# Patient Record
Sex: Female | Born: 1995 | Hispanic: No | Marital: Married | Smoking: Never smoker
Health system: Southern US, Community
[De-identification: ages and names within clinical notes are randomized; demographics above are authoritative.]

## PROBLEM LIST (undated history)

## (undated) ENCOUNTER — Emergency Department (HOSPITAL_COMMUNITY): Admission: EM | Payer: Medicaid Other | Source: Home / Self Care

## (undated) DIAGNOSIS — Z789 Other specified health status: Secondary | ICD-10-CM

## (undated) DIAGNOSIS — K831 Obstruction of bile duct: Secondary | ICD-10-CM

## (undated) DIAGNOSIS — O26643 Intrahepatic cholestasis of pregnancy, third trimester: Secondary | ICD-10-CM

## (undated) DIAGNOSIS — O26613 Liver and biliary tract disorders in pregnancy, third trimester: Secondary | ICD-10-CM

## (undated) HISTORY — PX: FRACTURE SURGERY: SHX138

## (undated) HISTORY — DX: Liver and biliary tract disorders in pregnancy, third trimester: K83.1

## (undated) HISTORY — DX: Liver and biliary tract disorders in pregnancy, third trimester: O26.613

## (undated) HISTORY — PX: HAND SURGERY: SHX662

## (undated) HISTORY — DX: Intrahepatic cholestasis of pregnancy, third trimester: O26.643

## (undated) HISTORY — PX: OTHER SURGICAL HISTORY: SHX169

---

## 2020-05-11 DIAGNOSIS — Z419 Encounter for procedure for purposes other than remedying health state, unspecified: Secondary | ICD-10-CM | POA: Diagnosis not present

## 2020-05-20 ENCOUNTER — Other Ambulatory Visit: Payer: Self-pay

## 2020-05-20 ENCOUNTER — Inpatient Hospital Stay (HOSPITAL_COMMUNITY)
Admission: AD | Admit: 2020-05-20 | Discharge: 2020-05-20 | Disposition: A | Discharge status: Home / Self Care | Payer: Medicaid Other | Attending: Obstetrics and Gynecology | Admitting: Obstetrics and Gynecology

## 2020-05-20 ENCOUNTER — Encounter (HOSPITAL_COMMUNITY): Payer: Self-pay | Admitting: Obstetrics and Gynecology

## 2020-05-20 ENCOUNTER — Encounter: Payer: Self-pay | Admitting: Lactation Services

## 2020-05-20 DIAGNOSIS — R109 Unspecified abdominal pain: Secondary | ICD-10-CM | POA: Diagnosis not present

## 2020-05-20 DIAGNOSIS — R1030 Lower abdominal pain, unspecified: Secondary | ICD-10-CM

## 2020-05-20 DIAGNOSIS — Z3A00 Weeks of gestation of pregnancy not specified: Secondary | ICD-10-CM | POA: Diagnosis not present

## 2020-05-20 DIAGNOSIS — O0930 Supervision of pregnancy with insufficient antenatal care, unspecified trimester: Secondary | ICD-10-CM | POA: Insufficient documentation

## 2020-05-20 DIAGNOSIS — O09299 Supervision of pregnancy with other poor reproductive or obstetric history, unspecified trimester: Secondary | ICD-10-CM | POA: Insufficient documentation

## 2020-05-20 DIAGNOSIS — O26899 Other specified pregnancy related conditions, unspecified trimester: Secondary | ICD-10-CM | POA: Insufficient documentation

## 2020-05-20 HISTORY — DX: Other specified health status: Z78.9

## 2020-05-20 LAB — URINALYSIS, ROUTINE W REFLEX MICROSCOPIC
Bilirubin Urine: NEGATIVE
Glucose, UA: NEGATIVE mg/dL
Hgb urine dipstick: NEGATIVE
Ketones, ur: NEGATIVE mg/dL
Leukocytes,Ua: NEGATIVE
Nitrite: NEGATIVE
Protein, ur: NEGATIVE mg/dL
Specific Gravity, Urine: 1.02 (ref 1.005–1.030)
pH: 7 (ref 5.0–8.0)

## 2020-05-20 LAB — POCT PREGNANCY, URINE: Preg Test, Ur: POSITIVE — AB

## 2020-05-20 NOTE — MAU Provider Note (Signed)
Patient Diana Hopkins is a 24 y.o. [phone]  at unsure LMP for abdominal pain in the suprapubic area and lower back pain.  Patient is a recent immigrant to the [place] from [place]; Urdu interpreter and husband were used as interpreters. She denies vaginal bleeding, decreased fetal movements, LOF, vaginal discharge, contractions.    She reports that she is not sure how far along she is.  Patient and her husband would like a detailed anatomy scan today.  History     CSN: [id number]  Arrival date and time: 05/20/20 1010   Event Date/Time   First Provider Initiated Contact with Patient 05/20/20 1248      Chief Complaint  Patient presents with  . Abdominal Pain    Lower abdomen    Abdominal Pain This is a new problem. The current episode started yesterday. The problem occurs intermittently. The pain is located in the LLQ and RLQ. The pain is at a severity of 4/10. The quality of the pain is cramping. Pertinent negatives include no dysuria.  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 Her miscarriages were at 4 months and 5 months.  The pain lasted last night from 6 to 11 pm; it is much improved today.  OB History    Gravida  6   Para  2   Term  2   Preterm      AB  3   Living  2     SAB  3   IAB      Ectopic      Multiple      Live Births  2           Past Medical History:  Diagnosis Date  . Medical history non-contributory     Past Surgical History:  Procedure Laterality Date  . arm surgery      History reviewed. No pertinent family history.     Allergies: No Known Allergies  No medications prior to admission.    Review of Systems  Constitutional: Negative.   HENT: Negative.   Gastrointestinal: Positive for abdominal pain.  Genitourinary: Negative for dyspareunia, dysuria, vaginal bleeding, vaginal discharge and vaginal pain.  Musculoskeletal: Negative.  Negative for back pain.  Neurological: Negative.    Psychiatric/Behavioral: Negative.    Physical Exam   Blood pressure (!) 94/49, pulse 64, temperature (!) 97.5 F (36.4 C), temperature source Oral, resp. rate 16, height 5\' 2"  (1.575 m), weight 70.9 kg, SpO2 98 %.  Physical Exam Constitutional:      Appearance: She is well-developed.  Cardiovascular:     Rate and Rhythm: Normal rate.  Abdominal:     General: Abdomen is flat.     Palpations: Abdomen is soft.  Genitourinary:    Vagina: Normal.     Cervix: No cervical motion tenderness.  Neurological:     Mental Status: She is alert.   Cervix is long, closed, posterior.   MAU Course  Procedures  MDM -Long conversation with husband and patient with interpreter about normal pain in pregnancy. Explained warning signs to patient and what to expect in pregnancy.  -UA is normal -Patient declined other testing while in MAU  -FHR is 134 by Doppler and cardiac activity was visible on .  Pt informed that the ultrasound is considered a limited OB ultrasound and is not intended to be a complete ultrasound exam.  Patient also informed that the ultrasound is not being completed with the intent of assessing  for fetal or placental anomalies or any pelvic abnormalities.  Explained that the purpose of today's ultrasound is to assess for  viability.  Patient acknowledges the purpose of the exam and the limitations of the study.     Assessment and Plan   1. Lower abdominal pain    2. Patient stable for discharge with plan to keep NOB appointment on Monday. Reviewed that patient will have a female provider for NOB; patient is ok with that as long as no physical contact between MD and patient.   3. Recommended that patient bring all of her documents on Monday to appointment; explained that patient will have her anatomy scan scheduled at that visit.   Diana Hopkins 05/22/2020, 1:12 PM

## 2020-05-20 NOTE — MAU Note (Signed)
PT stated 20 weeks. Found prenatal records which show [facility] 08/23/2020. Did not do NST, but did perform bedside U/S.

## 2020-05-20 NOTE — MAU Note (Signed)
Provider performed bedside U/S.

## 2020-05-20 NOTE — MAU Note (Addendum)
Pt c/o lower abdominal and back pain last night. Pt states she is [redacted] wks pregnant. FHT via doppler - 134. [place] arrived [place] country. Has not established prenatal care. Hx of  3 SAB. States pain is the same as with the miscarriages and around the same time.  Denies bleeding or LOF.  Language barrier d/t lack of interpreter for desired language. Using husband to interpret.

## 2020-05-23 ENCOUNTER — Encounter: Payer: Self-pay | Admitting: Obstetrics and Gynecology

## 2020-05-23 ENCOUNTER — Ambulatory Visit (INDEPENDENT_AMBULATORY_CARE_PROVIDER_SITE_OTHER): Payer: Medicaid Other | Admitting: Obstetrics and Gynecology

## 2020-05-23 ENCOUNTER — Other Ambulatory Visit: Payer: Self-pay

## 2020-05-23 DIAGNOSIS — Z9889 Other specified postprocedural states: Secondary | ICD-10-CM

## 2020-05-23 DIAGNOSIS — O0990 Supervision of high risk pregnancy, unspecified, unspecified trimester: Secondary | ICD-10-CM

## 2020-05-23 DIAGNOSIS — R7612 Nonspecific reaction to cell mediated immunity measurement of gamma interferon antigen response without active tuberculosis: Secondary | ICD-10-CM | POA: Insufficient documentation

## 2020-05-23 NOTE — Patient Instructions (Signed)

## 2020-05-23 NOTE — Progress Notes (Signed)
Subjective:  Diana Hopkins is a 24 y.o. [id number] at [redacted]w[redacted]d being seen today for her first OB appt. EDD by first trimester U/S. Limited prenatal care, immigrant from [place] TSVD x 2 without problems. 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  Patient reports no complaints.  Contractions: Not present. Vag. Bleeding: None.  Movement: (!) Decreased. Denies leaking of fluid.   The following portions of the patient's history were reviewed and updated as appropriate: allergies, current medications, past family history, past medical history, past social history, past surgical history and problem list. Problem list updated.  Objective:   Vitals:   05/23/20 1005  BP: 110/62  Pulse: 85  Weight: 156 lb 6.4 oz (70.9 kg)    Fetal Status: Fetal Heart Rate (bpm): 147   Movement: (!) Decreased     General:  Alert, oriented and cooperative. Patient is in no acute distress.  Skin: Skin is warm and dry. No rash noted.   Cardiovascular: Normal heart rate noted  Respiratory: Normal respiratory effort, no problems with respiration noted  Abdomen: Soft, gravid, appropriate for gestational age. Pain/Pressure: Present     Pelvic:  Cervical exam deferred        Extremities: Normal range of motion.  Edema: Trace  Mental Status: Normal mood and affect. Normal behavior. Normal judgment and thought content.   Urinalysis:      Assessment and Plan:  Pregnancy: [id number] at [redacted]w[redacted]d  1. Supervision of high risk pregnancy, antepartum Prenatal care and labs reviewed with pt. - [place] MFM OB DETAIL +14 WK; Future - CBC/D/Plt+RPR+Rh+ABO+Rub Ab... - Culture, OB Urine - Hemoglobin A1c - CBC - HIV Antibody (routine testing w rflx) - RPR  2. 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 H/O hand surgery Stable  Video interrupter used during today's visit.  Preterm labor symptoms and general obstetric precautions including but not limited to vaginal bleeding, contractions, leaking of fluid and fetal movement were reviewed in detail with the patient. Please refer to After Visit Summary for other counseling recommendations.  Return in about 2 weeks (around 06/06/2020) for OB visit, face to face, any provider, gluocla also.   Hermina Staggers, MD

## 2020-05-24 ENCOUNTER — Other Ambulatory Visit: Payer: Self-pay | Admitting: *Deleted

## 2020-05-24 DIAGNOSIS — O0990 Supervision of high risk pregnancy, unspecified, unspecified trimester: Secondary | ICD-10-CM

## 2020-05-24 LAB — CBC/D/PLT+RPR+RH+ABO+RUB AB...
Antibody Screen: NEGATIVE
Basophils Absolute: 0 10*3/uL (ref 0.0–0.2)
Basos: 1 %
EOS (ABSOLUTE): 0.2 10*3/uL (ref 0.0–0.4)
Eos: 2 %
HCV Ab: <0.1 s/co ratio (ref 0.0–0.9)
HIV Screen 4th Generation wRfx: NONREACTIVE
Hematocrit: 33.3 % — ABNORMAL LOW (ref 34.0–46.6)
Hemoglobin: 11.6 g/dL (ref 11.1–15.9)
Hepatitis B Surface Ag: NEGATIVE
Immature Grans (Abs): 0 10*3/uL (ref 0.0–0.1)
Immature Granulocytes: 1 %
Lymphocytes Absolute: 1.3 10*3/uL (ref 0.7–3.1)
Lymphs: 20 %
MCH: 30.8 pg (ref 26.6–33.0)
MCHC: 34.8 g/dL (ref 31.5–35.7)
MCV: 88 fL (ref 79–97)
Monocytes Absolute: 0.3 10*3/uL (ref 0.1–0.9)
Monocytes: 5 %
Neutrophils Absolute: 4.7 10*3/uL (ref 1.4–7.0)
Neutrophils: 71 %
Platelets: 187 10*3/uL (ref 150–450)
RBC: 3.77 x10E6/uL (ref 3.77–5.28)
RDW: 14.3 % (ref 11.7–15.4)
RPR Ser Ql: NONREACTIVE
Rh Factor: POSITIVE
Rubella Antibodies, IGG: 2.99 index (ref >0.99)
WBC: 6.5 10*3/uL (ref 3.4–10.8)

## 2020-05-24 LAB — HEMOGLOBIN A1C
Est. average glucose Bld gHb Est-mCnc: 91 mg/dL
Hgb A1c MFr Bld: 4.8 % (ref 4.8–5.6)

## 2020-05-24 LAB — HCV INTERPRETATION

## 2020-05-26 LAB — URINE CULTURE, OB REFLEX

## 2020-05-26 LAB — CULTURE, OB URINE

## 2020-05-30 ENCOUNTER — Other Ambulatory Visit: Payer: Self-pay

## 2020-05-30 ENCOUNTER — Encounter: Payer: Self-pay | Admitting: Obstetrics and Gynecology

## 2020-05-30 ENCOUNTER — Other Ambulatory Visit: Payer: Medicaid Other

## 2020-05-30 ENCOUNTER — Telehealth: Payer: Self-pay

## 2020-05-30 DIAGNOSIS — O2343 Unspecified infection of urinary tract in pregnancy, third trimester: Secondary | ICD-10-CM

## 2020-05-30 DIAGNOSIS — O0990 Supervision of high risk pregnancy, unspecified, unspecified trimester: Secondary | ICD-10-CM | POA: Diagnosis not present

## 2020-05-30 DIAGNOSIS — O2340 Unspecified infection of urinary tract in pregnancy, unspecified trimester: Secondary | ICD-10-CM | POA: Insufficient documentation

## 2020-05-30 NOTE — Telephone Encounter (Signed)
Called Pt using [facility] id # [phone], to give pt test results & get Pharmacy information, husband answered, stating will not be back home until another hour. Asked if wife could call us back in the am after 8am. Husband verbalized understanding.

## 2020-05-31 ENCOUNTER — Telehealth: Payer: Self-pay | Admitting: *Deleted

## 2020-05-31 LAB — GLUCOSE TOLERANCE, 2 HOURS W/ 1HR
Glucose, 1 hour: 100 mg/dL (ref 65–179)
Glucose, 2 hour: 85 mg/dL (ref 65–152)
Glucose, Fasting: 70 mg/dL (ref 65–91)

## 2020-05-31 NOTE — Telephone Encounter (Addendum)
Voicemail message left by friend of pt [phone]). She stated that someone from the office had talked with pt's husband yesterday and was supposed to call him back today @ 0800. He has not received the call. She stated we can call him or the pt. I attempted call back to pt with requested service from [facility] interpreter however a [name] was available at the time.   12/22  1610 Called pt w/[employer] interpreter # [id number] and informed her of normal 2hr GTT results. Pt stated that she has increased lower abdominal and back pain. I advised taking Tylenol or using a heating pad to her back. She may discuss further during her next office visit on 12/28. I also advised pt that she may sign a release at her next visit to give [place] permission to speak to her husband about medical information if desired. Pt voiced understanding of all information and instructions given.

## 2020-06-07 ENCOUNTER — Encounter: Payer: Self-pay | Admitting: Obstetrics and Gynecology

## 2020-06-07 ENCOUNTER — Ambulatory Visit (INDEPENDENT_AMBULATORY_CARE_PROVIDER_SITE_OTHER): Payer: Medicaid Other | Admitting: Obstetrics and Gynecology

## 2020-06-07 ENCOUNTER — Other Ambulatory Visit: Payer: Self-pay

## 2020-06-07 VITALS — BP 96/71 | HR 87 | Wt 158.6 lb

## 2020-06-07 DIAGNOSIS — O0933 Supervision of pregnancy with insufficient antenatal care, third trimester: Secondary | ICD-10-CM

## 2020-06-07 DIAGNOSIS — R7612 Nonspecific reaction to cell mediated immunity measurement of gamma interferon antigen response without active tuberculosis: Secondary | ICD-10-CM

## 2020-06-07 DIAGNOSIS — Z3A29 29 weeks gestation of pregnancy: Secondary | ICD-10-CM

## 2020-06-07 DIAGNOSIS — O0990 Supervision of high risk pregnancy, unspecified, unspecified trimester: Secondary | ICD-10-CM

## 2020-06-07 DIAGNOSIS — O2343 Unspecified infection of urinary tract in pregnancy, third trimester: Secondary | ICD-10-CM

## 2020-06-07 DIAGNOSIS — B3731 Acute candidiasis of vulva and vagina: Secondary | ICD-10-CM

## 2020-06-07 DIAGNOSIS — B373 Candidiasis of vulva and vagina: Secondary | ICD-10-CM

## 2020-06-07 MED ORDER — PRENATAL VITAMINS 28-0.8 MG PO TABS: 1.0000 | ORAL_TABLET | Freq: Every day | ORAL | 2 refills | Status: DC

## 2020-06-07 MED ORDER — MICONAZOLE NITRATE 2 % VA CREA: 1.0000 | TOPICAL_CREAM | Freq: Every day | VAGINAL | 0 refills | Status: DC

## 2020-06-07 MED ORDER — CEPHALEXIN 500 MG PO CAPS: 500.0000 mg | ORAL_CAPSULE | Freq: Four times a day (QID) | ORAL | 0 refills | Status: AC

## 2020-06-07 MED ORDER — ACETAMINOPHEN ER 650 MG PO TBCR: 650.0000 mg | EXTENDED_RELEASE_TABLET | Freq: Four times a day (QID) | ORAL | 0 refills | Status: DC | PRN

## 2020-06-07 NOTE — Progress Notes (Signed)
PRENATAL VISIT NOTE  Subjective:  Diana Hopkins is a 24 y.o. [phone] at [redacted]w[redacted]d being seen today for ongoing prenatal care.  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  Patient reports dysuria. Positive urine culture on 12/13 but pt never received treatment. Contractions: Not present. Vag. Bleeding: None.  Movement: Present. Denies leaking of fluid. H/o 2 vaginal deliveries in the past. Prefers female providers. Pt also endorses significant anxiety given h/o 2 prior miscarriages.  The following portions of the patient's history were reviewed and updated as appropriate: allergies, current medications, past family history, past medical history, past social history, past surgical history and problem list.  In person [name] for entirety of clinic visit.  Objective:   Vitals:   06/07/20 1613  BP: 96/71  Pulse: 87  Weight: 158 lb 9.6 oz (71.9 kg)    Fetal Status: Fetal Heart Rate (bpm): 154 Fundal Height: 27 cm Movement: Present     General:  Alert, oriented and cooperative. Patient is in no acute distress.  Skin: Skin is warm and dry. No rash noted.   Cardiovascular: Normal heart rate noted  Respiratory: Normal respiratory effort, no problems with respiration noted  Abdomen: Soft, gravid, appropriate for gestational age.  Pain/Pressure: Present     Pelvic: Cervical exam deferred        Extremities: Normal range of motion.  Edema: Trace  Mental Status: Normal mood and affect. Normal behavior. Normal judgment and thought content.   Assessment and Plan:  Pregnancy: [id number] at [redacted]w[redacted]d 1. [redacted] weeks gestation of pregnancy, Supervision of High-Risk Pregnancy: No red flag signs/symptoms. +FHT and +FM today. S/p  Tdap and flu vaccines. Normal 2hr gtt and third trimester labs.  - plan for ultrasound on 06/21/20 (pt aware and discussed with interpreter today) - provided reassurance given h/o 2 miscarriages and reported anxiety regarding baby's wellbeing - provided information re: kick counts given prior MAU visit for decreased fetal movement - rtc in 2 week for f/u prenatal appt or sooner as needed for return precautions as noted below  2. Urinary tract infection in mother during third trimester of pregnancy: Pt with positive urine culture (pan-sensitive staph epi) on 05/23/20. Report of worsening dysuria today. No fever, back pain or other systemic symptoms. Message sent to nursing, but pt never treated. - script for keflex 500mg  QID x5d - plan for TOC 1 week s/p completion of abx course  3. 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 No concerning signs/symptoms today.  4. Late prenatal care  Refugee status: Presented in third trimester given recent immigration to the from [place]. Immigrated with her husband. [name] present for clinic visit today.  Preterm labor symptoms and general obstetric precautions including but not limited to vaginal bleeding, contractions, leaking of fluid and fetal movement were reviewed in detail with the patient. Please refer to After Visit Summary for other counseling recommendations.   Return in about 2 weeks (around 06/21/2020) for f/u prenatal appt in person (prefers female provider).  Future Appointments  Date Time Provider Department Center  06/21/2020  8:45 AM [facility] NURSE [facility]  06/21/2020  9:00 AM [facility] US1 [facility]  06/21/2020 10:35 AM 08/19/2020, Magnus Sinning, PA-C [facility]   Diana Hopkins, [facility] MD OB Fellow, Faculty Practice  06/08/2020 3:14 PM

## 2020-06-07 NOTE — Patient Instructions (Addendum)
Take keflex (cephalaxin 1 tablet every 6 hours for 5 days).  Third Trimester of Pregnancy The third trimester is from week 28 through week 40 (months 7 through 9). The third trimester is a time when the unborn baby (fetus) is growing rapidly. At the end of the ninth month, the fetus is about 20 inches in length and weighs 6-10 pounds. Body changes during your third trimester Your body will continue to go through many changes during pregnancy. The changes vary from woman to woman. During the third trimester:  Your weight will continue to increase. You can expect to gain 25-35 pounds (11-16 kg) by the end of the pregnancy.  You may begin to get stretch marks on your hips, abdomen, and breasts.  You may urinate more often because the fetus is moving lower into your pelvis and pressing on your bladder.  You may develop or continue to have heartburn. This is caused by increased hormones that slow down muscles in the digestive tract.  You may develop or continue to have constipation because increased hormones slow digestion and cause the muscles that push waste through your intestines to relax.  You may develop hemorrhoids. These are swollen veins (varicose veins) in the rectum that can itch or be painful.  You may develop swollen, bulging veins (varicose veins) in your legs.  You may have increased body aches in the pelvis, back, or thighs. This is due to weight gain and increased hormones that are relaxing your joints.  You may have changes in your hair. These can include thickening of your hair, rapid growth, and changes in texture. Some women also have hair loss during or after pregnancy, or hair that feels dry or thin. Your hair will most likely return to normal after your baby is born.  Your breasts will continue to grow and they will continue to become tender. A yellow fluid (colostrum) may leak from your breasts. This is the first milk you are producing for your baby.  Your belly button  may stick out.  You may notice more swelling in your hands, face, or ankles.  You may have increased tingling or numbness in your hands, arms, and legs. The skin on your belly may also feel numb.  You may feel short of breath because of your expanding uterus.  You may have more problems sleeping. This can be caused by the size of your belly, increased need to urinate, and an increase in your body's metabolism.  You may notice the fetus "dropping," or moving lower in your abdomen (lightening).  You may have increased vaginal discharge.  You may notice your joints feel loose and you may have pain around your pelvic bone. What to expect at prenatal visits You will have prenatal exams every 2 weeks until week 36. Then you will have weekly prenatal exams. During a routine prenatal visit:  You will be weighed to make sure you and the baby are growing normally.  Your blood pressure will be taken.  Your abdomen will be measured to track your baby's growth.  The fetal heartbeat will be listened to.  Any test results from the previous visit will be discussed.  You may have a cervical check near your due date to see if your cervix has softened or thinned (effaced).  You will be tested for Group B streptococcus. This happens between 35 and 37 weeks. Your health care provider may ask you:  What your birth plan is.  How you are feeling.  If you  are feeling the baby move.  If you have had any abnormal symptoms, such as leaking fluid, bleeding, severe headaches, or abdominal cramping.  If you are using any tobacco products, including cigarettes, chewing tobacco, and electronic cigarettes.  If you have any questions. Other tests or screenings that may be performed during your third trimester include:  Blood tests that check for low iron levels (anemia).  Fetal testing to check the health, activity level, and growth of the fetus. Testing is done if you have certain medical conditions or  if there are problems during the pregnancy.  Nonstress test (NST). This test checks the health of your baby to make sure there are no signs of problems, such as the baby not getting enough oxygen. During this test, a belt is placed around your belly. The baby is made to move, and its heart rate is monitored during movement. What is false labor? False labor is a condition in which you feel small, irregular tightenings of the muscles in the womb (contractions) that usually go away with rest, changing position, or drinking water. These are called Braxton Hicks contractions. Contractions may last for hours, days, or even weeks before true labor sets in. If contractions come at regular intervals, become more frequent, increase in intensity, or become painful, you should see your health care provider. What are the signs of labor?  Abdominal cramps.  Regular contractions that start at 10 minutes apart and become stronger and more frequent with time.  Contractions that start on the top of the uterus and spread down to the lower abdomen and back.  Increased pelvic pressure and dull back pain.  A watery or bloody mucus discharge that comes from the vagina.  Leaking of amniotic fluid. This is also known as your "water breaking." It could be a slow trickle or a gush. Let your health care provider know if it has a color or strange odor. If you have any of these signs, call your health care provider right away, even if it is before your due date. Follow these instructions at home: Medicines  Follow your health care provider's instructions regarding medicine use. Specific medicines may be either safe or unsafe to take during pregnancy.  Take a prenatal vitamin that contains at least 600 micrograms (mcg) of folic acid.  If you develop constipation, try taking a stool softener if your health care provider approves. Eating and drinking   Eat a balanced diet that includes fresh fruits and vegetables,  whole grains, good sources of protein such as meat, eggs, or tofu, and low-fat dairy. Your health care provider will help you determine the amount of weight gain that is right for you.  Avoid raw meat and uncooked cheese. These carry germs that can cause birth defects in the baby.  If you have low calcium intake from food, talk to your health care provider about whether you should take a daily calcium supplement.  Eat four or five small meals rather than three large meals a day.  Limit foods that are high in fat and processed sugars, such as fried and sweet foods.  To prevent constipation: ? Drink enough fluid to keep your urine clear or pale yellow. ? Eat foods that are high in fiber, such as fresh fruits and vegetables, whole grains, and beans. Activity  Exercise only as directed by your health care provider. Most women can continue their usual exercise routine during pregnancy. Try to exercise for 30 minutes at least 5 days a week. Stop  exercising if you experience uterine contractions.  Avoid heavy lifting.  Do not exercise in extreme heat or humidity, or at high altitudes.  Wear low-heel, comfortable shoes.  Practice good posture.  You may continue to have sex unless your health care provider tells you otherwise. Relieving pain and discomfort  Take frequent breaks and rest with your legs elevated if you have leg cramps or low back pain.  Take warm sitz baths to soothe any pain or discomfort caused by hemorrhoids. Use hemorrhoid cream if your health care provider approves.  Wear a good support bra to prevent discomfort from breast tenderness.  If you develop varicose veins: ? Wear support pantyhose or compression stockings as told by your healthcare provider. ? Elevate your feet for 15 minutes, 3-4 times a day. Prenatal care  Write down your questions. Take them to your prenatal visits.  Keep all your prenatal visits as told by your health care provider. This is  important. Safety  Wear your seat belt at all times when driving.  Make a list of emergency phone numbers, including numbers for family, friends, the hospital, and police and fire departments. General instructions  Avoid cat litter boxes and soil used by cats. These carry germs that can cause birth defects in the baby. If you have a cat, ask someone to clean the litter box for you.  Do not travel far distances unless it is absolutely necessary and only with the approval of your health care provider.  Do not use hot tubs, steam rooms, or saunas.  Do not drink alcohol.  Do not use any products that contain nicotine or tobacco, such as cigarettes and e-cigarettes. If you need help quitting, ask your health care provider.  Do not use any medicinal herbs or unprescribed drugs. These chemicals affect the formation and growth of the baby.  Do not douche or use tampons or scented sanitary pads.  Do not cross your legs for long periods of time.  To prepare for the arrival of your baby: ? Take prenatal classes to understand, practice, and ask questions about labor and delivery. ? Make a trial run to the hospital. ? Visit the hospital and tour the maternity area. ? Arrange for maternity or paternity leave through employers. ? Arrange for family and friends to take care of pets while you are in the hospital. ? Purchase a rear-facing car seat and make sure you know how to install it in your car. ? Pack your hospital bag. ? Prepare the baby's nursery. Make sure to remove all pillows and stuffed animals from the baby's crib to prevent suffocation.  Visit your dentist if you have not gone during your pregnancy. Use a soft toothbrush to brush your teeth and be gentle when you floss. Contact a health care provider if:  You are unsure if you are in labor or if your water has broken.  You become dizzy.  You have mild pelvic cramps, pelvic pressure, or nagging pain in your abdominal area.  You  have lower back pain.  You have persistent nausea, vomiting, or diarrhea.  You have an unusual or bad smelling vaginal discharge.  You have pain when you urinate. Get help right away if:  Your water breaks before 37 weeks.  You have regular contractions less than 5 minutes apart before 37 weeks.  You have a fever.  You are leaking fluid from your vagina.  You have spotting or bleeding from your vagina.  You have severe abdominal pain or cramping.  You have rapid weight loss or weight gain.  You have shortness of breath with chest pain.  You notice sudden or extreme swelling of your face, hands, ankles, feet, or legs.  Your baby makes fewer than 10 movements in 2 hours.  You have severe headaches that do not go away when you take medicine.  You have vision changes. Summary  The third trimester is from week 28 through week 40, months 7 through 9. The third trimester is a time when the unborn baby (fetus) is growing rapidly.  During the third trimester, your discomfort may increase as you and your baby continue to gain weight. You may have abdominal, leg, and back pain, sleeping problems, and an increased need to urinate.  During the third trimester your breasts will keep growing and they will continue to become tender. A yellow fluid (colostrum) may leak from your breasts. This is the first milk you are producing for your baby.  False labor is a condition in which you feel small, irregular tightenings of the muscles in the womb (contractions) that eventually go away. These are called Braxton Hicks contractions. Contractions may last for hours, days, or even weeks before true labor sets in.  Signs of labor can include: abdominal cramps; regular contractions that start at 10 minutes apart and become stronger and more frequent with time; watery or bloody mucus discharge that comes from the vagina; increased pelvic pressure and dull back pain; and leaking of amniotic fluid. This  information is not intended to replace advice given to you by your health care provider. Make sure you discuss any questions you have with your health care provider. Document Revised: 09/18/2018 Document Reviewed: 07/03/2016 Elsevier Patient Education  2020 [employer].

## 2020-06-08 DIAGNOSIS — O0932 Supervision of pregnancy with insufficient antenatal care, second trimester: Secondary | ICD-10-CM | POA: Insufficient documentation

## 2020-06-11 DIAGNOSIS — Z419 Encounter for procedure for purposes other than remedying health state, unspecified: Secondary | ICD-10-CM | POA: Diagnosis not present

## 2020-06-11 NOTE — L&D Delivery Note (Signed)
Delivery Note Called to bedside and patient complete and pushing. At 7:04 AM a viable female was delivered via Vaginal, Spontaneous (Presentation: Left Occiput Anterior).  APGAR: 8, 9; weight pending. No nuchal cord noted Placenta status: Spontaneous, Intact.  Cord: 3 vessels with the following complications: None.   Anesthesia: Epidural Episiotomy: None Lacerations: 2nd degree Suture Repair: 3.0 vicryl Est. Blood Loss (mL): 142  Mom to postpartum.  Baby to Couplet care / Skin to Skin.  Diana Hopkins 09/30/2020, 8:09 AM

## 2020-06-21 ENCOUNTER — Encounter: Payer: Self-pay | Admitting: *Deleted

## 2020-06-21 ENCOUNTER — Other Ambulatory Visit: Payer: Self-pay

## 2020-06-21 ENCOUNTER — Other Ambulatory Visit: Payer: Self-pay | Admitting: *Deleted

## 2020-06-21 ENCOUNTER — Encounter: Payer: Self-pay | Admitting: Medical

## 2020-06-21 ENCOUNTER — Ambulatory Visit (INDEPENDENT_AMBULATORY_CARE_PROVIDER_SITE_OTHER): Payer: Medicaid Other | Admitting: Medical

## 2020-06-21 ENCOUNTER — Ambulatory Visit: Payer: Medicaid Other | Admitting: *Deleted

## 2020-06-21 ENCOUNTER — Ambulatory Visit: Payer: Medicaid Other | Attending: Obstetrics and Gynecology

## 2020-06-21 VITALS — BP 93/48 | HR 72 | Wt 160.7 lb

## 2020-06-21 DIAGNOSIS — M549 Dorsalgia, unspecified: Secondary | ICD-10-CM

## 2020-06-21 DIAGNOSIS — O0933 Supervision of pregnancy with insufficient antenatal care, third trimester: Secondary | ICD-10-CM

## 2020-06-21 DIAGNOSIS — O99891 Other specified diseases and conditions complicating pregnancy: Secondary | ICD-10-CM

## 2020-06-21 DIAGNOSIS — O0990 Supervision of high risk pregnancy, unspecified, unspecified trimester: Secondary | ICD-10-CM | POA: Diagnosis not present

## 2020-06-21 DIAGNOSIS — L299 Pruritus, unspecified: Secondary | ICD-10-CM

## 2020-06-21 DIAGNOSIS — Z3A22 22 weeks gestation of pregnancy: Secondary | ICD-10-CM

## 2020-06-21 DIAGNOSIS — O2343 Unspecified infection of urinary tract in pregnancy, third trimester: Secondary | ICD-10-CM

## 2020-06-21 DIAGNOSIS — Z3492 Encounter for supervision of normal pregnancy, unspecified, second trimester: Secondary | ICD-10-CM

## 2020-06-21 IMAGING — US US MFM OB DETAIL+14 WK
1 series · 13 of 28 positions shown · non-contrast
Comparison: none

[Series 1: us mfm ob detail+14 wk · 59 acquisitions, 13 frames shown]
[im 3/59]
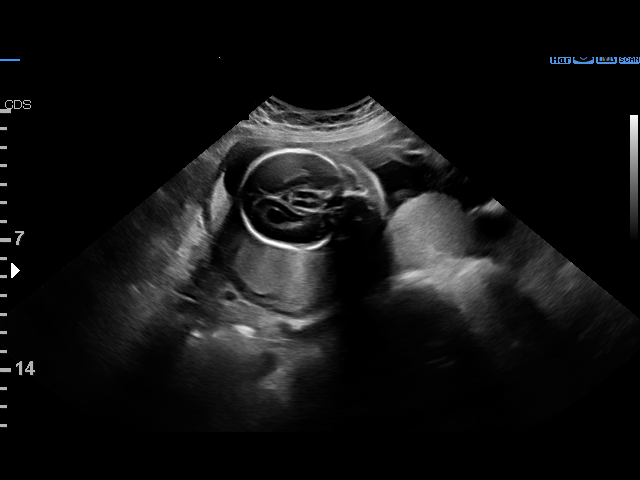
[im 7/59]
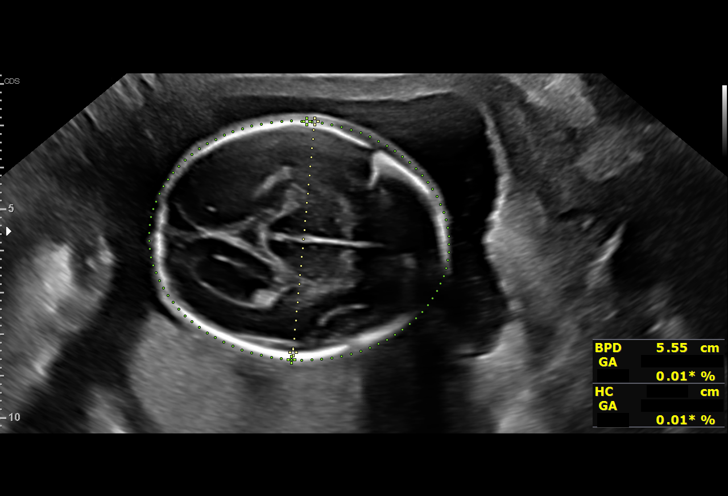
[im 11/59]
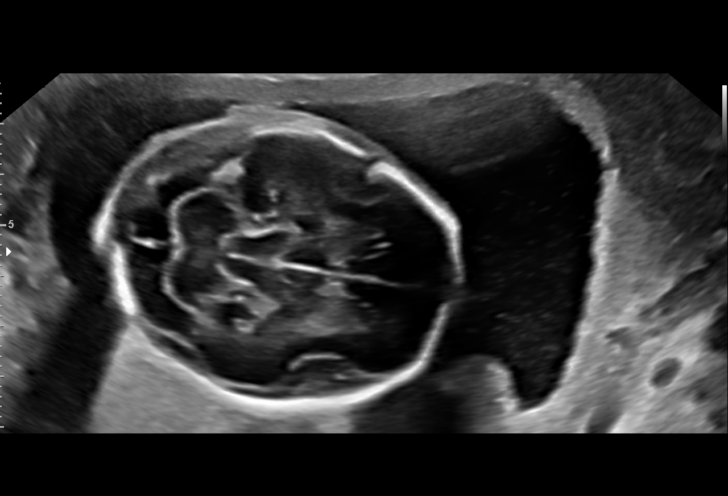
[im 16/59]
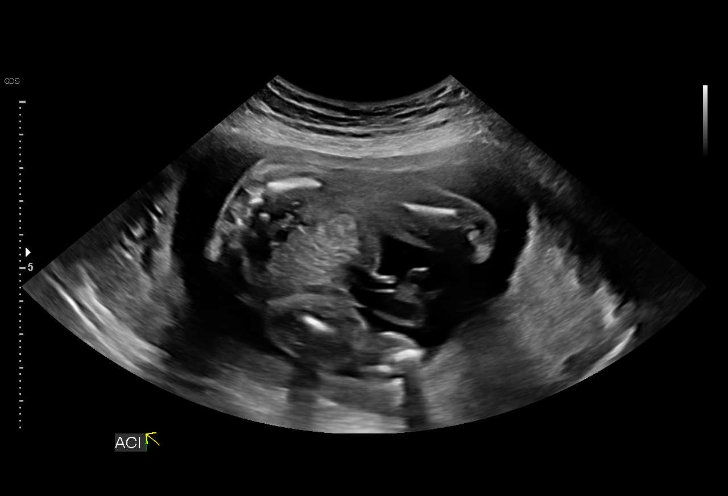
[im 20/59]
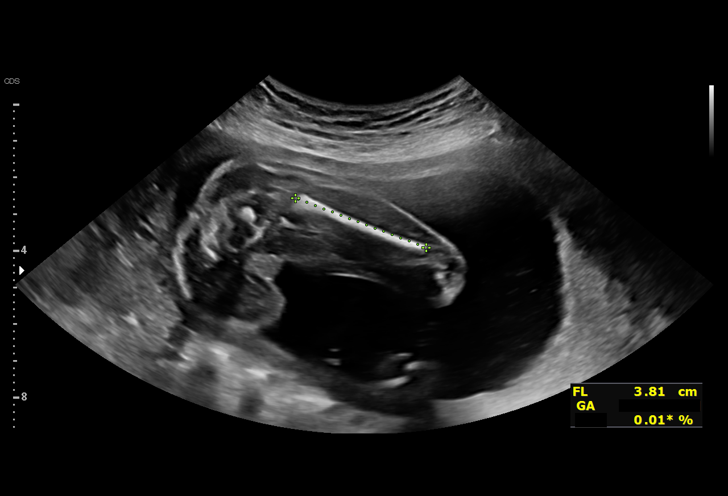
[im 24/59]
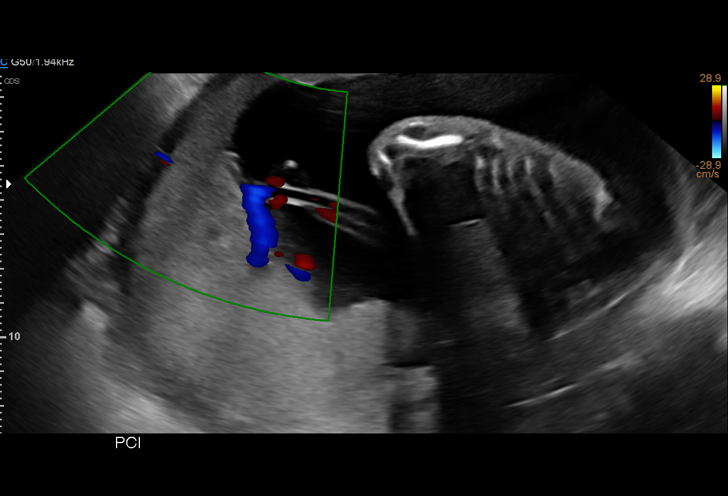
[im 31/59]
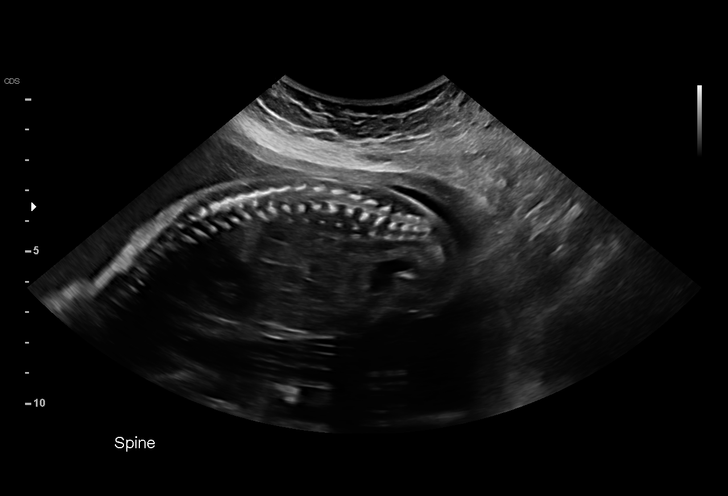
[im 35/59]
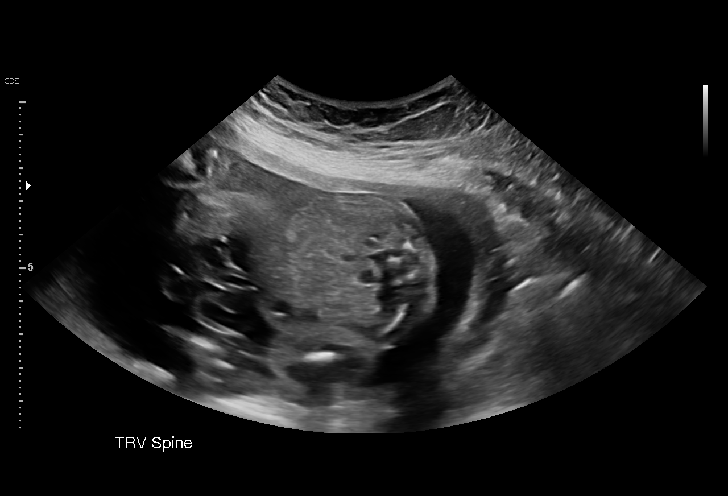
[im 39/59]
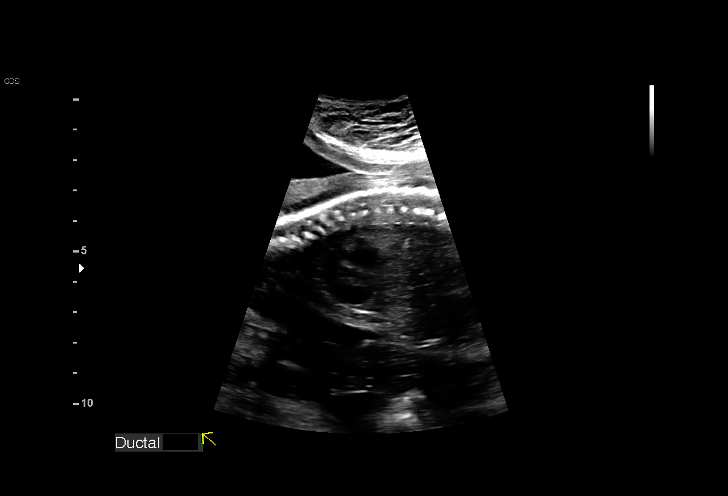
[im 43/59]
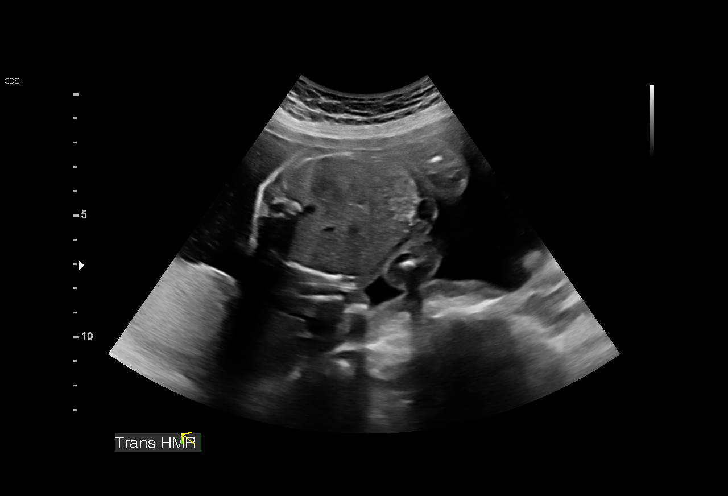
[im 48/59]
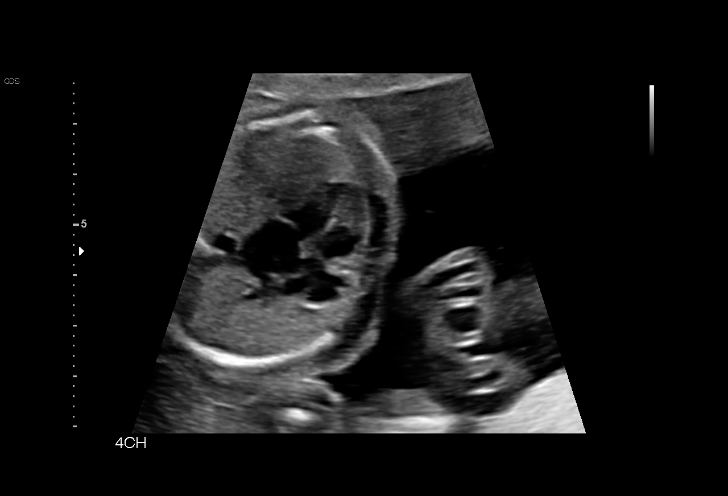
[im 52/59]
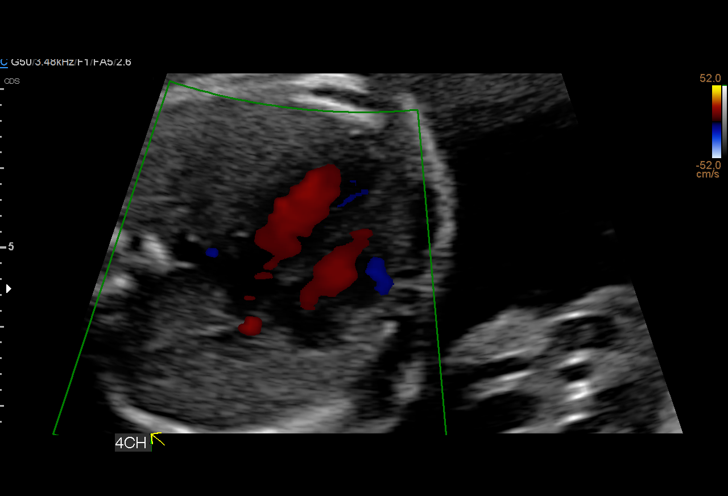
[im 56/59]
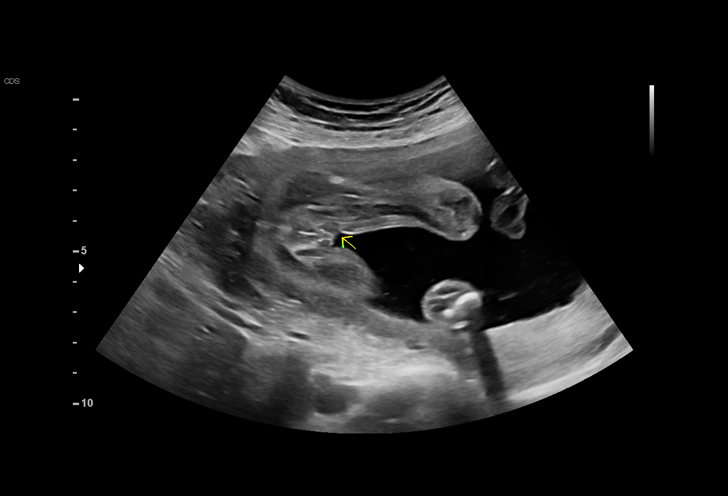

[13 of 28 positions shown; findings below may reference images not displayed]

Indications

 Fetal abnormality - other known or suspected   [GU]
 Pregnancy with inconclusive fetal viability    [GU]
 22 weeks gestation of pregnancy
Fetal Evaluation

 Num Of Fetuses:         1
 Fetal Heart Rate(bpm):  136
 Cardiac Activity:       Observed
 Presentation:           Transverse, head to maternal right
 Placenta:               Posterior
 P. Cord Insertion:      Visualized, central

 Amniotic Fluid
 AFI FV:      Within normal limits

 AFI Sum(cm)     %Tile       Largest Pocket(cm)
 16.2            60

 RUQ(cm)       RLQ(cm)       LUQ(cm)        LLQ(cm)

Biometry

 BPD:      55.5  mm     G. Age:  22w 6d         54  %    CI:        72.94   %    70 - 86
                                                         FL/HC:      18.2   %    19.2 -
 HC:      206.6  mm     G. Age:  22w 5d         38  %    HC/AC:      1.09        1.05 -
 AC:      190.1  mm     G. Age:  23w 5d         75  %    FL/BPD:     67.9   %    71 - 87
 FL:       37.7  mm     G. Age:  22w 0d         19  %    FL/AC:      19.8   %    20 - 24
 CER:      23.6  mm     G. Age:  21w 6d         47  %

 LV:        7.6  mm
 CM:        7.3  mm

 Est. FW:     552  gm      1 lb 3 oz     57  %
OB History

 Gravidity:    6         Term:   2         SAB:   3
 Living:       2
Gestational Age

 LMP:           22w 5d        Date:  [DATE]                 EDD:   [DATE]
 U/S Today:     22w 6d                                        EDD:   [DATE]
 Best:          22w 5d     Det. By:  LMP  ([DATE])          EDD:   [DATE]
Anatomy

 Cranium:               Appears normal         Aortic Arch:            Appears normal
 Cavum:                 Appears normal         Ductal Arch:            Not well visualized
 Ventricles:            Appears normal         Diaphragm:              Appears normal
 Choroid Plexus:        Appears normal         Stomach:                Appears normal, left
                                                                       sided
 Cerebellum:            Appears normal         Abdomen:                Appears normal
 Posterior Fossa:       Not well visualized    Abdominal Wall:         Appears nml (cord
                                                                       insert, abd wall)
 Nuchal Fold:           Not well visualized    Cord Vessels:           Appears normal (3
                                                                       vessel cord)
 Face:                  Orbits appear          Kidneys:                Appear normal
                        normal; need profile
 Lips:                  Not well visualized    Bladder:                Appears normal
 Thoracic:              Appears normal         Spine:                  Appears normal
 Heart:                 Appears normal; EIF    Upper Extremities:      Not well visualized
 RVOT:                  Previously seen        Lower Extremities:      Not well visualized
 LVOT:                  Appears normal
Cervix Uterus Adnexa

 Cervix
 Length:           3.92  cm.
 Normal appearance by transabdominal scan.

 Uterus
 No abnormality visualized.
Comments

 This patient was seen for a detailed fetal anatomy scan.  The
 patient is a refugee from [place].  Based on the notes
 from her prenatal visits in [REDACTED], she is supposed to be 31
 weeks pregnant today.  The patient's husband showed me a
 handwritten ultrasound report indicating an EDC [DATE] that was performed in the first trimester when she first
 entered the [place].  He believes that the ultrasound
 was performed somewhere in [place].  The EDC obtained
 from that ultrasound would also be consistent with the EDC
 based on her last menstrual period [DATE].
 The patient reports that she has been experiencing burning
 and itching on the soles of her feet.
 She denies any significant past medical history and denies
 any problems in her current pregnancy.  She reports two full-
 term vaginal deliveries.
 She has not had any screening tests for fetal aneuploidy
 drawn in her current pregnancy.
 Based on the fetal biometry measurements obtained today,
 her EDC was changed to [DATE], making her 22
 weeks and 5 days pregnant today.  This EDC would be
 consistent with the first trimester ultrasound that she had
 earlier in her pregnancy.
 The views of the fetal anatomy were limited today due to the
 fetal position.  Due to her religious beliefs, I was also not
 allowed to scan her myself.
 The patient was informed that anomalies may be missed due
 to technical limitations. If the fetus is in a suboptimal position
 or maternal habitus is increased, visualization of the fetus in
 the maternal uterus may be impaired.
 She should be worked up for cholestasis of pregnancy at her
 next prenatal visit due to her complaints of itching on the
 soles of her feet.  Her feet should also be examined to rule
 out athlete's foot or scabies.
 A follow-up exam was scheduled in 4 weeks to confirm her
 dates and to complete the views of the fetal anatomy.
 All conversations were held with the patient today with the
 help of a Afghani interpreter.

## 2020-06-21 NOTE — Progress Notes (Signed)
   PRENATAL VISIT NOTE  Subjective:  Diana Hopkins is a 25 y.o. [phone] at 70w0dbeing seen today for ongoing prenatal care.  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  Patient reports itching of head and feet, back pain with activity.  Contractions: Irritability. Vag. Bleeding: None.  Movement: Present. Denies leaking of fluid.   The following portions of the patient's history were reviewed and updated as appropriate: allergies, current medications, past family history, past medical history, past social history, past surgical history and problem list.   Objective:   Vitals:   06/21/20 1132  BP: (!) 93/48  Pulse: 72  Weight: 160 lb 11.2 oz (72.9 kg)    Fetal Status: Fetal Heart Rate (bpm): 150   Movement: Present     General:  Alert, oriented and cooperative. Patient is in no acute distress.  Skin: Skin is warm and dry. No rash noted.   Cardiovascular: Normal heart rate noted  Respiratory: Normal respiratory effort, no problems with respiration noted  Abdomen: Soft, gravid, appropriate for gestational age.  Pain/Pressure: Absent     Pelvic: Cervical exam deferred        Extremities: Normal range of motion.  Edema: Trace  Mental Status: Normal mood and affect. Normal behavior. Normal judgment and thought content.   Assessment and Plan:  Pregnancy: [id number] 243w0d. Supervision of high risk pregnancy, antepartum - Planing to breastfeed - MOC, plans for NFP - Due date changed to 10/20/20 based on today's [place]. Late prenatal care affecting pregnancy in third trimester  3. Urinary tract infection in mother during third trimester of pregnancy  4. Back pain affecting pregnancy  in third trimester - Recommended abdominal binder, limiting heavy lifting and Tylenol   5. Itching - Comp Met (CMET) - Bile acids, total  6. [redacted] weeks gestation of pregnancy  Preterm labor symptoms and general obstetric precautions including but not limited to vaginal bleeding, contractions, leaking of fluid and fetal movement were reviewed in detail with the patient. Please refer to After Visit Summary for other counseling recommendations.   Return in about 4 weeks (around 07/19/2020) for LOB, In-Person, any provider.  Future Appointments  Date Time Provider [place]2/01/2021  7:15 AM [facility]  7:30 AM [facility]  JuKerry HoughPA-C

## 2020-06-21 NOTE — Patient Instructions (Addendum)

## 2020-06-21 NOTE — Progress Notes (Signed)
MFM Note  This patient was seen for a detailed fetal anatomy scan.  The patient is a refugee from [place].  Based on the notes from her prenatal visits in Epic, she is supposed to be [redacted] weeks pregnant today.  The patient's husband showed me a handwritten ultrasound report indicating an [facility] of Oct 23, 2020 that was performed in the first trimester when she first entered the [place].  He believes that the ultrasound was performed somewhere in [place].  The [facility] obtained from that ultrasound would also be consistent with the [facility] based on her last menstrual period of January 14, 2020.   The patient reports that she has been experiencing burning and itching on the soles of her feet.  She denies any significant past medical history and denies any problems in her current pregnancy.  She reports two full-term vaginal deliveries.  She has not had any screening tests for fetal aneuploidy drawn in her current pregnancy.  Based on the fetal biometry measurements obtained today, her EDC was changed to Oct 20, 2020, making her 22 weeks and 5 days pregnant today.  This EDC would be consistent with the first trimester ultrasound that she had earlier in her pregnancy.  The views of the fetal anatomy were limited today due to the fetal position.  Due to her religious beliefs, I was also not allowed to scan her myself.  The patient was informed that anomalies may be missed due to technical limitations. If the fetus is in a suboptimal position or maternal habitus is increased, visualization of the fetus in the maternal uterus may be impaired.  She should be worked up for cholestasis of pregnancy at her next prenatal visit due to her complaints of itching on the soles of her feet.  Her feet should also be examined to rule out athlete's foot or scabies.  A follow-up exam was scheduled in 4 weeks to confirm her dates and to complete the views of the fetal anatomy.    All conversations were held with the patient  today with the help of a Afghani interpreter.

## 2020-06-23 LAB — COMPREHENSIVE METABOLIC PANEL
ALT: 16 IU/L (ref 0–32)
AST: 15 IU/L (ref 0–40)
Albumin/Globulin Ratio: 1.6 (ref 1.2–2.2)
Albumin: 3.9 g/dL (ref 3.9–5.0)
Alkaline Phosphatase: 91 IU/L (ref 44–121)
BUN/Creatinine Ratio: 16 (ref 9–23)
BUN: 7 mg/dL (ref 6–20)
Bilirubin Total: <0.2 mg/dL (ref 0.0–1.2)
CO2: 19 mmol/L — ABNORMAL LOW (ref 20–29)
Calcium: 9.1 mg/dL (ref 8.7–10.2)
Chloride: 103 mmol/L (ref 96–106)
Creatinine, Ser: 0.44 mg/dL — ABNORMAL LOW (ref 0.57–1.00)
GFR calc Af Amer: 162 mL/min/{1.73_m2} (ref >59)
GFR calc non Af Amer: 141 mL/min/{1.73_m2} (ref >59)
Globulin, Total: 2.5 g/dL (ref 1.5–4.5)
Glucose: 76 mg/dL (ref 65–99)
Potassium: 4.3 mmol/L (ref 3.5–5.2)
Sodium: 136 mmol/L (ref 134–144)
Total Protein: 6.4 g/dL (ref 6.0–8.5)

## 2020-06-23 LAB — BILE ACIDS, TOTAL: Bile Acids Total: 4.6 umol/L (ref 0.0–10.0)

## 2020-07-12 DIAGNOSIS — Z419 Encounter for procedure for purposes other than remedying health state, unspecified: Secondary | ICD-10-CM | POA: Diagnosis not present

## 2020-07-19 ENCOUNTER — Other Ambulatory Visit: Payer: Self-pay | Admitting: *Deleted

## 2020-07-19 ENCOUNTER — Ambulatory Visit: Payer: Medicaid Other | Attending: Obstetrics and Gynecology

## 2020-07-19 ENCOUNTER — Ambulatory Visit (INDEPENDENT_AMBULATORY_CARE_PROVIDER_SITE_OTHER): Payer: Medicaid Other | Admitting: Student

## 2020-07-19 ENCOUNTER — Other Ambulatory Visit: Payer: Self-pay

## 2020-07-19 ENCOUNTER — Encounter: Payer: Self-pay | Admitting: *Deleted

## 2020-07-19 ENCOUNTER — Ambulatory Visit: Payer: Medicaid Other | Admitting: *Deleted

## 2020-07-19 ENCOUNTER — Encounter: Payer: Self-pay | Admitting: Student

## 2020-07-19 VITALS — BP 97/54 | HR 77 | Wt 164.9 lb

## 2020-07-19 DIAGNOSIS — Z3687 Encounter for antenatal screening for uncertain dates: Secondary | ICD-10-CM | POA: Diagnosis not present

## 2020-07-19 DIAGNOSIS — O321XX0 Maternal care for breech presentation, not applicable or unspecified: Secondary | ICD-10-CM | POA: Diagnosis not present

## 2020-07-19 DIAGNOSIS — Z3A26 26 weeks gestation of pregnancy: Secondary | ICD-10-CM | POA: Diagnosis not present

## 2020-07-19 DIAGNOSIS — Z362 Encounter for other antenatal screening follow-up: Secondary | ICD-10-CM | POA: Insufficient documentation

## 2020-07-19 DIAGNOSIS — Z9889 Other specified postprocedural states: Secondary | ICD-10-CM

## 2020-07-19 DIAGNOSIS — O0990 Supervision of high risk pregnancy, unspecified, unspecified trimester: Secondary | ICD-10-CM

## 2020-07-19 DIAGNOSIS — O0932 Supervision of pregnancy with insufficient antenatal care, second trimester: Secondary | ICD-10-CM

## 2020-07-19 DIAGNOSIS — G479 Sleep disorder, unspecified: Secondary | ICD-10-CM

## 2020-07-19 DIAGNOSIS — O3680X0 Pregnancy with inconclusive fetal viability, not applicable or unspecified: Secondary | ICD-10-CM

## 2020-07-19 DIAGNOSIS — Z3492 Encounter for supervision of normal pregnancy, unspecified, second trimester: Secondary | ICD-10-CM

## 2020-07-19 DIAGNOSIS — O09292 Supervision of pregnancy with other poor reproductive or obstetric history, second trimester: Secondary | ICD-10-CM

## 2020-07-19 DIAGNOSIS — R4589 Other symptoms and signs involving emotional state: Secondary | ICD-10-CM

## 2020-07-19 DIAGNOSIS — L299 Pruritus, unspecified: Secondary | ICD-10-CM

## 2020-07-19 IMAGING — US US MFM OB FOLLOW-UP
1 series · 13 of 28 positions shown · non-contrast
Comparison: none

[Series 1: us mfm ob follow-up · 55 acquisitions, 13 frames shown]
[im 3/55]
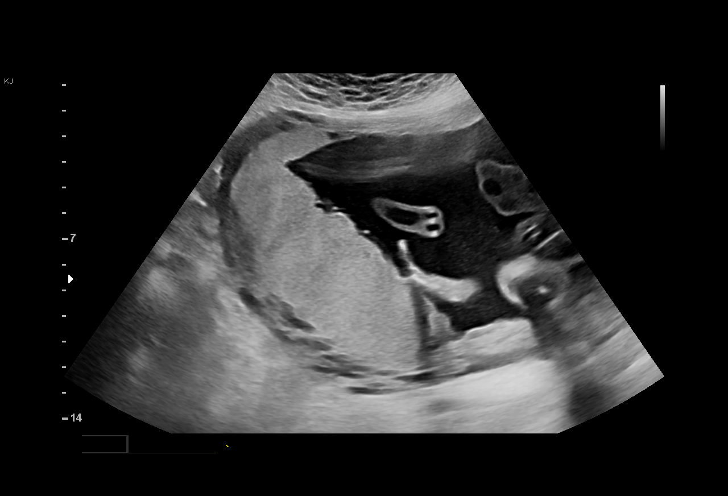
[im 7/55]
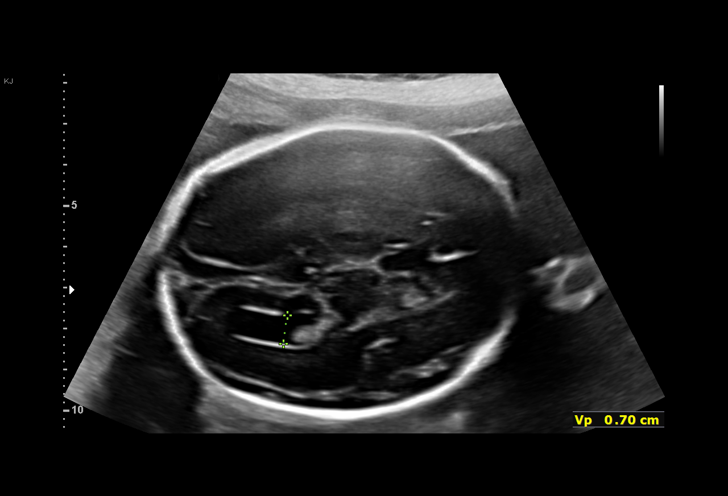
[im 11/55]
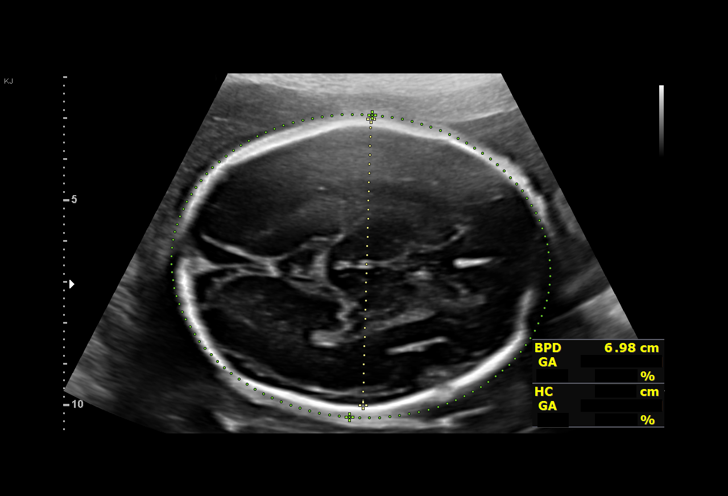
[im 15/55]
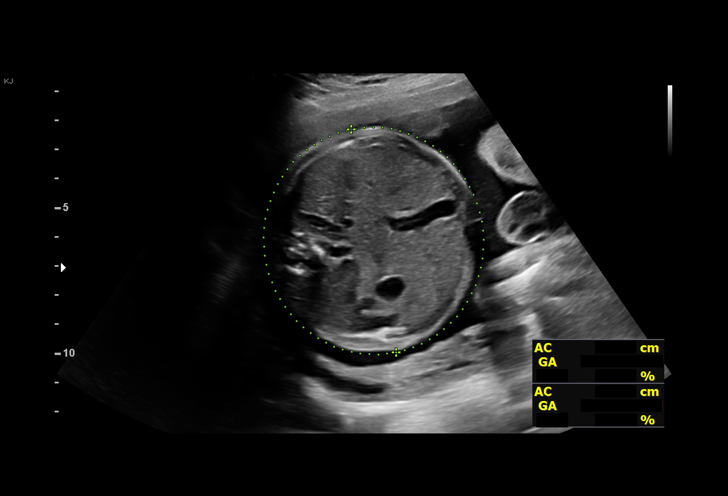
[im 19/55]
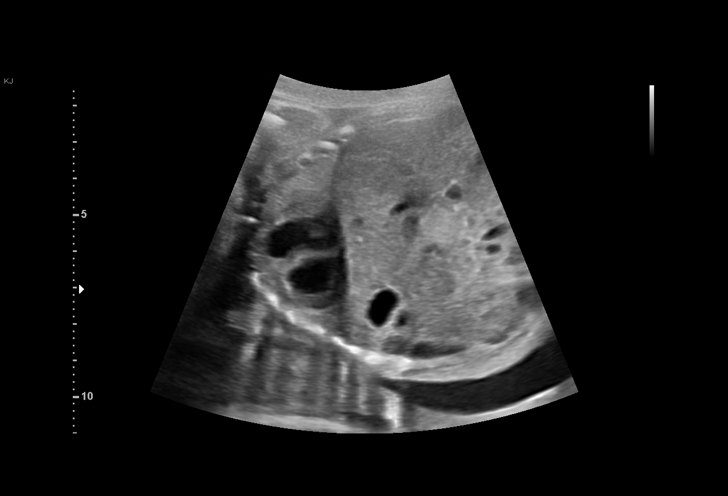
[im 23/55]
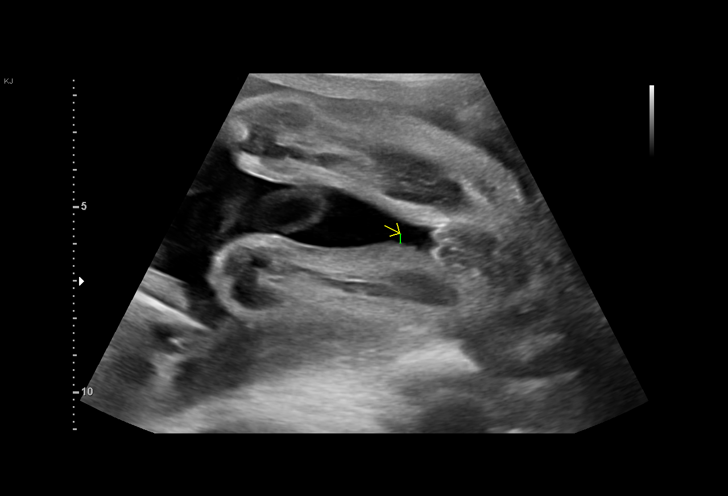
[im 29/55]
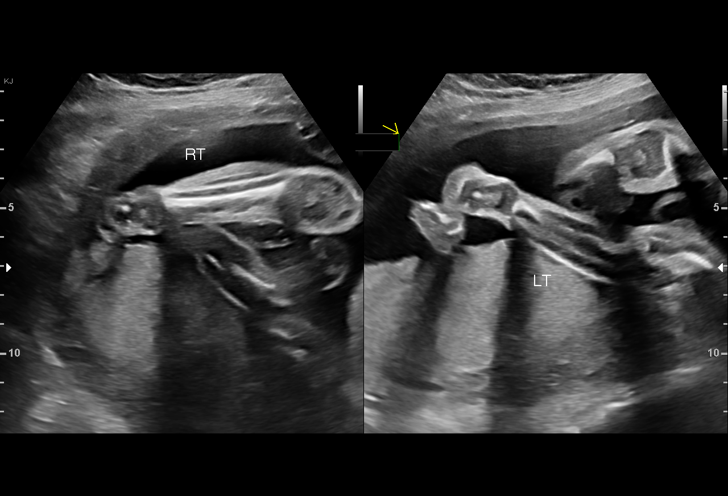
[im 33/55]
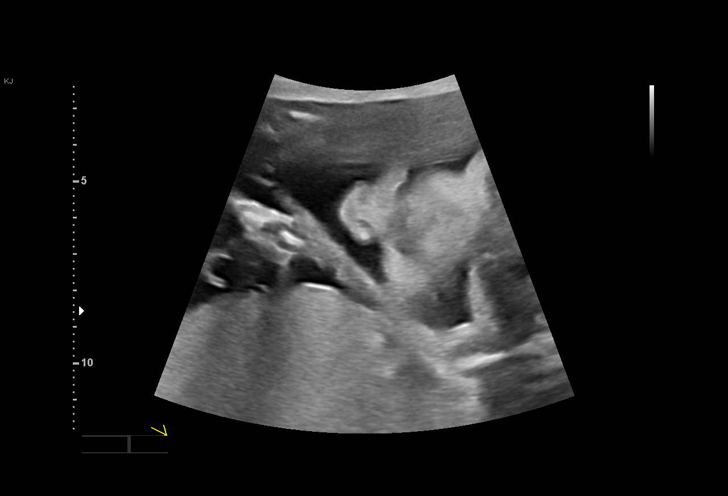
[im 37/55]
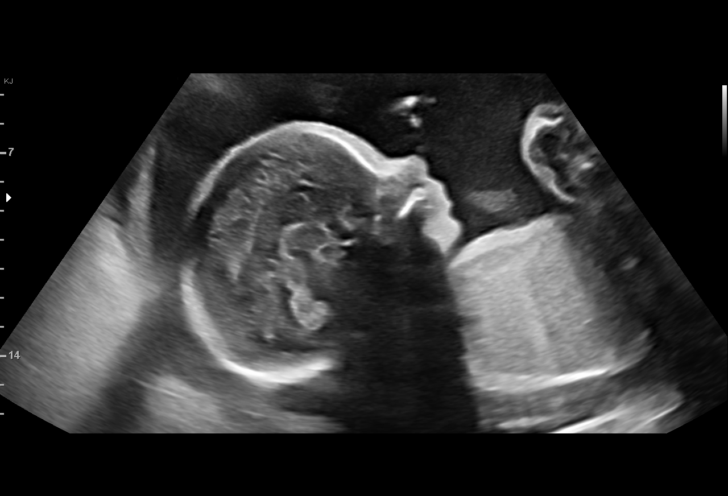
[im 41/55]
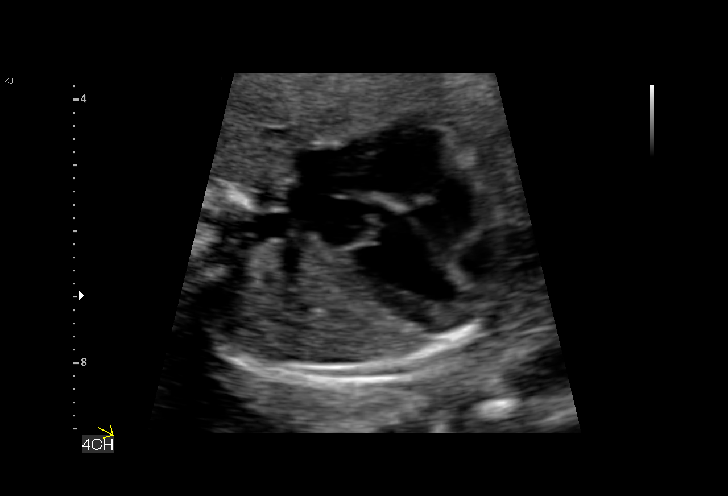
[im 45/55]
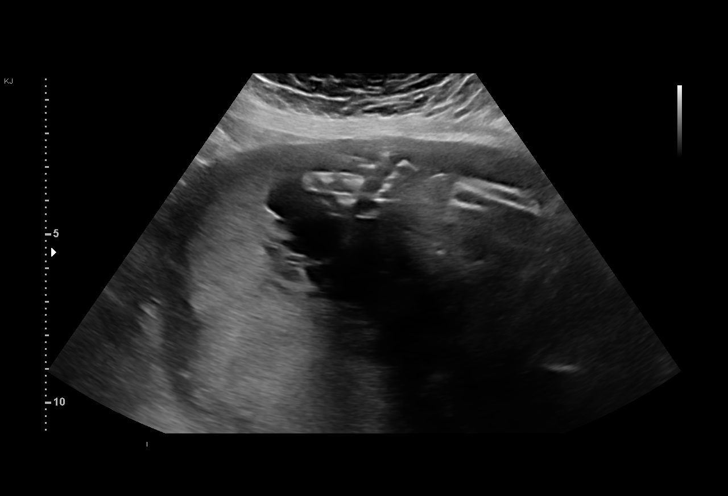
[im 49/55]
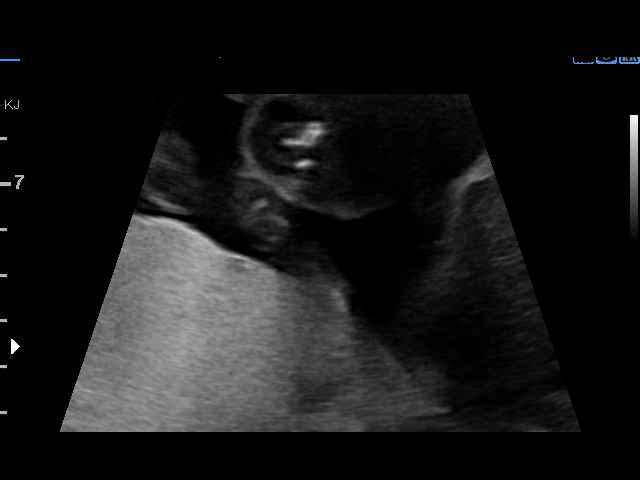
[im 53/55]
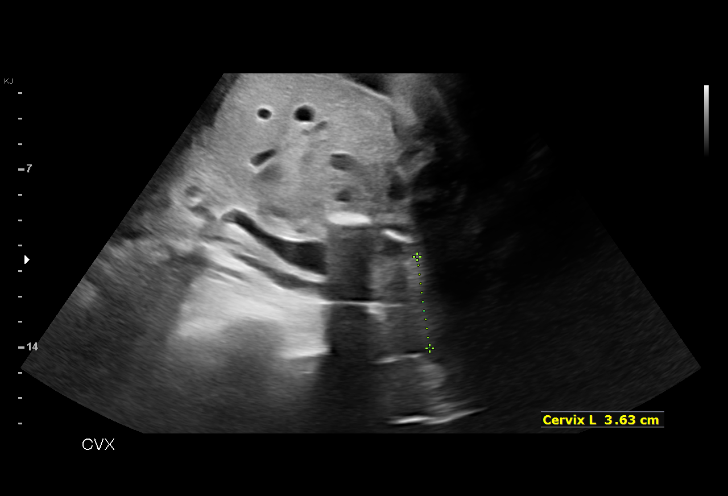

[13 of 28 positions shown; findings below may reference images not displayed]

Indications

 Pregnancy with inconclusive fetal viability    [NN]
 26 weeks gestation of pregnancy
 Encounter for other antenatal screening        [NN]
 follow-up
Fetal Evaluation

 Num Of Fetuses:         1
 Fetal Heart Rate(bpm):  141
 Cardiac Activity:       Observed
 Presentation:           Breech
 Placenta:               Posterior
 P. Cord Insertion:      Visualized, central

 Amniotic Fluid
 AFI FV:      Within normal limits
Biometry

 BPD:      69.3  mm     G. Age:  27w 6d         77  %    CI:        70.24   %    70 - 86
                                                         FL/HC:      18.4   %    18.6 -
 HC:      263.7  mm     G. Age:  28w 5d         85  %    HC/AC:      1.09        1.05 -
 AC:      241.2  mm     G. Age:  28w 3d         87  %    FL/BPD:     70.0   %    71 - 87
 FL:       48.5  mm     G. Age:  26w 2d         23  %    FL/AC:      20.1   %    20 - 24
 CER:      32.8  mm     G. Age:  28w 0d         90  %
 LV:          7  mm
 CM:        8.8  mm
 Est. FW:    [NN]  gm      2 lb 7 oz     77  %
OB History

 Gravidity:    6         Term:   2         SAB:   3
 Living:       2
Gestational Age

 LMP:           26w 5d        Date:  [DATE]                 EDD:   [DATE]
 U/S Today:     27w 6d                                        EDD:   [DATE]
 Best:          26w 5d     Det. By:  LMP  ([DATE])          EDD:   [DATE]
Anatomy

 Cranium:               Previously seen        LVOT:                   Previously seen
 Cavum:                 Previously seen        Aortic Arch:            Previously seen
 Ventricles:            Appears normal         Ductal Arch:            Not well visualized
 Choroid Plexus:        Previously seen        Diaphragm:              Appears normal
 Cerebellum:            Appears normal         Stomach:                Appears normal, left
                                                                       sided
 Posterior Fossa:       Appears normal         Abdomen:                Appears normal
 Nuchal Fold:           Not applicable (>20    Abdominal Wall:         Previously seen
                        wks GA)
 Face:                  Profile appears        Cord Vessels:           Previously seen
                        normal
 Lips:                  Appears normal         Kidneys:                Previously seen
 Palate:                Previously seen        Bladder:                Appears normal
 Thoracic:              Appears normal         Spine:                  Previously seen
 Heart:                 Appears normal         Upper Extremities:      Appears normal
                        (4CH, axis, and
                        situs)
 RVOT:                  Previously seen        Lower Extremities:      Appears normal

 Other:  3VV/T appears normal. Heels and 5th digit visualized. Nasal bone
         visualized. Open hands visualized. Fetus appears to be female.
Cervix Uterus Adnexa

 Cervix
 Normal appearance by transabdominal scan.
Impression

 Patient return for completion of fetal anatomy. Bile acid
 levels, performed because of abdominal itching, were within
 normal range. She does not have gestational diabetes.

 Fetal growth is appropriate for gestational age. Amniotic fluid
 is normal and good fetal activity seen. Fetal anatomical
 survey was completed (except ductal arch) and appears
 normal.

 Patient gives history of fetal or neonatal demise at 5 months,
 6 months and 8 months gestation (between her first and
 second pregnancies). The cause is not known.

 I explained ultrasound findings with help of interpreter present
 in the room
Recommendations

 -An appointment was made for her to return in 6 weeks for
 fetal growth assessment.
                 VORER

## 2020-07-19 MED ORDER — DIPHENHYDRAMINE HCL 25 MG PO TABS: 25.0000 mg | ORAL_TABLET | Freq: Every evening | ORAL | 0 refills | Status: DC | PRN

## 2020-07-19 NOTE — Progress Notes (Signed)
   PRENATAL VISIT NOTE  Subjective:  Diana Hopkins is a 25 y.o. [phone] at [redacted]w[redacted]d being seen today for ongoing prenatal care.  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  Patient reports fatigue, headache, no contractions and difficulty sleeping, pelvic pain, leg pain, arm pain.  Contractions: Not present. Vag. Bleeding: None.  Movement: Present. Denies leaking of fluid.   The following portions of the patient's history were reviewed and updated as appropriate: allergies, current medications, past family history, past medical history, past social history, past surgical history and problem list.   Objective:   Vitals:   07/19/20 0842  BP: (!) 97/54  Pulse: 77  Weight: 164 lb 14.4 oz (74.8 kg)    Fetal Status: Fetal Heart Rate (bpm): 141   Movement: Present     General:  Alert, oriented and cooperative. Patient is in no acute distress.  Skin: Skin is warm and dry. No rash noted.   Cardiovascular: Normal heart rate noted  Respiratory: Normal respiratory effort, no problems with respiration noted  Abdomen: Soft, gravid, appropriate for gestational age.  Pain/Pressure: Present     Pelvic: Cervical exam deferred        Extremities: Normal range of motion.  Edema: Trace  Mental Status: Normal mood and affect. Normal behavior. Normal judgment and thought content.   Assessment and Plan:  Pregnancy: [id number] at [redacted]w[redacted]d 1. Supervision of high risk pregnancy, antepartum - Peds list given  - GTT at next visit, fasting advised  - Normal [place] today, follow-up in 6 weeks due to history of second trimester miscarriage  2. H/O hand surgery - Ambulatory referral to Physical Therapy - Patient had  multiple surgeries as a child, states difficulty with strength, mobility, ROM and also decreased sensation   3. Late prenatal care affecting pregnancy in second trimester - Moved from [place]   4. Depressed mood - Will see Asher Muir this afternoon   5. Itching - diphenhydrAMINE (BENADRYL ALLERGY ULTRATABS) 25 MG tablet; Take 1 tablet (25 mg total) by mouth at bedtime as needed.  Dispense: 30 tablet; Refill: 0  6. Difficulty sleeping - Advised to take Benadryl at night   7. [redacted] weeks gestation of pregnancy  Preterm labor symptoms and general obstetric precautions including but not limited to vaginal bleeding, contractions, leaking of fluid and fetal movement were reviewed in detail with the patient. Please refer to After Visit Summary for other counseling recommendations.   Return in about 2 weeks (around 08/02/2020) for LOB, In-Person, 28 week labs (fasting).  Future Appointments  Date Time Provider Department Center  09/02/2020  8:30 AM [facility] NURSE [facility]  09/02/2020  8:45 AM [facility] US4 [facility]    Vonzella Nipple, PA-C

## 2020-07-19 NOTE — Patient Instructions (Addendum)
Second Trimester of Pregnancy  The second trimester of pregnancy is from week 13 through week 27. This is also called months 4 through 6 of pregnancy. This is often the time when you feel your best. During the second trimester:  Morning sickness is less or has stopped.  You may have more energy.  You may feel hungry more often. At this time, your unborn baby (fetus) is growing very fast. At the end of the sixth month, the unborn baby may be up to 12 inches long and weigh about 1 pounds. You will likely start to feel the baby move between 16 and 20 weeks of pregnancy. Body changes during your second trimester Your body continues to go through many changes during this time. The changes vary and generally return to normal after the baby is born. Physical changes  You will gain more weight.  You may start to get stretch marks on your hips, belly (abdomen), and breasts.  Your breasts will grow and may hurt.  Dark spots or blotches may develop on your face.  A dark line from your belly button to the pubic area (linea nigra) may appear.  You may have changes in your hair. Health changes  You may have headaches.  You may have heartburn.  You may have trouble pooping (constipation).  You may have hemorrhoids or swollen, bulging veins (varicose veins).  Your gums may bleed.  You may pee (urinate) more often.  You may have back pain. Follow these instructions at home: Medicines  Take over-the-counter and prescription medicines only as told by your doctor. Some medicines are not safe during pregnancy.  Take a prenatal vitamin that contains at least 600 micrograms (mcg) of folic acid. Eating and drinking  Eat healthy meals that include: ? Fresh fruits and vegetables. ? Whole grains. ? Good sources of protein, such as meat, eggs, or tofu. ? Low-fat dairy products.  Avoid raw meat and unpasteurized juice, milk, and cheese.  You may need to take these actions to prevent or  treat trouble pooping: ? Drink enough fluids to keep your pee (urine) pale yellow. ? Eat foods that are high in fiber. These include beans, whole grains, and fresh fruits and vegetables. ? Limit foods that are high in fat and sugar. These include fried or sweet foods. Activity  Exercise only as told by your doctor. Most people can do their usual exercise during pregnancy. Try to exercise for 30 minutes at least 5 days a week.  Stop exercising if you have pain or cramps in your belly or lower back.  Do not exercise if it is too hot or too humid, or if you are in a place of great height (high altitude).  Avoid heavy lifting.  If you choose to, you may have sex unless your doctor tells you not to. Relieving pain and discomfort  Wear a good support bra if your breasts are sore.  Take warm water baths (sitz baths) to soothe pain or discomfort caused by hemorrhoids. Use hemorrhoid cream if your doctor approves.  Rest with your legs raised (elevated) if you have leg cramps or low back pain.  If you develop bulging veins in your legs: ? Wear support hose as told by your doctor. ? Raise your feet for 15 minutes, 3-4 times a day. ? Limit salt in your food. Safety  Wear your seat belt at all times when you are in a car.  Talk with your doctor if someone is hurting you or yelling  at you a lot. Lifestyle  Do not use hot tubs, steam rooms, or saunas.  Do not douche. Do not use tampons or scented sanitary pads.  Avoid cat litter boxes and soil used by cats. These carry germs that can harm your baby and can cause a loss of your baby by miscarriage or stillbirth.  Do not use herbal medicines, illegal drugs, or medicines that are not approved by your doctor. Do not drink alcohol.  Do not smoke or use any products that contain nicotine or tobacco. If you need help quitting, ask your doctor. General instructions  Keep all follow-up visits. This is important.  Ask your doctor about local  prenatal classes.  Ask your doctor about the right foods to eat or for help finding a counselor. Where to find more information  American Pregnancy Association: americanpregnancy.org  [employer] of [employer]st[employer]ians and Gynecologists: www.acog.org  Office on [employer] Health: MightyReward.co.nz Contact a doctor if:  You have a headache that does not go away when you take medicine.  You have changes in how you see, or you see spots in front of your eyes.  You have mild cramps, pressure, or pain in your lower belly.  You continue to feel like you may vomit (nauseous), you vomit, or you have watery poop (diarrhea).  You have bad-smelling fluid coming from your vagina.  You have pain when you pee or your pee smells bad.  You have very bad swelling of your face, hands, ankles, feet, or legs.  You have a fever. Get help right away if:  You are leaking fluid from your vagina.  You have spotting or bleeding from your vagina.  You have very bad belly cramping or pain.  You have trouble breathing.  You have chest pain.  You faint.  You have not felt your baby move for the time period told by your doctor.  You have new or increased pain, swelling, or redness in an arm or leg. Summary  The second trimester of pregnancy is from week 13 through week 27 (months 4 through 6).  Eat healthy meals.  Exercise as told by your doctor. Most people can do their usual exercise during pregnancy.  Do not use herbal medicines, illegal drugs, or medicines that are not approved by your doctor. Do not drink alcohol.  Call your doctor if you get sick or if you notice anything unusual about your pregnancy. This information is not intended to replace advice given to you by your health care provider. Make sure you discuss any questions you have with your health care provider. Document Revised: 11/04/2019 Document Reviewed: 09/10/2019 Elsevier Patient Education  2021 Elsevier  Inc.  AREA PEDIATRIC/FAMILY PRACTICE PHYSICIANS  [place]outheast [place] ([address]) . [facility] Melodie Bouillon, MD; Lum Babe, MD; Sheffield Slider, MD; Leveda Anna, MD; McDiarmid, MD; Jerene Bears, MD; Jennette Kettle, MD; Gwendolyn Grant, MD o [address] [place] [place], [place] [address] o [phone] o Mon-Fri 8:30-12:30, 1:30-5:00 o Providers come to see babies at [facility] o Accepting Medicaid . [facility] [place] o Limited providers who accept newborns: Docia Chuck, MD; Kateri Plummer, MD; Paulino Rily, MD o [address], [place], [place] [address] o [phone] o Mon-Fri 8:00-5:30 o Babies seen by providers at [facility] o Does NOT accept Medicaid o Please call early in hospitalization for appointment (limited availability)  . [facility] Fatima Sanger, MD o [address] [place], [place] [address] o [phone], Tue, Thur, Fri 8:30-5:00, Wed 10:00-7:00 (closed 1-2pm)  o Babies seen by [facility] providers o Accepting Medicaid . [place], MD o [place], [place], [place] [address] o [phone] o Mon-Fri 8:30-5:00, Sat 8:30-12:00 o Provider comes to see babies at [place] Medicaid o Must have been referred from current patients or contacted office prior to delivery . [place] ([place] [facility] [place]) Franne Forts, MD; Tamera Punt, MD; Doneen Poisson, MD; Fatima Sanger, MD; Wynetta Emery, MD; Jess Barters, MD; Tami Ribas, MD; Herbert Moors, MD; Derrell Lolling, MD; Dorothyann Peng, MD; Lucious Groves, NP; Baldo Ash, NP o [address], [place], [place] [address] o [phone] o Mon, Tue, Thur, Fri 8:30-5:30, Wed 9:30-5:30, Sat 8:30-12:30 o Babies seen by [facility] providers o Accepting Medicaid o Only accepting infants of first-time parents or siblings of current patients [facility] discharge coordinator will make follow-up appointment . Baltazar Najjar o [place] [address] [place], [place]  [address] o [phone]   Fax - [phone] . [facility] o [address], [place], [place]  [address] o Phone - [phone]   Fax [phone] . [place], [place], [place], [place]  [address] o [phone]  [place] [phone]) . [facility] Reginal Lutes, MD; Jacklynn Ganong, MD; Torrie Mayers, MD; MD; Rosana Hoes, MD; Servando [place], MD; Rose Fillers, MD; Rex Kras, MD; Corinna Capra, MD; Volney American, MD; Trilby Drummer, MD; Janann Colonel, MD; Jimmye Norman, [place], [place], [place] [address] o [phone] o Mon-Fri 8:30-5:00 (extended evenings Mon-Thur as needed), Sat-Sun 10:00-1:00 o Providers come to see babies at [place] Medicaid for families of first-time babies and families with all children in the household age 18 and under. Must register with office prior to making appointment (M-F only). . [place], NP; Tomi Bamberger, MD; [place] School, MD; [place], [place] [address] [place], [place] [address] o [phone] o Mon-Fri 8:00-5:00 o Babies seen by providers at [facility] o Does NOT accept Medicaid/Commercial Insurance Only . [facility] [place] ([facility])  Marnee Guarneri, MD; Drema [place], MD; Montine Circle, MD; Vilma Prader, MD; Vanita Panda, MD; Alfonso Ramus, MD; Ruthann Cancer, MD; Roxanne Mins, MD; Rosalva Ferron, MD; Polly Cobia, MD o [address] [place], [place] [address] o [phone] o Mon-Fri 8:30-5:30, Sat (Oct.-Mar.) 9:00-1:00 o Babies seen by providers at [place] [phone]) . [facility] Peers, MD; Suzan Slick, MD o [place] [address], [place],  [address] o [phone] o Mon-Fri 8:30-5:00, Sat 8:30-12:00 o Providers come to see babies at [facility] o Does NOT accept Medicaid . [place] at [place], [place]; [place], MD; [place], [place]; Nancy Fetter, MD; Moreen Fowler, [place],  [place],  [address] o [phone] o Mon-Fri 8:00-5:00 o Babies seen by providers at [facility] o Does NOT accept Medicaid o Only accepting babies of parents who are patients o Please call early in hospitalization for appointment (limited availability) . [facility] Blanca Friend, MD; Sharlene Motts, MD; Rod Can, MD; Warner Mccreedy, NP; Sabra Heck, MD; Ermalinda Memos, MD; Sharlett Iles, NP; Aurther Loft, MD; Jerrye Beavers, MD; Marcello Moores, MD; Berline Lopes, MD; Charolette Forward, MD o [place]. [place], [place],  [address] o [phone] o Mon-Fri 8:00-5:00, Sat 9:00-12:00 o Providers come to see babies at [facility] o Does NOT accept [facility] [phone]) . [place] [facility] [place] providers accepting new patients: Dayna Ramus, NP; Rolland Porter, [place]  [address] [place], [place] [address] o [phone] o Mon-Fri 8:00-5:00 o Babies seen by providers at [facility] o Does NOT accept Medicaid o Only accepting babies of parents who are patients o Please call early in hospitalization for appointment (limited availability) . [facility] Luan Pulling, MD; Nash Dimmer, MD o [address] [place] [place], [place] [address] o [phone] (press 1 to schedule appointment) o Mon-Fri 8:00-5:00 o Providers come to see babies at [facility] o Does NOT accept Medicaid . [facility] Cristino Martes, MD o [address], [place], [place] [address] o [phone] o Mon-Fri 8:30-5:00 (lunch 12:30-1:00), extended hours by appointment only Wed 5:00-6:30 o Babies seen by [facility] providers o Accepting Medicaid . [facility] Abbot, MD; [place], MD; Hassan Rowan, MD o [address] [place], [place], [place] [address] o [phone] o Mon-Fri 8:00-5:00 o Babies seen by [facility] providers o Does NOT accept Medicaid . Nature conservation officer at [place] [address] [place] S[place], MD; Durene Cal, MD; [place], DO o [address] [place], [place]  [address] o [phone] o Mon-Fri 8:00-5:00 o Babies seen by [facility] providers o Does NOT accept Medicaid . [facility] o [place], [place]; [place], [place]; [place], NP; Avis Epley, MD; Vonna Kotyk, MD; Clance Boll, MD; Stevphen Rochester, NP; Arvilla Market, NP; Ann Maki, NP; Otis Dials, NP; Vaughan Basta, MD; [place], MD o [address] [place], [place] [address] o [phone] o Mon-Fri 8:30-5:00, Sat 10:00-1:00 o Providers come to see babies at [facility] o Does NOT accept Medicaid o Free prenatal information session Tuesdays at 4:45pm . [facility] [name], MD; [place], [place]; [place], [place]; [place], [place] o [address] [place] [address] o [phone] o Mon-Fri 7:30-5:30 o Babies seen by [facility] providers . [facility] o [address], [place], [place]  [address] o [phone]   Fax - [phone]  [place] [phone] & [phone]) . [facility] Alphonsa Overall, MD o [address] [place], [place] [address] o [phone] o Mon-Thur 8:00-6:00 o Providers come to see babies at [facility] o Accepting Medicaid . [facility] Zenon Mayo, NP; Cyndia Bent, MD; [place], [place]; [place], [place] o [address] [place], [place] [address] o [phone] o Mon-Thur 7:30-7:30, Fri 7:30-4:30 o Babies seen by [facility] providers o Accepting Medicaid . [facility] Cheryle Horsfall, MD; Janene Harvey, NP; Vonita Moss, MD o [address], [place], [place] [address] o [phone] o Mon-Fri 8:30-5:00, Sat 8:30-12:00 o Providers come to see babies at [facility] o Accepting Medicaid o Must have "Meet & Greet" appointment at office prior to delivery . [facility] ([facility]) Llana Aliment, MD; Earlene Plater, MD; Lucretia Roers, MD o [address], [place], [place] [address] o [phone] o Mon-Wed 8:00-6:00, Thur-Fri 8:00-5:00, Sat 9:00-12:00 o Providers come to  see babies at [facility] o Does NOT accept Medicaid o Only accepting siblings of current patients . [facility] [place]  o [address], [place], [place]  [address] o [phone]   Fax - [phone] . [facility] o [phone] [address], [place], [place]  [address] o [phone]   Fax - [phone]  [place] [phone] & [phone]) . Nature conservation officer at [facility] o [place], DO; Knox, DO o [address] [place], [place] [address] o [phone] o Mon-Fri 7:00-5:00 o Babies seen by [facility] providers o Does NOT accept Medicaid . [facility] o  Doreene Nest, MD; [place], [place]; [place], [place], [place], [place] [address] o [phone] o Mon-Fri 8:00-5:00 o Babies seen by [facility] providers o Accepting Medicaid . [place], MD; [place], [place]; [place], NP; [place], [place] [facility] [place], [place], [place] [address] o [phone] o Mon-Fri 8:00-5:00 o Babies seen by providers at [facility] [place] [phone]) . [facility] [place], [place] o [address] [place], [place] [address] o [phone] o Mon-Fri 8:00-5:00 o Babies seen by [facility] providers o Does NOT accept Medicaid o Limited availability, please call early in hospitalization to schedule follow-up . [facility] Leilani Merl, PA; Maisie Fus, MD; [place], MD; [place], [place]; Jeannine Kitten, MD; [place], [place] [facility] [address], [place], [place] [address] o [phone] o Mon-Fri 8:30-5:00, Sat 9:00-12:00 o Babies seen by providers at [facility] o Accepting Medicaid o Please register online then schedule online or call office o [email or web address] . [place] ([place] at  [place]) Kristian Covey, NP; Dwyane Dee, MD; Leonidas Romberg, PA o [address] [place], [place], [place] [address] o [phone] o Mon-Fri 8:00-5:00 o Babies seen by providers at [facility] o Accepting Medicaid . [place] ([facility] [employer]) Dairl Ponder, MD; Rayvon Char, NP; Melina Modena, MD o [address] [place], [place], [place] [address] o [phone] o Mon-Fri 8:00-5:30, Sat&Sun by appointment (phones open at 8:30) o Babies seen by [facility] providers o Accepting Medicaid o Must be a first-time baby or sibling of current patient . [place], [id number], [place], [place]  [address] o [phone]   Fax - [phone]  [place] [phone] & [phone]) . [place], [place]; [place], [place]; Benjamine Mola, MD; [place], [place]; Harrell Lark, MD o [address], [place], [place] [address] o [phone] o Mon-Thur 8:00-7:00, Fri 8:00-5:00, Sat 8:00-12:00, Sun 9:00-12:00 o Babies seen by [facility] providers o Accepting Medicaid . [facility] at [facility], MD; Ruthann Cancer, MD; [facility], MD o [address], [place], [place] [address] o [phone] o Mon-Thur 8:00-5:00 o Babies seen by providers at [facility] o Accepting Medicaid . [facility] at [place], MD; [place], MD; Amedeo Plenty, MD; Bobby Rumpf, MD; List, MD; Lavonia Drafts, MD; Ruthann Cancer, MD; Selinda Eon, MD; Audie Box, MD; Jim Like, MD; Christie Nottingham, MD; Hubbard Hartshorn, MD; Modena Nunnery, MD o [address] [place], [place] [address] o [phone] o Mon-Fri 8:00-5:30, Sat (Oct.-Mar.) 9:00-1:00 o Babies seen by providers at [facility] o Accepting Medicaid o Must fill out new patient packet, available online at [email or web address] . [place] ([facility]) Barnabas Lister, NP; [place]on Kingfisher, NP; Claiborne Billings, NP; Rolla Plate, MD;  [place], [place]; Carola Rhine, MD; Tyron Russell, MD; Delia Chimes, NP o [address], [place], [place] [address] o [phone] o Mon-Thur 8:00-5:30, Fri 8:00-5:00 o Babies seen by providers at [place] [phone]) . [place], [place]; [place], MD; Dennard Schaumann, MD; [place], [place] o [address] [place],  [address] o [phone] o Mon-Fri 8:00-5:00 o Babies seen by providers at [place] [phone]) . [place]  at [facility] o [place], DO; Lenise Arena, MD; [place], [place] o [address], [place], [place] [address] o [phone] o Mon-Fri 8:00-5:00 o Babies seen by providers at [facility] o Does NOT accept Medicaid o Limited appointment availability, please call early in hospitalization  . Nature conservation officer at [facility] o [place], DO; [place], MD o [address] [place], [place] [address] o [phone] o Mon-Fri 8:00-5:00 o Babies seen by [facility] providers o Does NOT accept Medicaid . [facility] Lorrine Kin, MD; Ninetta Lights, MD; [place], [place]; [place], MD o [address], [place], [place] [address] o [phone] o Mon-Fri 8:00-5:00 o After hours clinic [facility] [place], [place] [address]) [phone] Mon-Fri 5:00-8:00, Sat 12:00-6:00, Sun 10:00-4:00 o Babies seen by [facility] providers o Accepting Medicaid . [facility] at [facility] o [address] [place], [place]  [address] o [phone]   Fax - [phone]  [place] [phone]) . Nature conservation officer at [facility] [place], MD o [address] [place], [place], [place] [address] o [phone] o Mon-Fri 8:00-5:00 o Babies seen by [facility] providers o Does NOT accept Medicaid . [facility] - [place] [facility] [place]) Tomi Likens, MD o [address] [place] [address], [place], [place]  [address] o [phone] o Mon-Thur 8:00-7:00, Fri 8:00-5:00, Sat 8:00-12:00 o Babies seen by providers at [facility] o Accepting Medicaid - but does not have vaccinations in office (must be received elsewhere) o Limited availability, please call early in hospitalization  [place] ([phone]) . [facility]  o Wyvonne Lenz, MD o [address] [place] [address] o [phone]  Fax [phone]  Benadryl at bedtime for itching and help with sleep  Pregnancy belt for back pain - wear throughout the day  If you have bleeding or contractions go to the [facility] - Maternity Assessment Unit

## 2020-07-25 NOTE — BH Specialist Note (Signed)
Integrated Behavioral Health via Telemedicine Visit  07/25/2020 Diana Hopkins [id number]  Number of Integrated Behavioral Health visits: 1 Session Start time: 2:46 Session End time: 3:19 Total time: 73  Referring Provider: [name], CNM Patient/Family location: Home [facility] Provider location: Center for [facility]  All persons participating in visit:Patient Diana Hopkins and [facility] [place] Sheriff Rodenberg Caballo and [name], [name]  Types of Service: Individual psychotherapy  I connected with Diana Hopkins and/or Diana Hopkins n/a by Telephone  (Video is Caregility application) and verified that I am speaking with the correct person using two identifiers.Discussed confidentiality: Yes   I discussed the limitations of telemedicine and the availability of in person appointments.  Discussed there is a possibility of technology failure and discussed alternative modes of communication if that failure occurs.  I discussed that engaging in this telemedicine visit, they consent to the provision of behavioral healthcare and the services will be billed under their insurance.  Patient and/or legal guardian expressed understanding and consented to Telemedicine visit: Yes   Presenting Concerns: Patient and/or family reports the following symptoms/concerns: Pt states her primary concern today is feeling anxious and worried about losing baby, after previous losses (at 52months, 31months; 69months gestation). Pt is not sleeping well due to not feeling well with pain in her back and belly. Pt expresses poor medical care and lack of empathy with losses prior to coming to the [place], and very thankful for medical care now. Pt worries about her legs swelling and itching at night, as this problem has persisted throughout previous pregnancies, including legs swelling one week after giving birth to previous babies. Pt has supportive family/extended family, has a good appetite, and is  taking prenatal vitamin daily.  Duration of problem: Current pregnancy; Severity of problem: moderate  Patient and/or Family's Strengths/Protective Factors: Social connections and Sense of purpose  Goals Addressed: Patient will: 1.  Reduce symptoms of: anxiety  2.  Increase knowledge and/or ability of: stress reduction  3.  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   Patient may benefit from psychoeducation and brief therapeutic interventions regarding coping with symptoms of anxiety .  Plan: 1. Follow up with behavioral health clinician on : Three weeks 2. Behavioral recommendations:  -Continue taking prenatal vitamin daily -Accept referral to [facility] -Medical provider will address medical concerns at next visit on 08/22/20 (someone from [facility] will call to notify if visit is able to be changed to virtual or not, due to transportation/childcare barriers) 3. Referral(s): Integrated [employer] (In Clinic)  I discussed the assessment and treatment plan with the patient and/or parent/guardian. They were provided an opportunity to ask questions and all were answered. They agreed with the plan and demonstrated an understanding of the instructions.   They were advised to call back or seek an in-person evaluation if the symptoms worsen or if the condition fails to improve as anticipated.  Rae Lips, LCSW   Depression screen [facility] 2/9 07/19/2020 06/22/2020  Decreased Interest 1 2  Down, Depressed, Hopeless 1 1  PHQ - 2 Score 2 3  Altered sleeping 1 0  Tired, decreased energy 1 2   Change in appetite 1 0  Feeling bad or  failure about yourself  1 0  Trouble concentrating 2 0  Moving slowly or fidgety/restless 0 0  Suicidal thoughts 0 0  PHQ-9 Score 8 5   GAD 7 : Generalized Anxiety Score 07/19/2020 06/22/2020  Nervous, Anxious, on Edge - 2  Control/stop worrying 2 2  Worry too much - different things 2 0  Trouble relaxing 2 2  Restless 2 0  Easily annoyed or irritable 2 0  Afraid - awful might happen 2 0  Total GAD 7 Score - 6

## 2020-07-28 ENCOUNTER — Inpatient Hospital Stay (HOSPITAL_COMMUNITY)
Admission: AD | Admit: 2020-07-28 | Discharge: 2020-07-29 | Disposition: A | Discharge status: Home / Self Care | Payer: Medicaid Other | Attending: Family Medicine | Admitting: Family Medicine

## 2020-07-28 ENCOUNTER — Other Ambulatory Visit: Payer: Self-pay

## 2020-07-28 ENCOUNTER — Encounter (HOSPITAL_COMMUNITY): Payer: Self-pay | Admitting: Family Medicine

## 2020-07-28 ENCOUNTER — Inpatient Hospital Stay (HOSPITAL_COMMUNITY): Payer: Medicaid Other

## 2020-07-28 DIAGNOSIS — J029 Acute pharyngitis, unspecified: Secondary | ICD-10-CM

## 2020-07-28 DIAGNOSIS — Z3A28 28 weeks gestation of pregnancy: Secondary | ICD-10-CM | POA: Diagnosis not present

## 2020-07-28 DIAGNOSIS — J069 Acute upper respiratory infection, unspecified: Secondary | ICD-10-CM

## 2020-07-28 DIAGNOSIS — R0902 Hypoxemia: Secondary | ICD-10-CM | POA: Diagnosis not present

## 2020-07-28 DIAGNOSIS — J028 Acute pharyngitis due to other specified organisms: Secondary | ICD-10-CM | POA: Diagnosis not present

## 2020-07-28 DIAGNOSIS — O99513 Diseases of the respiratory system complicating pregnancy, third trimester: Secondary | ICD-10-CM | POA: Diagnosis not present

## 2020-07-28 DIAGNOSIS — R42 Dizziness and giddiness: Secondary | ICD-10-CM | POA: Diagnosis present

## 2020-07-28 DIAGNOSIS — Z20822 Contact with and (suspected) exposure to covid-19: Secondary | ICD-10-CM | POA: Diagnosis not present

## 2020-07-28 DIAGNOSIS — R509 Fever, unspecified: Secondary | ICD-10-CM | POA: Diagnosis not present

## 2020-07-28 LAB — TROPONIN I (HIGH SENSITIVITY): Troponin I (High Sensitivity): 4 ng/L (ref <18)

## 2020-07-28 LAB — FIBRINOGEN: Fibrinogen: 525 mg/dL — ABNORMAL HIGH (ref 210–475)

## 2020-07-28 LAB — RESP PANEL BY RT-PCR (FLU A&B, COVID) ARPGX2
Influenza A by PCR: NEGATIVE
Influenza B by PCR: NEGATIVE
SARS Coronavirus 2 by RT PCR: NEGATIVE

## 2020-07-28 LAB — COMPREHENSIVE METABOLIC PANEL
ALT: 24 U/L (ref 0–44)
AST: 25 U/L (ref 15–41)
Albumin: 2.4 g/dL — ABNORMAL LOW (ref 3.5–5.0)
Alkaline Phosphatase: 122 U/L (ref 38–126)
Anion gap: 10 (ref 5–15)
BUN: 8 mg/dL (ref 6–20)
CO2: 18 mmol/L — ABNORMAL LOW (ref 22–32)
Calcium: 8.5 mg/dL — ABNORMAL LOW (ref 8.9–10.3)
Chloride: 104 mmol/L (ref 98–111)
Creatinine, Ser: 0.53 mg/dL (ref 0.44–1.00)
GFR, Estimated: >60 mL/min (ref >60)
Glucose, Bld: 103 mg/dL — ABNORMAL HIGH (ref 70–99)
Potassium: 3.7 mmol/L (ref 3.5–5.1)
Sodium: 132 mmol/L — ABNORMAL LOW (ref 135–145)
Total Bilirubin: 0.6 mg/dL (ref 0.3–1.2)
Total Protein: 6.1 g/dL — ABNORMAL LOW (ref 6.5–8.1)

## 2020-07-28 LAB — CBC WITH DIFFERENTIAL/PLATELET
Abs Immature Granulocytes: 0.08 10*3/uL — ABNORMAL HIGH (ref 0.00–0.07)
Basophils Absolute: 0 10*3/uL (ref 0.0–0.1)
Basophils Relative: 0 %
Eosinophils Absolute: 0 10*3/uL (ref 0.0–0.5)
Eosinophils Relative: 0 %
HCT: 33.7 % — ABNORMAL LOW (ref 36.0–46.0)
Hemoglobin: 11.4 g/dL — ABNORMAL LOW (ref 12.0–15.0)
Immature Granulocytes: 1 %
Lymphocytes Relative: 10 %
Lymphs Abs: 0.9 10*3/uL (ref 0.7–4.0)
MCH: 30.1 pg (ref 26.0–34.0)
MCHC: 33.8 g/dL (ref 30.0–36.0)
MCV: 88.9 fL (ref 80.0–100.0)
Monocytes Absolute: 0.5 10*3/uL (ref 0.1–1.0)
Monocytes Relative: 6 %
Neutro Abs: 7.4 10*3/uL (ref 1.7–7.7)
Neutrophils Relative %: 83 %
Platelets: 194 10*3/uL (ref 150–400)
RBC: 3.79 MIL/uL — ABNORMAL LOW (ref 3.87–5.11)
RDW: 12.8 % (ref 11.5–15.5)
WBC: 8.8 10*3/uL (ref 4.0–10.5)
nRBC: 0 % (ref 0.0–0.2)

## 2020-07-28 LAB — C-REACTIVE PROTEIN: CRP: 2.5 mg/dL — ABNORMAL HIGH (ref <1.0)

## 2020-07-28 LAB — FERRITIN: Ferritin: 10 ng/mL — ABNORMAL LOW (ref 11–307)

## 2020-07-28 LAB — D-DIMER, QUANTITATIVE: D-Dimer, Quant: 1.4 ug/mL-FEU — ABNORMAL HIGH (ref 0.00–0.50)

## 2020-07-28 LAB — BRAIN NATRIURETIC PEPTIDE: B Natriuretic Peptide: 27.5 pg/mL (ref 0.0–100.0)

## 2020-07-28 IMAGING — DX DG CHEST 1V PORT
1 series · 1 of 1 positions shown · non-contrast
Comparison: None

CLINICAL DATA: Hypoxia, fever, congestion

EXAM:
PORTABLE CHEST 1 VIEW

[chest]
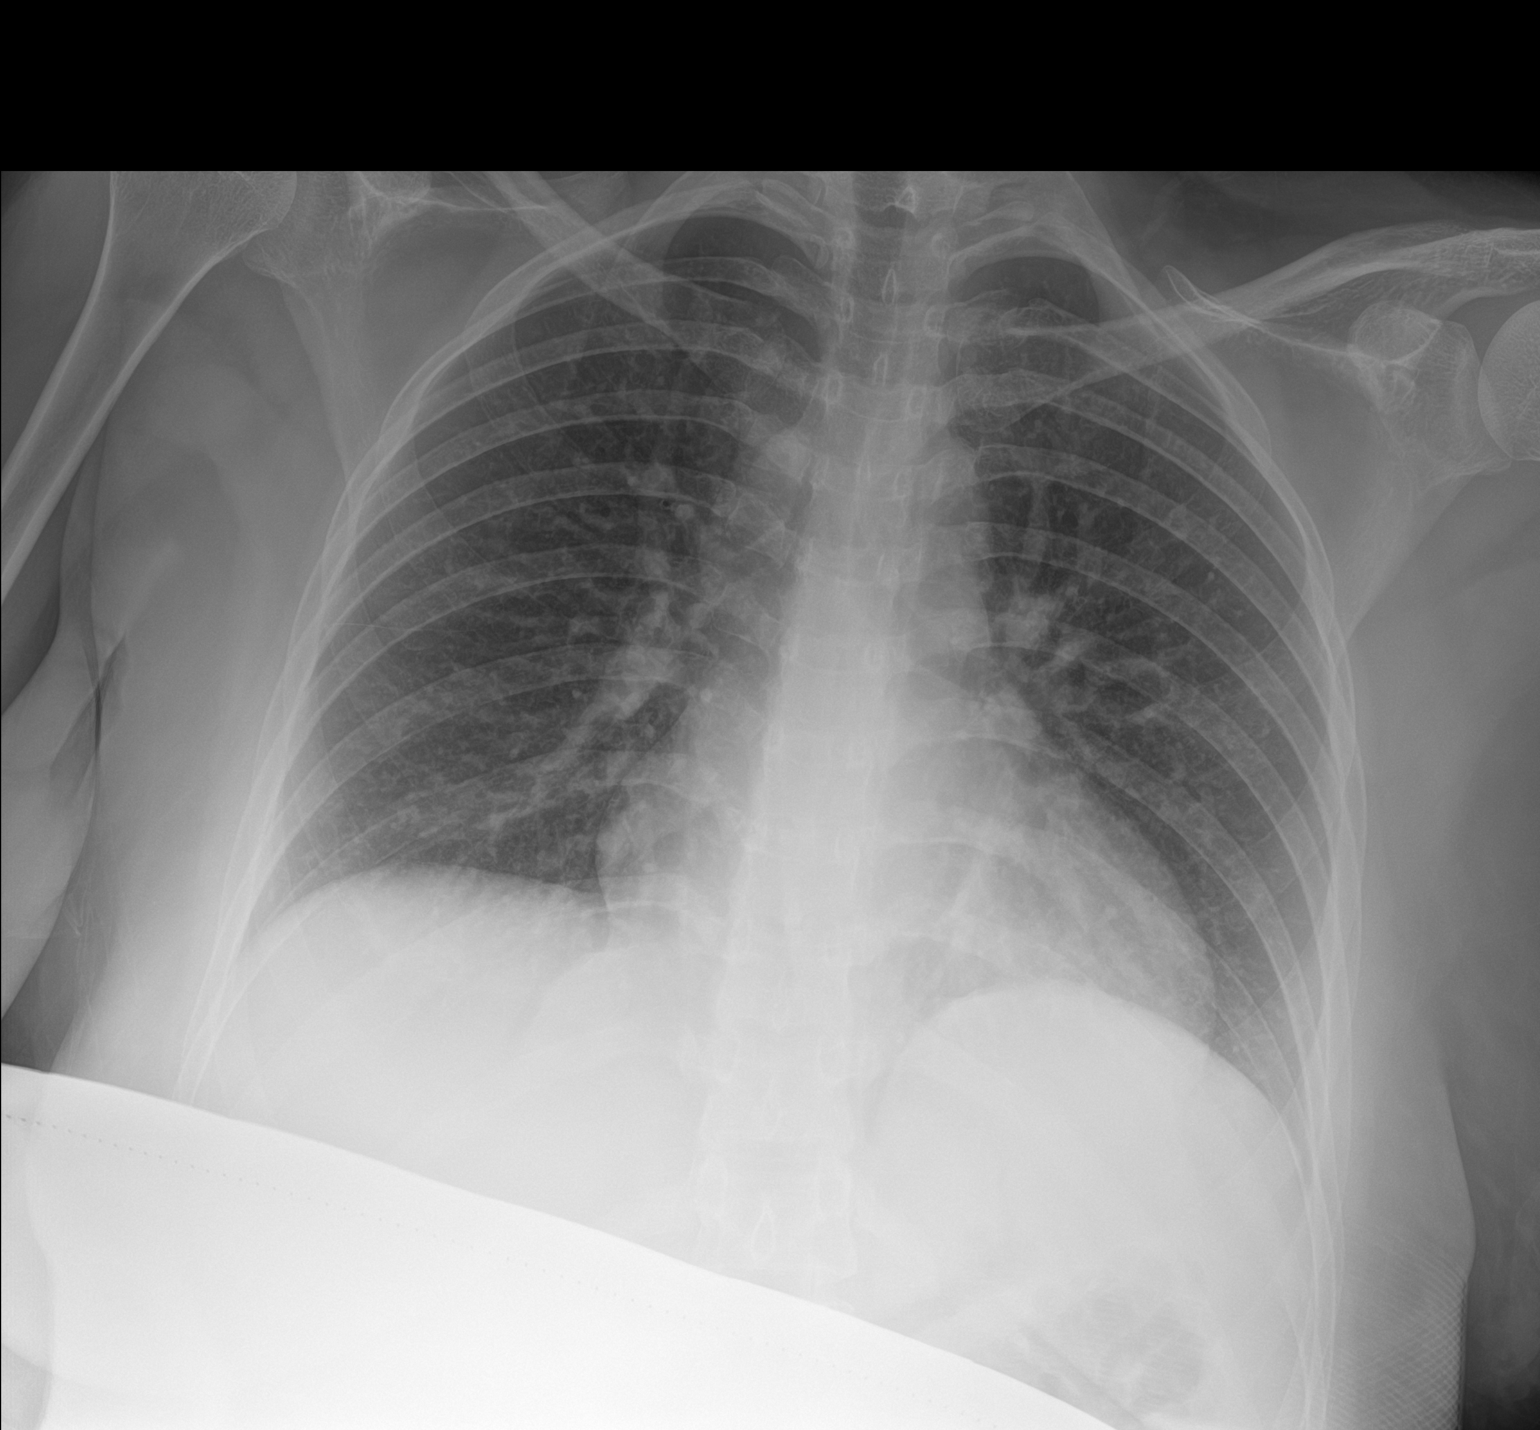

[1 of 1 positions shown; findings below may reference images not displayed]

FINDINGS: Single frontal view of the chest demonstrates an unremarkable
cardiac silhouette. No airspace disease, effusion, or pneumothorax.
No acute bony abnormalities.
IMPRESSION: 1. No acute intrathoracic process.

## 2020-07-28 MED ORDER — SODIUM CHLORIDE 0.9 % IV SOLN
200.0000 mg | Freq: Once | INTRAVENOUS | Status: AC
  Administered 2020-07-28: 200 mg via INTRAVENOUS
  Filled 2020-07-28: qty 40

## 2020-07-28 MED ORDER — METHYLPREDNISOLONE SODIUM SUCC 125 MG IJ SOLR
1.0000 mg/kg | Freq: Two times a day (BID) | INTRAMUSCULAR | Status: DC
  Administered 2020-07-28: 75 mg via INTRAVENOUS
  Filled 2020-07-28: qty 2

## 2020-07-28 MED ORDER — SODIUM CHLORIDE 0.9 % IV SOLN
100.0000 mg | Freq: Every day | INTRAVENOUS | Status: DC
  Filled 2020-07-28: qty 20

## 2020-07-28 MED ORDER — MENTHOL 3 MG MT LOZG
1.0000 | LOZENGE | OROMUCOSAL | Status: DC | PRN
  Filled 2020-07-28: qty 9

## 2020-07-28 MED ORDER — ACETAMINOPHEN 500 MG PO TABS
1000.0000 mg | ORAL_TABLET | Freq: Once | ORAL | Status: AC
  Administered 2020-07-28: 1000 mg via ORAL
  Filled 2020-07-28: qty 2

## 2020-07-28 MED ORDER — PREDNISONE 50 MG PO TABS: 50.0000 mg | ORAL_TABLET | Freq: Every day | ORAL | Status: DC

## 2020-07-28 NOTE — MAU Note (Addendum)
..  Diana Hopkins is a 25 y.o. at [redacted]w[redacted]d here in MAU reporting: dizziness, body aches, and sore throat for 3 days. Denies CTX or vaginal bleeding. +FM. Reports that her husband and child were sick a few days ago. Vitals:   07/28/20 2127  BP: 114/68  Pulse: (!) 114  Resp: 16  Temp: (!) 100.4 F (38 C)  SpO2: 97%     Lab orders placed from triage: UA

## 2020-07-28 NOTE — MAU Provider Note (Signed)
Chief Complaint:  Dizziness and Generalized Body Aches   Event Date/Time   First Provider Initiated Contact with Patient 07/28/20 2156     HPI: Diana Hopkins is a 25 y.o. [id number] at 24w0dwho presents to maternity admissions reporting fever, aches, sore throat, and dizziness.  Husband and child were sick this week. She reports good fetal movement, denies LOF, vaginal bleeding, vaginal itching/burning, urinary symptoms, h/a, n/v, diarrhea, constipation  Dizziness This is a new problem. The current episode started today. The problem occurs intermittently. The problem has been unchanged. Associated symptoms include chills, fatigue, a fever, myalgias and a sore throat. Pertinent negatives include no abdominal pain, congestion, coughing or vomiting. Nothing aggravates the symptoms. She has tried nothing for the symptoms.    RN Note: Diana Hopkins is a 25 y.o. at [redacted]w[redacted]d here in MAU reporting: dizziness, body aches, and sore throat for 3 days. Denies CTX or vaginal bleeding. +FM. Reports that her husband and child were sick a few days ago.  Past Medical History: Past Medical History:  Diagnosis Date  . Medical history non-contributory     Past obstetric history: OB History  Gravida Para Term Preterm AB Living  6 2 2   3 2   SAB IAB Ectopic Multiple Live Births  3       2    # Outcome Date GA Lbr Len/2nd Weight Sex Delivery Anes PTL Lv  6 Current           5 Term 01/21/17          4 Term 08/25/15          3 SAB           2 SAB           1 SAB             Past Surgical History: Past Surgical History:  Procedure Laterality Date  . arm surgery      Family History: History reviewed. No pertinent family history.  Social History: Social History   Tobacco Use  . Smoking status: Never Smoker  . Smokeless tobacco: Never Used  Vaping Use  . Vaping Use: Never used  Substance Use Topics  . Alcohol use: Never  . Drug use: Never    Allergies: No Known Allergies  Meds:  Medications  Prior to Admission  Medication Sig Dispense Refill Last Dose  . acetaminophen (TYLENOL 8 HOUR) 650 MG CR tablet Take 1 tablet (650 mg total) by mouth every 6 (six) hours as needed for pain. 30 tablet 0 07/28/2020 at Unknown time  . Prenatal Vit-Fe Fumarate-FA (PRENATAL MULTIVITAMIN) TABS tablet Take 1 tablet by mouth daily at 12 noon.   07/28/2020 at Unknown time  . diphenhydrAMINE (BENADRYL ALLERGY ULTRATABS) 25 MG tablet Take 1 tablet (25 mg total) by mouth at bedtime as needed. 30 tablet 0   . miconazole (MICONAZOLE 7) 2 % vaginal cream Place 1 Applicatorful vaginally at bedtime. 45 g 0   . Prenatal Vit-Fe Fumarate-FA (PRENATAL VITAMINS) 28-0.8 MG TABS Take 1 tablet by mouth daily. (Patient not taking: Reported on 07/19/2020) 90 tablet 2     I have reviewed patient's Past Medical Hx, Surgical Hx, Family Hx, Social Hx, medications and allergies.   ROS:  Review of Systems  Constitutional: Positive for chills, fatigue and fever.  HENT: Positive for sore throat. Negative for congestion.   Respiratory: Negative for cough.   Gastrointestinal: Negative for abdominal pain and vomiting.  Musculoskeletal: Positive for myalgias.  Neurological:  Positive for dizziness.   Other systems negative  Physical Exam   Patient Vitals for the past 24 hrs:  BP Temp Pulse Resp SpO2  07/28/20 2127 114/68 (!) 100.4 F (38 C) (!) 114 16 97 %   Oxygen saturation dropped to 92% when out of bed  Vitals:   07/28/20 2330 07/29/20 0010 07/29/20 0015 07/29/20 0020  BP:      Pulse:      Resp:      Temp:    99.5 F (37.5 C)  TempSrc:    Oral  SpO2: 98% 98% 97% 97%    Constitutional: Well-developed, Ill-appearing female in no acute distress.  Cardiovascular: tachycardic rate and regular rhythm Respiratory: normal effort, clear to auscultation bilaterally GI: Abd soft, non-tender, gravid appropriate for gestational age.   No rebound or guarding. MS: Extremities nontender, no edema, normal ROM Neurologic:  Alert and oriented x 4.  GU: Neg CVAT.  PELVIC EXAM: deferred   FHT:  Baseline 180 , moderate variability, accelerations present, no decelerations Contractions:  Rare   Labs: Results for orders placed or performed during the hospital encounter of 07/28/20 (from the past 24 hour(s))  Resp Panel by RT-PCR (Flu A&B, Covid) Nasopharyngeal Swab     Status: None   Collection Time: 07/28/20 10:11 PM   Specimen: Nasopharyngeal Swab; Nasopharyngeal(NP) swabs in vial transport medium  Result Value Ref Range   SARS Coronavirus 2 by RT PCR NEGATIVE NEGATIVE   Influenza A by PCR NEGATIVE NEGATIVE   Influenza B by PCR NEGATIVE NEGATIVE  CBC with Differential/Platelet     Status: Abnormal   Collection Time: 07/28/20 10:38 PM  Result Value Ref Range   WBC 8.8 4.0 - 10.5 K/uL   RBC 3.79 (L) 3.87 - 5.11 MIL/uL   Hemoglobin 11.4 (L) 12.0 - 15.0 g/dL   HCT 08.6 (L) 57.8 - 46.9 %   MCV 88.9 80.0 - 100.0 fL   MCH 30.1 26.0 - 34.0 pg   MCHC 33.8 30.0 - 36.0 g/dL   RDW 62.9 52.8 - 41.3 %   Platelets 194 150 - 400 K/uL   nRBC 0.0 0.0 - 0.2 %   Neutrophils Relative % 83 %   Neutro Abs 7.4 1.7 - 7.7 K/uL   Lymphocytes Relative 10 %   Lymphs Abs 0.9 0.7 - 4.0 K/uL   Monocytes Relative 6 %   Monocytes Absolute 0.5 0.1 - 1.0 K/uL   Eosinophils Relative 0 %   Eosinophils Absolute 0.0 0.0 - 0.5 K/uL   Basophils Relative 0 %   Basophils Absolute 0.0 0.0 - 0.1 K/uL   Immature Granulocytes 1 %   Abs Immature Granulocytes 0.08 (H) 0.00 - 0.07 K/uL  Comprehensive metabolic panel     Status: Abnormal   Collection Time: 07/28/20 10:38 PM  Result Value Ref Range   Sodium 132 (L) 135 - 145 mmol/L   Potassium 3.7 3.5 - 5.1 mmol/L   Chloride 104 98 - 111 mmol/L   CO2 18 (L) 22 - 32 mmol/L   Glucose, Bld 103 (H) 70 - 99 mg/dL   BUN 8 6 - 20 mg/dL   Creatinine, Ser 2.44 0.44 - 1.00 mg/dL   Calcium 8.5 (L) 8.9 - 10.3 mg/dL   Total Protein 6.1 (L) 6.5 - 8.1 g/dL   Albumin 2.4 (L) 3.5 - 5.0 g/dL   AST 25 15 -  41 U/L   ALT 24 0 - 44 U/L   Alkaline Phosphatase 122 38 - 126 U/L  Total Bilirubin 0.6 0.3 - 1.2 mg/dL   GFR, Estimated >02 >72 mL/min   Anion gap 10 5 - 15  Brain natriuretic peptide     Status: None   Collection Time: 07/28/20 10:38 PM  Result Value Ref Range   B Natriuretic Peptide 27.5 0.0 - 100.0 pg/mL  Troponin I (High Sensitivity)     Status: None   Collection Time: 07/28/20 10:38 PM  Result Value Ref Range   Troponin I (High Sensitivity) 4 <18 ng/L  C-reactive protein     Status: Abnormal   Collection Time: 07/28/20 10:38 PM  Result Value Ref Range   CRP 2.5 (H) <1.0 mg/dL  D-dimer, quantitative (not at [facility])     Status: Abnormal   Collection Time: 07/28/20 10:38 PM  Result Value Ref Range   D-Dimer, Quant 1.40 (H) 0.00 - 0.50 ug/mL-FEU  Fibrinogen     Status: Abnormal   Collection Time: 07/28/20 10:38 PM  Result Value Ref Range   Fibrinogen 525 (H) 210 - 475 mg/dL  Ferritin     Status: Abnormal   Collection Time: 07/28/20 10:38 PM  Result Value Ref Range   Ferritin 10 (L) 11 - 307 ng/mL   12lead EKG showed Sinus Rhythm  B/Positive/-- (12/13 1038)  Imaging:  DG Chest Portable 1 View  Result Date: 07/28/2020 CLINICAL DATA:  Hypoxia, fever, congestion EXAM: PORTABLE CHEST 1 VIEW COMPARISON:  None FINDINGS: Single frontal view of the chest demonstrates an unremarkable cardiac silhouette. No airspace disease, effusion, or pneumothorax. No acute bony abnormalities. IMPRESSION: 1. No acute intrathoracic process. Electronically Signed   By: Sharlet Salina M.D.   On: 07/28/2020 22:54   MAU Course/MDM: I have ordered labs and reviewed results.  Covid and flu tests are negative.  NST reviewed, reassuring Consult Dr Shawnie Pons with presentation, exam findings and test results. While waiting for above results, we proceeded as if this was covid pneumonia.   We gave Remdesivir and SoluMedrol  We also gave Tylenol for fever.  FHR subsequently came down within range and patient felt  very much improved.  She was no longer hypoxic when out of bed and felt less ill     Fever came down  Some of the test results were elevated c/w pregnancy and CRP was elevated  Discussed conservative care, hydration, Chloroseptic spray for throat, Tylenol for fever and pain. Has appt next week in office  Assessment: Single IUP at [redacted]w[redacted]d Viral upper respiratory tract infection - Plan: Discharge patient  Sore throat - Plan: Discharge patient  Plan: Discharge home Tylenol for pain and fever Preterm Labor precautions and fetal kick counts Follow up in Office for prenatal visits  Encouraged to return if she develops worsening of symptoms, increase in pain, fever, or other concerning symptoms.   Pt stable at time of discharge.  Wynelle Bourgeois CNM, MSN Certified Nurse-Midwife 07/28/2020 9:56 PM

## 2020-07-29 DIAGNOSIS — J069 Acute upper respiratory infection, unspecified: Secondary | ICD-10-CM | POA: Diagnosis present

## 2020-07-29 LAB — PROCALCITONIN: Procalcitonin: <0.1 ng/mL

## 2020-07-29 NOTE — Discharge Instructions (Signed)
Sore Throat When you have a sore throat, your throat may feel:  Tender.  Burning.  Irritated.  Scratchy.  Painful when you swallow.  Painful when you talk. Many things can cause a sore throat, such as:  An infection.  Allergies.  Dry air.  Smoke or pollution.  Radiation treatment.  Gastroesophageal reflux disease (GERD).  A tumor. A sore throat can be the first sign of another sickness. It can happen with other problems, like:  Coughing.  Sneezing.  Fever.  Swelling in the neck. Most sore throats go away without treatment. Follow these instructions at home:  Take over-the-counter medicines only as told by your doctor. ? If your child has a sore throat, do not give your child aspirin.  Drink enough fluids to keep your pee (urine) pale yellow.  Rest when you feel you need to.  To help with pain: ? Sip warm liquids, such as broth, herbal tea, or warm water. ? Eat or drink cold or frozen liquids, such as frozen ice pops. ? Gargle with a salt-water mixture 3-4 times a day or as needed. To make a salt-water mixture, add -1 tsp (3-6 g) of salt to 1 cup (237 mL) of warm water. Mix it until you cannot see the salt anymore. ? Suck on hard candy or throat lozenges. ? Put a cool-mist humidifier in your bedroom at night. ? Sit in the bathroom with the door closed for 5-10 minutes while you run hot water in the shower.  Do not use any products that contain nicotine or tobacco, such as cigarettes, e-cigarettes, and chewing tobacco. If you need help quitting, ask your doctor.  Wash your hands well and often with soap and water. If soap and water are not available, use hand sanitizer.      Contact a doctor if:  You have a fever for more than 2-3 days.  You keep having symptoms for more than 2-3 days.  Your throat does not get better in 7 days.  You have a fever and your symptoms suddenly get worse.  Your child who is 3 months to 66 years old has a temperature of  102.8F (39C) or higher. Get help right away if:  You have trouble breathing.  You cannot swallow fluids, soft foods, or your saliva.  You have swelling in your throat or neck that gets worse.  You keep feeling sick to your stomach (nauseous).  You keep throwing up (vomiting). Summary  A sore throat is pain, burning, irritation, or scratchiness in the throat. Many things can cause a sore throat.  Take over-the-counter medicines only as told by your doctor. Do not give your child aspirin.  Drink plenty of fluids, and rest as needed.  Contact a doctor if your symptoms get worse or your sore throat does not get better within 7 days. This information is not intended to replace advice given to you by your health care provider. Make sure you discuss any questions you have with your health care provider. Document Revised: 10/28/2017 Document Reviewed: 10/28/2017 Elsevier Patient Education  2021 Elsevier Inc.   Upper Respiratory Infection, Adult An upper respiratory infection (URI) affects the nose, throat, and upper air passages. URIs are caused by germs (viruses). The most common type of URI is often called "the common cold." Medicines cannot cure URIs, but you can do things at home to relieve your symptoms. URIs usually get better within 7-10 days. Follow these instructions at home: Activity  Rest as needed.  If you  have a fever, stay home from work or school until your fever is gone, or until your doctor says you may return to work or school. ? You should stay home until you cannot spread the infection anymore (you are not contagious). ? Your doctor may have you wear a face mask so you have less risk of spreading the infection. Relieving symptoms  Gargle with a salt-water mixture 3-4 times a day or as needed. To make a salt-water mixture, completely dissolve -1 tsp of salt in 1 cup of warm water.  Use a cool-mist humidifier to add moisture to the air. This can help you breathe  more easily. Eating and drinking  Drink enough fluid to keep your pee (urine) pale yellow.  Eat soups and other clear broths.   General instructions  Take over-the-counter and prescription medicines only as told by your doctor. These include cold medicines, fever reducers, and cough suppressants.  Do not use any products that contain nicotine or tobacco. These include cigarettes and e-cigarettes. If you need help quitting, ask your doctor.  Avoid being where people are smoking (avoid secondhand smoke).  Make sure you get regular shots and get the flu shot every year.  Keep all follow-up visits as told by your doctor. This is important.   How to avoid spreading infection to others  Wash your hands often with soap and water. If you do not have soap and water, use hand sanitizer.  Avoid touching your mouth, face, eyes, or nose.  Cough or sneeze into a tissue or your sleeve or elbow. Do not cough or sneeze into your hand or into the air.   Contact a doctor if:  You are getting worse, not better.  You have any of these: ? A fever. ? Chills. ? Brown or red mucus in your nose. ? Yellow or brown fluid (discharge)coming from your nose. ? Pain in your face, especially when you bend forward. ? Swollen neck glands. ? Pain with swallowing. ? White areas in the back of your throat. Get help right away if:  You have shortness of breath that gets worse.  You have very bad or constant: ? Headache. ? Ear pain. ? Pain in your forehead, behind your eyes, and over your cheekbones (sinus pain). ? Chest pain.  You have long-lasting (chronic) lung disease along with any of these: ? Wheezing. ? Long-lasting cough. ? Coughing up blood. ? A change in your usual mucus.  You have a stiff neck.  You have changes in your: ? Vision. ? Hearing. ? Thinking. ? Mood. Summary  An upper respiratory infection (URI) is caused by a germ called a virus. The most common type of URI is often called  "the common cold."  URIs usually get better within 7-10 days.  Take over-the-counter and prescription medicines only as told by your doctor. This information is not intended to replace advice given to you by your health care provider. Make sure you discuss any questions you have with your health care provider. Document Revised: 02/04/2020 Document Reviewed: 02/04/2020 Elsevier Patient Education  2021 [employer].

## 2020-08-02 ENCOUNTER — Other Ambulatory Visit: Payer: Self-pay | Admitting: *Deleted

## 2020-08-02 DIAGNOSIS — O0990 Supervision of high risk pregnancy, unspecified, unspecified trimester: Secondary | ICD-10-CM

## 2020-08-03 ENCOUNTER — Ambulatory Visit (INDEPENDENT_AMBULATORY_CARE_PROVIDER_SITE_OTHER): Payer: Medicaid Other | Admitting: Advanced Practice Midwife

## 2020-08-03 ENCOUNTER — Inpatient Hospital Stay (HOSPITAL_BASED_OUTPATIENT_CLINIC_OR_DEPARTMENT_OTHER): Payer: Medicaid Other

## 2020-08-03 ENCOUNTER — Encounter (HOSPITAL_COMMUNITY): Payer: Self-pay | Admitting: Obstetrics and Gynecology

## 2020-08-03 ENCOUNTER — Other Ambulatory Visit: Payer: Self-pay

## 2020-08-03 ENCOUNTER — Inpatient Hospital Stay (HOSPITAL_COMMUNITY)
Admission: AD | Admit: 2020-08-03 | Discharge: 2020-08-03 | Disposition: A | Discharge status: Home / Self Care | Payer: Medicaid Other | Attending: Obstetrics and Gynecology | Admitting: Obstetrics and Gynecology

## 2020-08-03 ENCOUNTER — Encounter: Payer: Self-pay | Admitting: Advanced Practice Midwife

## 2020-08-03 ENCOUNTER — Other Ambulatory Visit: Payer: Medicaid Other

## 2020-08-03 VITALS — BP 104/54 | HR 95 | Wt 164.7 lb

## 2020-08-03 DIAGNOSIS — O0990 Supervision of high risk pregnancy, unspecified, unspecified trimester: Secondary | ICD-10-CM | POA: Diagnosis not present

## 2020-08-03 DIAGNOSIS — O2203 Varicose veins of lower extremity in pregnancy, third trimester: Secondary | ICD-10-CM | POA: Diagnosis not present

## 2020-08-03 DIAGNOSIS — Z789 Other specified health status: Secondary | ICD-10-CM

## 2020-08-03 DIAGNOSIS — Z3A28 28 weeks gestation of pregnancy: Secondary | ICD-10-CM | POA: Diagnosis not present

## 2020-08-03 DIAGNOSIS — R600 Localized edema: Secondary | ICD-10-CM | POA: Diagnosis present

## 2020-08-03 DIAGNOSIS — M7989 Other specified soft tissue disorders: Secondary | ICD-10-CM

## 2020-08-03 DIAGNOSIS — I83891 Varicose veins of right lower extremities with other complications: Secondary | ICD-10-CM | POA: Diagnosis not present

## 2020-08-03 DIAGNOSIS — Z79899 Other long term (current) drug therapy: Secondary | ICD-10-CM | POA: Insufficient documentation

## 2020-08-03 DIAGNOSIS — O0932 Supervision of pregnancy with insufficient antenatal care, second trimester: Secondary | ICD-10-CM

## 2020-08-03 DIAGNOSIS — Z9889 Other specified postprocedural states: Secondary | ICD-10-CM

## 2020-08-03 LAB — URINALYSIS, ROUTINE W REFLEX MICROSCOPIC
Bilirubin Urine: NEGATIVE
Glucose, UA: NEGATIVE mg/dL
Hgb urine dipstick: NEGATIVE
Ketones, ur: NEGATIVE mg/dL
Nitrite: NEGATIVE
Protein, ur: NEGATIVE mg/dL
Specific Gravity, Urine: 1.017 (ref 1.005–1.030)
pH: 6 (ref 5.0–8.0)

## 2020-08-03 MED ORDER — PANTOPRAZOLE SODIUM 40 MG PO TBEC: 40.0000 mg | DELAYED_RELEASE_TABLET | Freq: Every day | ORAL | 3 refills | Status: DC

## 2020-08-03 NOTE — MAU Note (Signed)
Patient sent for NST and venous study to assess right lower leg swelling.  Also reports decreased FM over the past week.  Reports pain itching & burning in both legs but the swelling is significantly worse in her right leg.  States the severity increased over the past week.  Denies VB/LOF. Some abdominal and lower back pain when she moves around too much.

## 2020-08-03 NOTE — Progress Notes (Signed)
   PRENATAL VISIT NOTE  Subjective:  Diana Hopkins is a 25 y.o. [phone] at [redacted]w[redacted]d being seen today for ongoing prenatal care.  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  Patient reports right leg pain and swelling .  Contractions: Not present. Vag. Bleeding: None.  Movement: (!) Decreased. Denies leaking of fluid.   The following portions of the patient's history were reviewed and updated as appropriate: allergies, current medications, past family history, past medical history, past social history, past surgical history and problem list.   Objective:   Vitals:   08/03/20 1000  BP: (!) 104/54  Pulse: 95  Weight: 164 lb 11.2 oz (74.7 kg)    Fetal Status: Fetal Heart Rate (bpm): 142   Movement: (!) Decreased     General:  Alert, oriented and cooperative. Patient is in no acute distress.  Skin: Skin is warm and dry. No rash noted.   Cardiovascular: Normal heart rate noted  Respiratory: Normal respiratory effort, no problems with respiration noted  Abdomen: Soft, gravid, appropriate for gestational age.  Pain/Pressure: Present     Pelvic: Cervical exam deferred        Extremities: Normal range of motion.  Edema: Trace  Mental Status: Normal mood and affect. Normal behavior. Normal judgment and thought content.   Assessment and Plan:  Pregnancy: [id number] at [redacted]w[redacted]d 1. Supervision of high risk pregnancy, antepartum - Patient reports that she has had burning pain in her right leg. She reports that deep pressure helps with the pain, and sometimes she feel burning into her foot. She states that she had this issue prior to pregnancy.  - right leg measures 41cm and left leg measures  39 cm - Patient also had a fall and injured her right arm, and she would like to see ortho about this. As she has continued to have a lot of problems with it with diminished function and pain. She reports that she had multiple surgeries in that arm, but has never regained full function.   - will refer to ortho   2. Late prenatal care affecting pregnancy in second trimester   3. [redacted] weeks gestation of pregnancy - Patient reports that she was feeling the baby move morning and night, but less when she was working. Now she is not feeling the baby move very much in the morning and night.   - Patient go to MAU for venous duplex study and NST  Preterm labor symptoms and general obstetric precautions including but not limited to vaginal bleeding, contractions, leaking of fluid and fetal movement were reviewed in detail with the patient. Please refer to After Visit Summary for other counseling recommendations.   Return in about 2 weeks (around 08/17/2020).  Future Appointments  Date Time Provider Department Center  08/08/2020  2:15 PM [facility] HEALTH CLINICIAN [facility]  08/11/2020  3:30 PM Herschel Senegal [facility]  09/02/2020  8:30 AM [facility] NURSE [facility]  09/02/2020  8:45 AM [facility] US4 [facility]    Thressa Sheller DNP, CNM  08/03/20  10:22 AM

## 2020-08-03 NOTE — Progress Notes (Signed)
States benadryl not helping her sleep. C/o pain in lower right  leg and burning in right foot.164.7lb  States pressure with her hands helps relieve pain.  C/o feels like baby moving less since [place] on 07/28/20. C/o still feels baby move in morning and night like usual, but feels like it is less.  Dajae Kizer,RN

## 2020-08-03 NOTE — Progress Notes (Addendum)
Addendum: does have food insecurity. Given food bag and snack for today since c/o hunger from completing glucola test today. Per provider needs to go to [facility] MAU for evaluation for possible DVT and decreased fetal movement. Patient reports neighbor dropped them off and does not have a ride. Called [facility] to take patient to [facility]. Patient signed rider waiver. Claude Waldman,RN

## 2020-08-03 NOTE — Social Work (Signed)
CSW verbally consulted by RN and informed patient will need a ride home once medically cleared for discharge. CSW provided RN with a taxi voucher for MOB transportation home at discharge.  Manfred Arch, MSW, [employer] Clinical Social Work [employer] and [employer] [phone]

## 2020-08-03 NOTE — CV Procedure (Signed)
RLE venous duplex completed. Raelyn Mora, CNM  Results can be found under chart review under CV PROC. 08/03/2020 3:14 PM Dinora Hemm RVT, RDMS

## 2020-08-03 NOTE — MAU Note (Signed)
Patient reports that her baby is moving very frequently now, she just forgot to push the fetal movement button.

## 2020-08-03 NOTE — MAU Note (Signed)
Discharge instructions reviewed with [name]. Signed paper and requesting to leave. [employer] voucher given to patient. I called and requested the taxi, ETA 10 minutes.

## 2020-08-03 NOTE — MAU Provider Note (Addendum)
History    CSN: [id number]  Arrival date and time: 08/03/20 1113   Event Date/Time   First Provider Initiated Contact with Patient 08/03/20 1151      Chief Complaint  Patient presents with  . Leg Swelling   25 year-old [id number] female at [id number] presented to MAU for evaluation of RLE pain and swelling. Patient reported a 2 week history of burning/dull pain radiating from the R knee to the R foot. Pain is aggravated by ambulation, sleeping on her R side and cold temperatures and is alleviated with warm compresses, shifting to her L side and massaging the leg. She endorsed a chronic history of back pain with bilateral lower extremity numbness and tingling. She denies recent changes in activities or trauma. Patient denied chest pain, shortness of breath, palpitations, headache, weakness, blurry vision, n/v/d.    OB History     Gravida  6   Para  2   Term  2   Preterm      AB  3   Living  2      SAB  3   IAB      Ectopic      Multiple      Live Births  2           Past Medical History:  Diagnosis Date  . Medical history non-contributory     Past Surgical History:  Procedure Laterality Date  . arm surgery      History reviewed. No pertinent family history.  Social History   Tobacco Use  . Smoking status: Never Smoker  . Smokeless tobacco: Never Used  Vaping Use  . Vaping Use: Never used  Substance Use Topics  . Alcohol use: Never  . Drug use: Never    Allergies: No Known Allergies  Medications Prior to Admission  Medication Sig Dispense Refill Last Dose  . acetaminophen (TYLENOL 8 HOUR) 650 MG CR tablet Take 1 tablet (650 mg total) by mouth every 6 (six) hours as needed for pain. 30 tablet 0   . diphenhydrAMINE (BENADRYL ALLERGY ULTRATABS) 25 MG tablet Take 1 tablet (25 mg total) by mouth at bedtime as needed. 30 tablet 0   . miconazole (MICONAZOLE 7) 2 % vaginal cream Place 1 Applicatorful vaginally at bedtime. 45 g 0   . pantoprazole (PROTONIX) 40  MG tablet Take 1 tablet (40 mg total) by mouth daily. 30 tablet 3   . Prenatal Vit-Fe Fumarate-FA (PRENATAL MULTIVITAMIN) TABS tablet Take 1 tablet by mouth daily at 12 noon.     . Prenatal Vit-Fe Fumarate-FA (PRENATAL VITAMINS) 28-0.8 MG TABS Take 1 tablet by mouth daily. 90 tablet 2     Review of Systems  Constitutional: Negative for activity change, chills and fever.  Respiratory: Negative for chest tightness and shortness of breath.   Cardiovascular: Positive for leg swelling. Negative for chest pain and palpitations.  Gastrointestinal: Negative for abdominal pain, constipation, nausea and vomiting.  Genitourinary: Negative for pelvic pain, vaginal bleeding and vaginal discharge.  Neurological: Negative for dizziness, weakness, light-headedness and headaches.   Physical Exam   Blood pressure (!) 100/50, pulse 82, temperature 97.9 F (36.6 C), temperature source Oral, resp. rate 14, SpO2 100 %.  Physical Exam Constitutional:      General: She is not in acute distress.    Appearance: Normal appearance.  Cardiovascular:     Heart sounds: Normal heart sounds. No murmur heard. No friction rub. No gallop.   Pulmonary:     Effort: Pulmonary  effort is normal. No respiratory distress.     Breath sounds: Normal breath sounds. No stridor. No wheezing or rhonchi.  Musculoskeletal:        General: No tenderness.     Right lower leg: Edema present.  Skin:    General: Skin is warm.     Findings: No erythema.  Neurological:     General: No focal deficit present.     Mental Status: She is alert and oriented to person, place, and time.  Psychiatric:        Mood and Affect: Mood normal.        Behavior: Behavior normal.     Results for orders placed or performed during the hospital encounter of 08/03/20 (from the past 24 hour(s))  Urinalysis, Routine w reflex microscopic Urine, Clean Catch     Status: Abnormal   Collection Time: 08/03/20 12:08 PM  Result Value Ref Range   Color, Urine  AMBER (A) YELLOW   APPearance HAZY (A) CLEAR   Specific Gravity, Urine 1.017 1.005 - 1.030   pH 6.0 5.0 - 8.0   Glucose, UA NEGATIVE NEGATIVE mg/dL   Hgb urine dipstick NEGATIVE NEGATIVE   Bilirubin Urine NEGATIVE NEGATIVE   Ketones, ur NEGATIVE NEGATIVE mg/dL   Protein, ur NEGATIVE NEGATIVE mg/dL   Nitrite NEGATIVE NEGATIVE   Leukocytes,Ua TRACE (A) NEGATIVE   RBC / HPF 0-5 0 - 5 RBC/hpf   WBC, UA 0-5 0 - 5 WBC/hpf   Bacteria, UA RARE (A) NONE SEEN   Squamous Epithelial / LPF 6-10 0 - 5   Mucus PRESENT    VAS [place] LOWER EXTREMITY VENOUS (DVT) (ONLY [facility] & [facility])  Result Date: 08/03/2020  Lower Venous DVT Study Indications: Swelling.  Risk Factors: Pregnancy. Comparison Study: No previous exams Performing Technologist: Ernestene Mention  Examination Guidelines: A complete evaluation includes B-mode imaging, spectral Doppler, color Doppler, and power Doppler as needed of all accessible portions of each vessel. Bilateral testing is considered an integral part of a complete examination. Limited examinations for reoccurring indications may be performed as noted. The reflux portion of the exam is performed with the patient in reverse Trendelenburg.  +---------+---------------+---------+-----------+----------+--------------+ RIGHT    CompressibilityPhasicitySpontaneityPropertiesThrombus Aging +---------+---------------+---------+-----------+----------+--------------+ CFV      Full           Yes      Yes                                 +---------+---------------+---------+-----------+----------+--------------+ SFJ      Full                                                        +---------+---------------+---------+-----------+----------+--------------+ FV Prox  Full           Yes      Yes                                 +---------+---------------+---------+-----------+----------+--------------+ FV Mid   Full           Yes      Yes                                  +---------+---------------+---------+-----------+----------+--------------+  FV DistalFull           Yes      Yes                                 +---------+---------------+---------+-----------+----------+--------------+ PFV      Full                                                        +---------+---------------+---------+-----------+----------+--------------+ POP      Full           Yes      Yes                                 +---------+---------------+---------+-----------+----------+--------------+ PTV      Full                                                        +---------+---------------+---------+-----------+----------+--------------+ PERO     Full                                                        +---------+---------------+---------+-----------+----------+--------------+   +----+---------------+---------+-----------+----------+--------------+ LEFTCompressibilityPhasicitySpontaneityPropertiesThrombus Aging +----+---------------+---------+-----------+----------+--------------+ CFV Full           Yes      Yes                                 +----+---------------+---------+-----------+----------+--------------+     Summary: RIGHT: - There is no evidence of deep vein thrombosis in the lower extremity. - There is no evidence of superficial venous thrombosis.  - No cystic structure found in the popliteal fossa. Varicose veins seen in RLE calf - area of swelling - all are compressible  LEFT: - No evidence of common femoral vein obstruction.  *See table(s) above for measurements and observations.    Preliminary     MAU Course  Procedures  MDM: - Patient presented with a 2 week history of R calf pain and swelling. R calf appears larger than L, however measurements are equal with no erythema or tenderness to palpation noted. Due to R calf enlargement and hypercoagulable state of pregnancy, Duplex US was ordered to rule out DVT. Etiology most likely due  to baby position compressing vessels and nerves. Patient sleeps on R side, likely causing extra fluid accumulation and pressure.    - Duplex US revealed no evidence of DVT. Varicose veins noted in the RLE calf.  - u/a revealed no evidence of infection.   Assessment and Plan   Assessment:  1. 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 She was encouraged to avoid  sleeping on the R side due to significant pain and pressure.  - Patient encouraged to return to MAU if she experiences sudden onset chest pain, shortness of breath, palpitations, significant lower extremity erythema, pain, swelling.   Raejonna L Pascarella 08/03/2020, 4:15 PM    Attestation of Supervision of Student:  I confirm that I have verified the information documented in the physician assistant student's note and that I have also personally reperformed the history, physical exam and all medical decision making activities.  I have verified that all services and findings are accurately documented in this student's note; and I agree with management and plan as outlined in the documentation. I have also made any necessary editorial changes.  Measurement of calves = 36.5 cm bilaterally; ankles = 26.25 cm bilaterally.  Discussed poor venous health in bilateral lower extremities combined with pregnancy making varicosities worse. Advised on wearing compression hosiery as noted by PA-S above.  REACTIVE NST - FHR: 140 bpm / moderate variability / accels present / decels absent / TOCO: none   Language barrier affecting health care -- [employer] Video Farsi Interpreter, Balinda Quails [phone] used for discussion of plan of care and discharge instructions.     Raelyn Mora, CNM [facility] [employer],  [facility] 08/03/2020 4:36 PM

## 2020-08-04 LAB — HIV ANTIBODY (ROUTINE TESTING W REFLEX): HIV Screen 4th Generation wRfx: NONREACTIVE

## 2020-08-04 LAB — CBC
Hematocrit: 34.7 % (ref 34.0–46.6)
Hemoglobin: 11.6 g/dL (ref 11.1–15.9)
MCH: 29.7 pg (ref 26.6–33.0)
MCHC: 33.4 g/dL (ref 31.5–35.7)
MCV: 89 fL (ref 79–97)
Platelets: 212 10*3/uL (ref 150–450)
RBC: 3.91 x10E6/uL (ref 3.77–5.28)
RDW: 12 % (ref 11.7–15.4)
WBC: 6.7 10*3/uL (ref 3.4–10.8)

## 2020-08-04 LAB — GLUCOSE TOLERANCE, 2 HOURS W/ 1HR
Glucose, 1 hour: 139 mg/dL (ref 65–179)
Glucose, 2 hour: 102 mg/dL (ref 65–152)
Glucose, Fasting: 74 mg/dL (ref 65–91)

## 2020-08-04 LAB — RPR: RPR Ser Ql: NONREACTIVE

## 2020-08-08 ENCOUNTER — Ambulatory Visit (INDEPENDENT_AMBULATORY_CARE_PROVIDER_SITE_OTHER): Payer: Medicaid Other | Admitting: Clinical

## 2020-08-08 DIAGNOSIS — F4322 Adjustment disorder with anxiety: Secondary | ICD-10-CM | POA: Diagnosis not present

## 2020-08-09 DIAGNOSIS — Z419 Encounter for procedure for purposes other than remedying health state, unspecified: Secondary | ICD-10-CM | POA: Diagnosis not present

## 2020-08-11 ENCOUNTER — Ambulatory Visit: Payer: Medicaid Other | Attending: Medical

## 2020-08-12 ENCOUNTER — Ambulatory Visit (INDEPENDENT_AMBULATORY_CARE_PROVIDER_SITE_OTHER): Payer: Medicaid Other | Admitting: Orthopaedic Surgery

## 2020-08-12 ENCOUNTER — Ambulatory Visit (INDEPENDENT_AMBULATORY_CARE_PROVIDER_SITE_OTHER): Payer: Medicaid Other

## 2020-08-12 ENCOUNTER — Encounter: Payer: Self-pay | Admitting: Orthopaedic Surgery

## 2020-08-12 DIAGNOSIS — M79631 Pain in right forearm: Secondary | ICD-10-CM

## 2020-08-12 NOTE — Progress Notes (Signed)
Office Visit Note   Patient: Diana Hopkins           Date of Birth: Nov 17, 1995           MRN: [id number] Visit Date: 08/12/2020              Requested by: Armando Reichert, CNM [address] [place],  [place] [address] PCP: Patient, No Pcp Per   Assessment & Plan: Visit Diagnoses:  1. Right forearm pain     Plan: Impression is right forearm symptomatic hardware.  We did discuss various treatment options and for now she would like to try topical anti-inflammatories as she is currently pregnant.  She is not interested in undergo any surgeries to remove the hardware even after she gives birth.  She will follow up with [place] as needed.  Today's encounter was performed through the language interpreter.    Total face to face encounter time was greater than 45 minutes and over half of this time was spent in counseling and/or coordination of care.  Follow-Up Instructions: Return if symptoms worsen or fail to improve.   Orders:  Orders Placed This Encounter  Procedures  . XR Wrist Complete Right  . XR Forearm Right   No orders of the defined types were placed in this encounter.     Procedures: No procedures performed   Clinical Data: No additional findings.   Subjective: Chief Complaint  Patient presents with  . Right Forearm - Pain    HPI patient is a pleasant 25 year old pregnant female who comes in today with an interpreter.  She is here with right volar forearm pain.  She notes that when she was 25 years old she suffered a Monteggia fracture which required surgical intervention.  She has had pain to the volar forearm since.  The pain she has occurs when there is pressure applied to the area.  She also notes slight decrease sensation to the forearm.  Review of Systems as detailed in HPI.  All others reviewed and are negative.   Objective: Vital Signs: There were no vitals taken for this visit.  Physical Exam well-developed well-nourished female no acute  distress.  Alert oriented x3.  Ortho Exam right forearm exam shows a fully healed surgical scar without complication.  She does have near full flexion extension of the elbow and wrist but lacks a fair amount of pronation.  She has moderate tenderness to the volar forearm just over the hardware.  She is neurovascular intact distally.  Specialty Comments:  No specialty comments available.  Imaging: XR Wrist Complete Right  Result Date: 08/12/2020  X-rays demonstrate previous ORIF for a Monteggia fracture without hardware failure.  Radial height shortening.  She is also ulnar positive.    PMFS History: Patient Active Problem List   Diagnosis Date Noted  . Viral URI 07/29/2020  . Late prenatal care affecting pregnancy in second trimester 06/08/2020  . UTI (urinary tract infection) during pregnancy 05/30/2020  . Supervision of high risk pregnancy, antepartum 05/23/2020  . 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 H/O hand surgery 05/23/2020   Past Medical History:  Diagnosis Date  . Medical history non-contributory     History reviewed. No pertinent family history.  Past Surgical History:  Procedure Laterality Date  . arm surgery     Social History   Occupational History  . Not on file  Tobacco Use  . Smoking status: Never Smoker  . Smokeless tobacco: Never Used  Vaping Use  . Vaping Use: Never used  Substance and Sexual Activity  . Alcohol use: Never  . Drug use: Never  . Sexual activity: Yes    Birth control/protection: None

## 2020-08-22 ENCOUNTER — Encounter: Payer: Self-pay | Admitting: Nurse Practitioner

## 2020-08-22 ENCOUNTER — Ambulatory Visit (INDEPENDENT_AMBULATORY_CARE_PROVIDER_SITE_OTHER): Payer: Medicaid Other | Admitting: Nurse Practitioner

## 2020-08-22 ENCOUNTER — Other Ambulatory Visit: Payer: Self-pay

## 2020-08-22 VITALS — BP 105/70 | HR 102 | Wt 168.6 lb

## 2020-08-22 DIAGNOSIS — J069 Acute upper respiratory infection, unspecified: Secondary | ICD-10-CM

## 2020-08-22 DIAGNOSIS — Z603 Acculturation difficulty: Secondary | ICD-10-CM | POA: Insufficient documentation

## 2020-08-22 DIAGNOSIS — O0932 Supervision of pregnancy with insufficient antenatal care, second trimester: Secondary | ICD-10-CM

## 2020-08-22 DIAGNOSIS — I83893 Varicose veins of bilateral lower extremities with other complications: Secondary | ICD-10-CM | POA: Insufficient documentation

## 2020-08-22 DIAGNOSIS — Z789 Other specified health status: Secondary | ICD-10-CM

## 2020-08-22 DIAGNOSIS — O0990 Supervision of high risk pregnancy, unspecified, unspecified trimester: Secondary | ICD-10-CM

## 2020-08-22 DIAGNOSIS — Z3A31 31 weeks gestation of pregnancy: Secondary | ICD-10-CM

## 2020-08-22 MED ORDER — PANTOPRAZOLE SODIUM 40 MG PO TBEC: 40.0000 mg | DELAYED_RELEASE_TABLET | Freq: Every day | ORAL | 3 refills | Status: DC

## 2020-08-22 NOTE — BH Specialist Note (Signed)
Attempt to contact pt for virtual visit, through [employer] interpreter, [place], [phone]. Pt's husband answered the phone, said he was not near pt at this moment, but to call her on her cell phone at [phone]. Pt did not answer phone, but did leave HIPPA-compliant message for pt to call back Asher Muir at [phone] or to reschedule visit at the front desk at her next in-person appointment.

## 2020-08-22 NOTE — Progress Notes (Signed)
Visit scheduled virtual but patient arrived for in person visit. C/o burning/ pain in lower legs.  C/o bump in private area. States vaginal cream did not help. Johnnie Goynes,RN

## 2020-08-22 NOTE — Patient Instructions (Addendum)
Get compression socks at [facility] in the Diabetes section near the pharmacy.  Use a body lotion on her legs and arms for dry skin.  Continue to use the vaginal cream on the folds near the vagina.  Pick up the medicine at the pharmacy for her her heartburn.  Tums - can get at the pharmacy and can chew up as many as she wants.   Varicose Veins Varicose veins are veins that have become enlarged, bulged, and twisted. They most often appear in the legs. What are the causes? This condition is caused by damage to the valves in the vein. These valves help blood return to your heart. When they are damaged and they stop working properly, blood may flow backward and back up in the veins near the skin, causing the veins to get larger and appear twisted. The condition can result from any issue that causes blood to back up, like pregnancy, prolonged standing, or obesity. What increases the risk? This condition is more likely to develop in people who are:  On their feet a lot.  Pregnant.  Overweight. What are the signs or symptoms? Symptoms of this condition include:  Bulging, twisted, and bluish veins.  A feeling of heaviness. This may be worse at the end of the day.  Leg pain. This may be worse at the end of the day.  Swelling in the leg.  Changes in skin color over the veins. How is this diagnosed? This condition may be diagnosed based on your symptoms, a physical exam, and an ultrasound test. How is this treated? Treatment for this condition may involve:  Avoiding sitting or standing in one position for long periods of time.  Wearing compression stockings. These stockings help to prevent blood clots and reduce swelling in the legs.  Raising (elevating) the legs when resting.  Losing weight.  Exercising regularly. If you have persistent symptoms or want to improve the way your varicose veins look, you may choose to have a procedure to close the varicose veins off or to remove  them. Treatments to close off the veins include:  Sclerotherapy. In this treatment, a solution is injected into a vein to close it off.  Laser treatment. In this treatment, the vein is heated with a laser to close it off.  Radiofrequency vein ablation. In this treatment, an electrical current produced by radio waves is used to close off the vein. Treatments to remove the veins include:  Phlebectomy. In this treatment, the veins are removed through small incisions made over the veins.  Vein ligation and stripping. In this treatment, incisions are made over the veins. The veins are then removed after being tied (ligated) with stitches (sutures). Follow these instructions at home: Activity  Walk as much as possible. Walking increases blood flow. This helps blood return to the heart and takes pressure off your veins. It also increases your cardiovascular strength.  Follow your health care provider's instructions about exercising.  Do not stand or sit in one position for a long period of time.  Do not sit with your legs crossed.  Rest with your legs raised during the day. General instructions  Follow any diet instructions given to you by your health care provider.  Wear compression stockings as directed by your health care provider. Do not wear other kinds of tight clothing around your legs, pelvis, or waist.  Elevate your legs at night to above the level of your heart.  If you get a cut in the skin over  the varicose vein and the vein bleeds: ? Lie down with your leg raised. ? Apply firm pressure to the cut with a clean cloth until the bleeding stops. ? Place a bandage (dressing) on the cut.   Contact a health care provider if:  The skin around your varicose veins starts to break down.  You have pain, redness, tenderness, or hard swelling over a vein.  You are uncomfortable because of pain.  You get a cut in the skin over a varicose vein and it will not stop  bleeding. Summary  Varicose veins are veins that have become enlarged, bulged, and twisted. They most often appear in the legs.  This condition is caused by damage to the valves in the vein. These valves help blood return to your heart.  Treatment for this condition includes frequent movements, wearing compression stockings, losing weight, and exercising regularly. In some cases, procedures are done to close off or remove the veins.  Treatment for this condition may include wearing compression stockings, elevating the legs, losing weight, and engaging in regular activity. In some cases, procedures are done to close off or remove the veins. This information is not intended to replace advice given to you by your health care provider. Make sure you discuss any questions you have with your health care provider. Document Revised: 10/08/2019 Document Reviewed: 10/08/2019 Elsevier Patient Education  2021 Elsevier Inc.  Intrauterine Device Information An intrauterine device (IUD) is a medical device that is inserted into the uterus to prevent pregnancy. It is a small, T-shaped device that has one or two nylon strings hanging down from it. The strings hang out of the lower part of the uterus (cervix) to allow for future IUD removal. There are two types of IUDs:  Hormone IUD. This type of IUD is made of plastic and contains the hormone progestin (synthetic progesterone). A hormone IUD may last 3-5 years.  Copper IUD. This type of IUD has copper wire wrapped around it. A copper IUD may last up to 10 years. How is an IUD inserted? An IUD is inserted through the vagina, through the cervix, and into the uterus with a minor medical procedure. The procedure for IUD insertion may vary among health care providers and hospitals. How does an IUD work? Synthetic progesterone in a hormonal IUD prevents pregnancy by:  Thickening cervical mucus to prevent sperm from entering the uterus.  Thinning the uterine  lining to prevent a fertilized egg from being implanted there. Copper in a copper IUD prevents pregnancy by making the uterus and fallopian tubes produce a fluid that kills sperm. What are the advantages of an IUD? Advantages of either type of IUD An IUD:  Is highly effective in preventing pregnancy.  Is reversible. You can become pregnant shortly after the IUD is removed.  Is low-maintenance and can stay in place for a long time.  Has no estrogen-related side effects.  Can be used when breastfeeding.  Is not associated with weight gain.  Can be inserted right after childbirth, an abortion, or a miscarriage. Advantages of a hormone IUD  If it is inserted within 7 days of your period starting, it works right after it has been inserted. If the hormone IUD is inserted at any other time in your cycle, you will need to use a backup method of birth control for 7 days after insertion.  It can make menstrual periods lighter or stop completely.  It can reduce menstrual cramping and other discomforts from menstrual periods.  It can be used for 3-5 years, depending on which IUD you have. Advantages of a copper IUD  It works right after it is inserted.  It can be used as a form of emergency birth control if it is inserted within 5 days after having unprotected sex.  It does not interfere with your body's natural hormones.  It can be used for up to 10 years. What are the disadvantages of an IUD?  An IUD may cause irregular menstrual bleeding for a period of time after insertion.  It is common to have pain during insertion and have cramping and vaginal bleeding after insertion.  An IUD may cut the uterus (uterine perforation) when it is inserted. This is rare.  Pelvic inflammatory disease (PID) may happen after insertion of an IUD. PID is an infection in the uterus and fallopian tubes. The IUD does not cause the infection. The infection is usually from an unknown sexually transmitted  infection (STI). This is rare, and it usually happens during the first 20 days after the IUD is inserted.  A copper IUD can make your menstrual flow heavier and more painful.  IUDs cannot prevent sexually transmitted infections (STIs). How is an IUD removed?   You will lie on your back with your knees bent and your feet in footrests (stirrups).  A device will be inserted into your vagina to spread apart the vaginal walls (speculum). This will allow your health care provider to see the strings attached to the IUD.  Your health care provider will use a small instrument (forceps) to grasp the IUD strings and will pull firmly until the IUD is removed. You may have some discomfort when the IUD is removed. Your health care provider may recommend taking over-the-counter pain relievers, such as ibuprofen, before the procedure. You may also have minor spotting for a few days after the procedure. The procedure for IUD removal may vary among health care providers and hospitals. Is an IUD right for me? If you are interested in an IUD, discuss it with your health care provider. He or she will make sure you are a good candidate for an IUD and will let you know more about the advantages, disadvantage, and possible side effects. This will allow you to make a decision about the device. Summary  An intrauterine device (IUD) is a medical device that is inserted in the uterus to prevent pregnancy. It is a small, T-shaped device that has one or two nylon strings hanging down from it.  A hormone IUD contains the hormone progestin (synthetic progesterone). A copper IUD has copper wire wrapped around it.  Synthetic progesterone in a hormone IUD prevents pregnancy by thickening cervical mucus and thinning the walls of the uterus. Copper in a copper IUD prevents pregnancy by making the uterus and fallopian tubes produce a fluid that kills sperm.  A hormone IUD can be left in place for 3-5 years. A copper IUD can be  left in place for up to 10 years.  An IUD is inserted and removed by a health care provider. You may feel some pain during insertion and removal. Your health care provider may recommend taking over-the-counter pain medicine, such as ibuprofen, before an IUD procedure. This information is not intended to replace advice given to you by your health care provider. Make sure you discuss any questions you have with your health care provider. Document Revised: 12/09/2019 Document Reviewed: 12/09/2019 Elsevier Patient Education  2021 [employer].

## 2020-08-22 NOTE — Progress Notes (Signed)
Subjective:  Diana Hopkins is a 25 y.o. [id number] at [redacted]w[redacted]d being seen today for ongoing prenatal care.  She was scheduled a virtual visit but came in person to the office today.  Children are with her today.  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  Patient reports pain in legs, swelling, itching in genital area.  Contractions: Not present.  .  Movement: Present. Denies leaking of fluid. Video interpreter used for the visit - audio only, no visual.  Husband present for a portion of the visit.  Online interpreter stopped working near the end of the visit and the husband interpreted for a short time.  The following portions of the patient's history were reviewed and updated as appropriate: allergies, current medications, past family history, past medical history, past social history, past surgical history and problem list. Problem list updated.  Objective:   Vitals:   08/22/20 1046  BP: 105/70  Pulse: (!) 102  Weight: 168 lb 9.6 oz (76.5 kg)    Fetal Status: Fetal Heart Rate (bpm): 136 Fundal Height: 29 cm Movement: Present     General:  Alert, oriented and cooperative. Patient is in no acute distress.  Skin: Skin is warm and dry. No rash noted.   Cardiovascular: Normal heart rate noted  Respiratory: Normal respiratory effort, no problems with respiration noted  Abdomen: Soft, gravid, appropriate for gestational age. Pain/Pressure: Present     Pelvic:  Cervical exam deferred        Extremities: Normal range of motion.  Edema: Trace  Mental Status: Normal mood and affect. Normal behavior. Normal  judgment and thought content.  Perineum visualized.  Tops of inner thighs irritated but not severely, no lesions near vagina.  Skin on legs very dry.  Varicose veins noted on both legs but worse on right leg.  No vulvar varicosities seen.  Client worried as all of this is worsening in this pregnancy.  Urinalysis:      Assessment and Plan:  Pregnancy: [id number] at [redacted]w[redacted]d  1. Supervision of high risk pregnancy, antepartum Advised vericose veins worsen with each pregnancy. Advised these are a condition that some women have in pregnancy. Lots of info given in AVS today. To pick up medication at her pharmacy for heartburn.  Also advised Tums for heartburn. Reports having fatigue from time to time and husband   2. Late prenatal care affecting pregnancy in second trimester  3. Viral URI Better.  No cough or rhinitis.  4.  Varicose veins Advised again to use compression socks  - knee highs or thigh highs. Advised to wear shorts to keep thighs from rubbing Advised to use body lotion daily to keep skin from itching.  Has been weak but breath sounds are clear today.  No cough. Children accompany her to visit.  They have congested cough.  Preterm labor symptoms and general obstetric precautions including but not limited to vaginal bleeding, contractions, leaking of fluid and fetal movement were reviewed in detail with the patient. Please refer to After Visit Summary for other counseling recommendations.  Return in about 2 weeks (around 09/05/2020) for in person ROB.  Nolene Bernheim, RN, MSN, NP-BC Nurse Practitioner, Faculty  [facility] for [employer], [facility] 08/22/2020 1:07 PM

## 2020-08-29 ENCOUNTER — Ambulatory Visit: Payer: Medicaid Other | Admitting: Clinical

## 2020-08-29 ENCOUNTER — Other Ambulatory Visit: Payer: Self-pay

## 2020-08-29 DIAGNOSIS — Z789 Other specified health status: Secondary | ICD-10-CM

## 2020-08-30 ENCOUNTER — Other Ambulatory Visit: Payer: Self-pay

## 2020-08-30 DIAGNOSIS — O26893 Other specified pregnancy related conditions, third trimester: Secondary | ICD-10-CM

## 2020-08-30 DIAGNOSIS — R12 Heartburn: Secondary | ICD-10-CM

## 2020-08-30 DIAGNOSIS — N898 Other specified noninflammatory disorders of vagina: Secondary | ICD-10-CM

## 2020-08-30 DIAGNOSIS — L299 Pruritus, unspecified: Secondary | ICD-10-CM

## 2020-08-30 MED ORDER — EUCERIN EX LOTN
TOPICAL_LOTION | CUTANEOUS | 0 refills | Status: DC | PRN
Start: 2020-08-30 — End: 2020-12-07

## 2020-08-30 MED ORDER — TERCONAZOLE 0.4 % VA CREA: 1.0000 | TOPICAL_CREAM | Freq: Every day | VAGINAL | 0 refills | Status: DC

## 2020-08-30 MED ORDER — DIPHENHYDRAMINE HCL 25 MG PO TABS: 25.0000 mg | ORAL_TABLET | Freq: Every evening | ORAL | 0 refills | Status: DC | PRN

## 2020-08-30 MED ORDER — PANTOPRAZOLE SODIUM 40 MG PO TBEC: 40.0000 mg | DELAYED_RELEASE_TABLET | Freq: Every day | ORAL | 3 refills | Status: DC

## 2020-08-31 NOTE — Progress Notes (Addendum)
Patient came into front office on 08/30/20 with husband and 2 children. Requests help with recent prescriptions sent to pharmacy. Husband interpreting for patient. Only recent prescription sent is Protonix for heartburn. Patient and husband asking about a cream for itching due to varicose veins; per Nolene Bernheim, NP note on 08/22/20, any body lotion is appropriate. Pt also complains of difficulty sleeping at night due to itching. Patient explains with husband's help that she is having vaginal itching and itching on legs. I explained I can send a cream specifically for vaginal itching. Husband requests this be sent to a different pharmacy for privacy; husband works in [employer] where medications are normally sent. Eucerin lotion and Benadryl ordered per Camelia Eng, NP. List of lotions given to pt if Eucerin is not covered by Medicaid. Protonix reordered to new pharmacy for convenience. Terazol vaginal cream sent per protocol for vaginal itching. Pt given instructions for all medications. Will follow up at next prenatal appt on 09/08/20.  Fleet Contras RN 08/31/20

## 2020-09-02 ENCOUNTER — Ambulatory Visit: Payer: Medicaid Other | Attending: Obstetrics and Gynecology

## 2020-09-02 ENCOUNTER — Ambulatory Visit: Payer: Medicaid Other

## 2020-09-08 ENCOUNTER — Other Ambulatory Visit: Payer: Self-pay

## 2020-09-08 ENCOUNTER — Encounter: Payer: Self-pay | Admitting: Nurse Practitioner

## 2020-09-08 ENCOUNTER — Ambulatory Visit (INDEPENDENT_AMBULATORY_CARE_PROVIDER_SITE_OTHER): Payer: Medicaid Other | Admitting: Nurse Practitioner

## 2020-09-08 VITALS — BP 91/77 | HR 88 | Wt 175.5 lb

## 2020-09-08 DIAGNOSIS — B850 Pediculosis due to Pediculus humanus capitis: Secondary | ICD-10-CM

## 2020-09-08 DIAGNOSIS — O0990 Supervision of high risk pregnancy, unspecified, unspecified trimester: Secondary | ICD-10-CM

## 2020-09-08 DIAGNOSIS — Z789 Other specified health status: Secondary | ICD-10-CM

## 2020-09-08 DIAGNOSIS — B852 Pediculosis, unspecified: Secondary | ICD-10-CM

## 2020-09-08 DIAGNOSIS — I83893 Varicose veins of bilateral lower extremities with other complications: Secondary | ICD-10-CM

## 2020-09-08 DIAGNOSIS — Z3A34 34 weeks gestation of pregnancy: Secondary | ICD-10-CM

## 2020-09-08 MED ORDER — PERMETHRIN 1 % EX LOTN: 1.0000 "application " | TOPICAL_LOTION | Freq: Once | CUTANEOUS | 1 refills | Status: AC

## 2020-09-08 MED ORDER — PERMETHRIN 5 % EX CREA: 1.0000 "application " | TOPICAL_CREAM | Freq: Once | CUTANEOUS | 1 refills | Status: DC

## 2020-09-08 NOTE — Progress Notes (Signed)
    Subjective:  Diana Hopkins is a 25 y.o. [id number] at [redacted]w[redacted]d being seen today for ongoing prenatal care.  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  Patient reports itching scalp.  Contractions: Not present. Vag. Bleeding: None.  Movement: Present. Denies leaking of fluid.   The following portions of the patient's history were reviewed and updated as appropriate: allergies, current medications, past family history, past medical history, past social history, past surgical history and problem list. Problem list updated.  Objective:   Vitals:   09/08/20 0926  BP: 91/77  Pulse: 88  Weight: 175 lb 8 oz (79.6 kg)    Fetal Status: Fetal Heart Rate (bpm): 151   Movement: Present     General:  Alert, oriented and cooperative. Patient is in no acute distress.  Skin: Skin is warm and dry. No rash noted.   Cardiovascular: Normal heart rate noted  Respiratory: Normal respiratory effort, no problems with respiration noted  Abdomen: Soft, gravid, appropriate for gestational age. Pain/Pressure: Present     Pelvic:  Cervical exam deferred        Extremities: Normal range of motion.  Edema: Mild pitting, slight indentation  Mental Status: Normal mood and affect. Normal behavior. Normal judgment and thought content.   Urinalysis:      Assessment and Plan:  Pregnancy: [id number] at [redacted]w[redacted]d  1. Supervision of high risk pregnancy, antepartum Baby moving well  BP stable More alert and talkative today than at previous visits Reviewed vaginal swabs done at next visit  2. Language barrier affecting health care IN  person interpreter present today - working very well Better communication than with audio interpreter  3. Varicose veins of both legs with edema Encouraged again to get compression [facility] high[facility] at [facility] and wear  4. Lice infested hair Reports children have had itching scalps and she reports seeing bugs in their hair and now has itching of her hair.  Children with her today and actively scratching their heads. Consult with pharmacy. Prescribed Premethrin and reviewed how to use the medication with her for each family member.  Preterm labor symptoms and general obstetric precautions including but not limited to vaginal bleeding, contractions, leaking of fluid and fetal movement were reviewed in detail with the patient. Please refer to After Visit Summary for other counseling recommendations.  Return in about 2 weeks (around 09/22/2020) for in person ROB.  Nolene Bernheim, RN, MSN, NP-BC Nurse Practitioner, [facility] for [employer], [facility] 09/08/2020 11:39 AM

## 2020-09-08 NOTE — Progress Notes (Signed)
Back pain when she lays down. Patient also complains of itchy scalp, believes her and her 2 kids have lice.

## 2020-09-09 ENCOUNTER — Ambulatory Visit: Payer: Medicaid Other | Admitting: *Deleted

## 2020-09-09 ENCOUNTER — Ambulatory Visit: Payer: Medicaid Other | Attending: Obstetrics and Gynecology

## 2020-09-09 ENCOUNTER — Encounter: Payer: Self-pay | Admitting: *Deleted

## 2020-09-09 DIAGNOSIS — O0990 Supervision of high risk pregnancy, unspecified, unspecified trimester: Secondary | ICD-10-CM | POA: Insufficient documentation

## 2020-09-09 DIAGNOSIS — J069 Acute upper respiratory infection, unspecified: Secondary | ICD-10-CM | POA: Diagnosis not present

## 2020-09-09 DIAGNOSIS — O09292 Supervision of pregnancy with other poor reproductive or obstetric history, second trimester: Secondary | ICD-10-CM | POA: Insufficient documentation

## 2020-09-09 DIAGNOSIS — Z419 Encounter for procedure for purposes other than remedying health state, unspecified: Secondary | ICD-10-CM | POA: Diagnosis not present

## 2020-09-09 IMAGING — US US MFM OB FOLLOW-UP
1 series · 15 of 28 positions shown · non-contrast
Comparison: none

[Series 1: us mfm ob follow-up · 48 acquisitions, 15 frames shown]
[im 1/48]
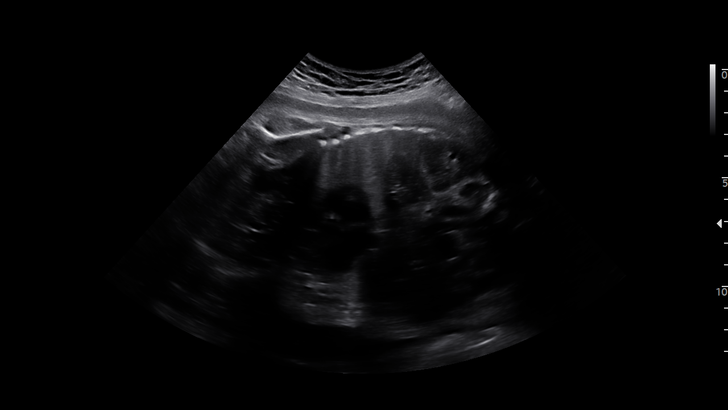
[im 4/48]
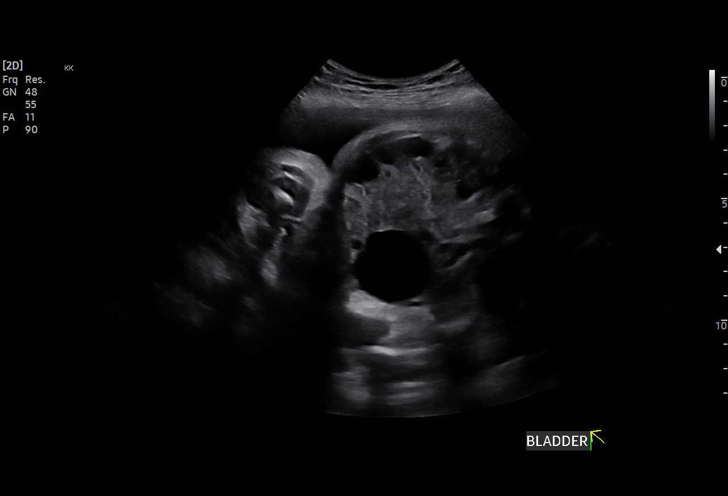
[im 7/48]
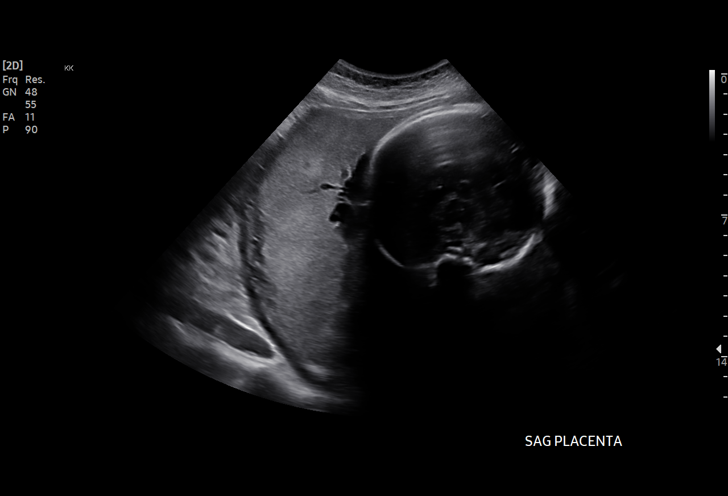
[im 11/48]
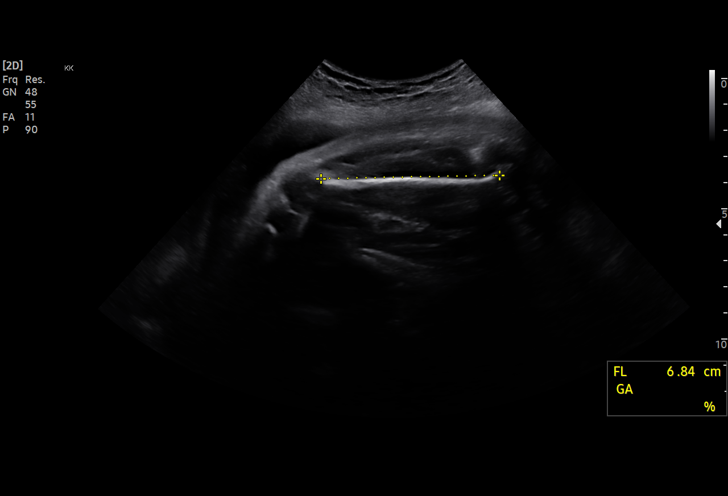
[im 14/48]
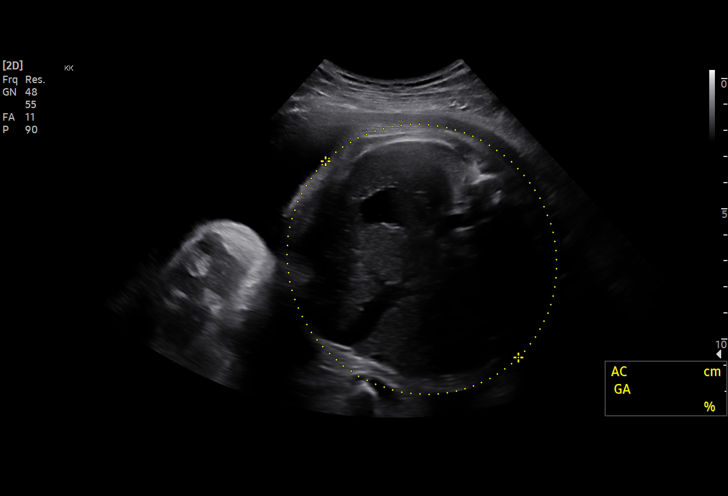
[im 18/48]
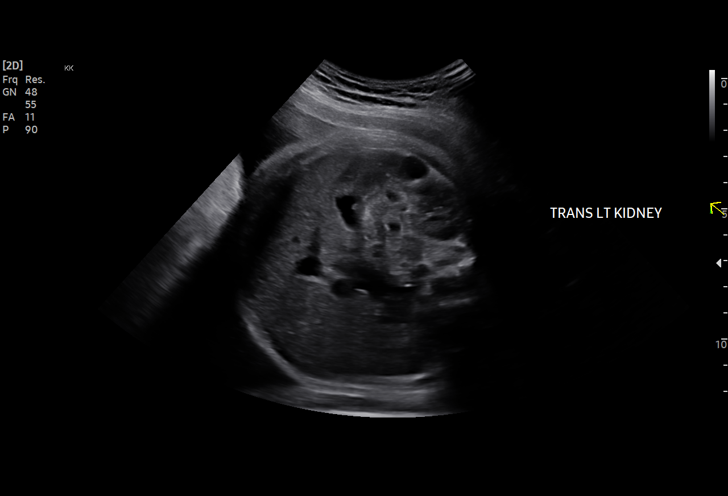
[im 21/48]
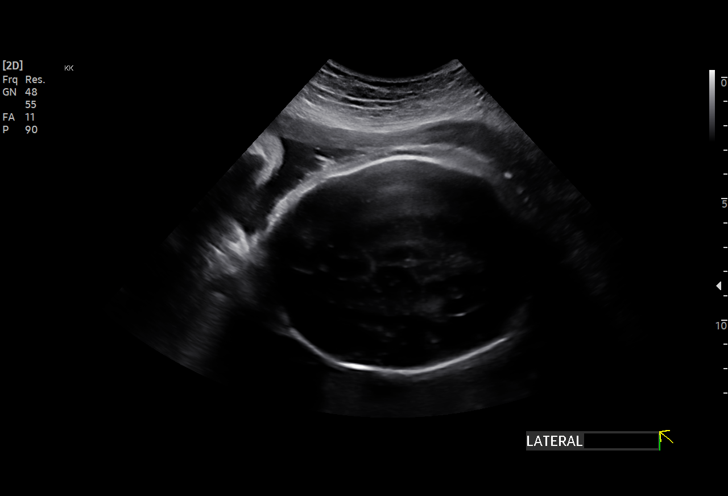
[im 25/48]
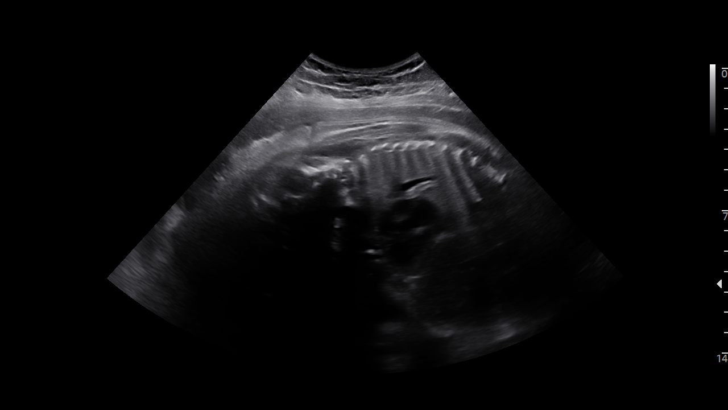
[im 27/48]
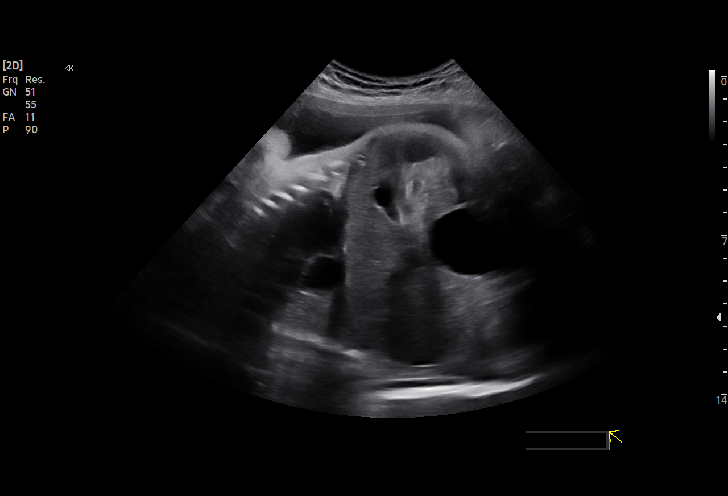
[im 30/48]
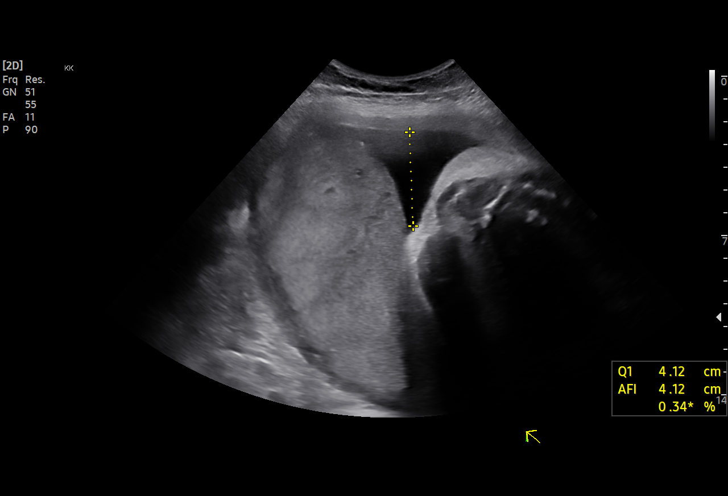
[im 34/48]
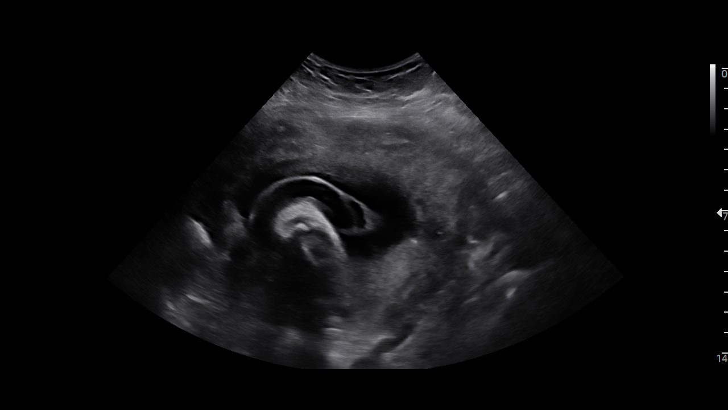
[im 37/48]
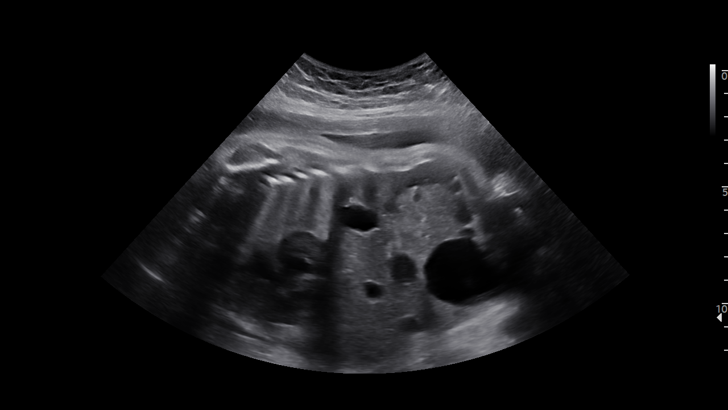
[im 41/48]
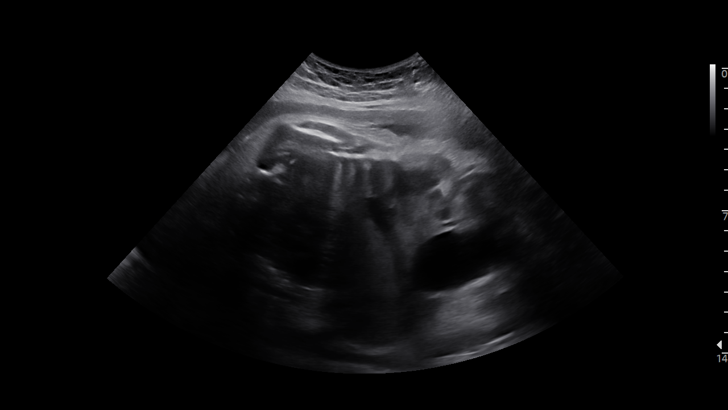
[im 44/48]
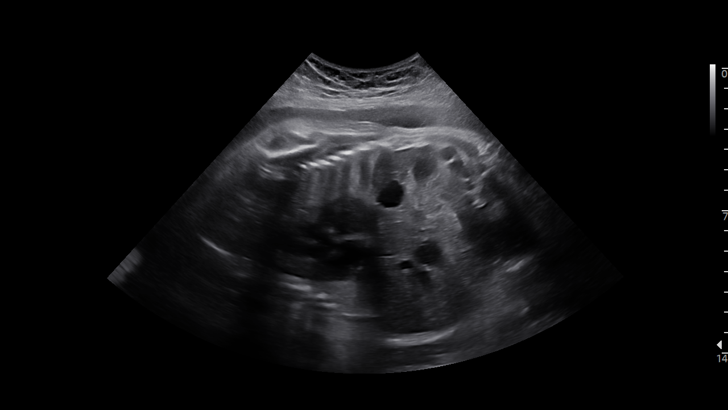
[im 48/48]
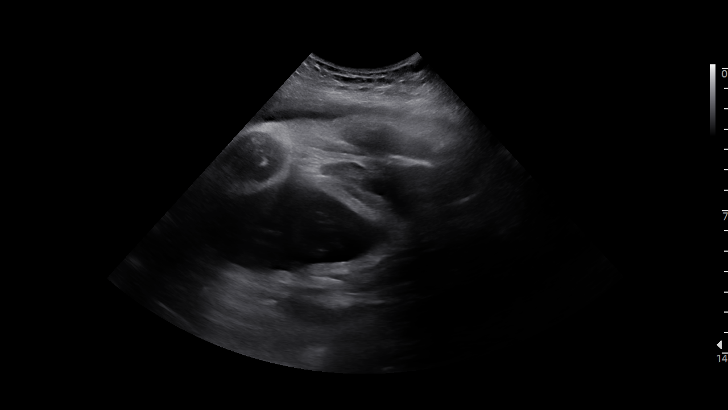

[15 of 28 positions shown; findings below may reference images not displayed]

Indications

 Poor obstetric history: Previous IUFD          [PA]
 ([DATE],and 8 months)
 34 weeks gestation of pregnancy
 Encounter for other antenatal screening        [PA]
 follow-up
Fetal Evaluation

 Num Of Fetuses:         1
 Fetal Heart Rate(bpm):  145
 Cardiac Activity:       Observed
 Presentation:           Frank breech
 Placenta:               Posterior
 P. Cord Insertion:      Previously Visualized

 Amniotic Fluid
 AFI FV:      Within normal limits

 AFI Sum(cm)     %Tile       Largest Pocket(cm)
 9.9             18

 RUQ(cm)                     LUQ(cm)        LLQ(cm)
 4.1                         2
Biophysical Evaluation
 Amniotic F.V:   Pocket => 2 cm             F. Tone:        Observed
 F. Movement:    Observed                   Score:          [DATE]
 F. Breathing:   Observed
Biometry

 BPD:      86.9  mm     G. Age:  35w 1d         75  %    CI:        75.35   %    70 - 86
                                                         FL/HC:      21.5   %    19.4 -
 HC:      317.5  mm     G. Age:  35w 5d         54  %    HC/AC:      0.98        0.96 -
 AC:       323   mm     G. Age:  36w 2d         95  %    FL/BPD:     78.6   %    71 - 87
 FL:       68.3  mm     G. Age:  35w 1d         66  %    FL/AC:      21.1   %    20 - 24

 Est. FW:    [PA]  gm      6 lb 1 oz     87  %
OB History

 Gravidity:    6         Term:   2         SAB:   3
 Living:       2
Gestational Age

 LMP:           34w 1d        Date:  [DATE]                 EDD:   [DATE]
 U/S Today:     35w 4d                                        EDD:   [DATE]
 Best:          34w 1d     Det. By:  LMP  ([DATE])          EDD:   [DATE]
Anatomy

 Cranium:               Previously seen        LVOT:                   Previously seen
 Cavum:                 Previously seen        Aortic Arch:            Previously seen
 Ventricles:            Appears normal         Ductal Arch:            Not well visualized
 Choroid Plexus:        Previously seen        Diaphragm:              Appears normal
 Cerebellum:            Previously seen        Stomach:                Appears normal, left
                                                                       sided
 Posterior Fossa:       Previously seen        Abdomen:                Appears normal
 Nuchal Fold:           Not applicable (>20    Abdominal Wall:         Previously seen
                        wks GA)
 Face:                  Orbits and profile     Cord Vessels:           Previously seen
                        previously seen
 Lips:                  Previously seen        Kidneys:                Appear normal
 Palate:                Previously seen        Bladder:                Appears normal
 Thoracic:              Appears normal         Spine:                  Previously seen
 Heart:                 Appears normal         Upper Extremities:      Previously seen
                        (4CH, axis, and
                        situs)
 RVOT:                  Previously seen        Lower Extremities:      Previously seen

 Other:  3VV/T, Heels, 5th digit, Nasal bone, and Open hands previously
         visualized. Fetus appears to be female.
Cervix Uterus Adnexa

 Cervix
 Not visualized (advanced GA >[PA])
Impression

 Fetal growth is appropriate for gestational age .Amniotic fluid
 is normal and good fetal activity is seen. Incidentally
 observed antenatal testing is reassuring. BPP [DATE]. Breech
 presentation.

 I reassured the patient with help of an interpreter ([facility]
 services).
Recommendations

 -No follow-up appointments were made.
 -Confirm presentation before delivery.
                 TAD[facility]

## 2020-09-22 ENCOUNTER — Other Ambulatory Visit: Payer: Self-pay

## 2020-09-22 ENCOUNTER — Ambulatory Visit (INDEPENDENT_AMBULATORY_CARE_PROVIDER_SITE_OTHER): Payer: Medicaid Other | Admitting: General Practice

## 2020-09-22 VITALS — BP 118/71 | HR 90 | Wt 178.0 lb

## 2020-09-22 DIAGNOSIS — O0990 Supervision of high risk pregnancy, unspecified, unspecified trimester: Secondary | ICD-10-CM

## 2020-09-22 DIAGNOSIS — L299 Pruritus, unspecified: Secondary | ICD-10-CM

## 2020-09-22 MED ORDER — HYDROXYZINE PAMOATE 25 MG PO CAPS: 25.0000 mg | ORAL_CAPSULE | Freq: Four times a day (QID) | ORAL | 0 refills | Status: DC | PRN

## 2020-09-22 NOTE — Progress Notes (Signed)
Patient counseled on new medication for itching (hydroxyzine). She has been complaining of dry mouth, dry skin, and itching. Discussed with patient that these are common side effects of diphenhydramine and she should stop taking this medication to see if these symptoms improve. Instructed patient on route, frequency, dose, side effects etc of hydroxyzine and to continue to use the Eucerin lotion for dry/itchy skin. Also counseled patient to adequately hydrate as that will help itching from dryness as well. Patient amenable to trying new medication and will call with any concerns.

## 2020-09-22 NOTE — Progress Notes (Signed)
Patient seen and assessed by nursing staff.  Agree with documentation and plan.  

## 2020-09-22 NOTE — Progress Notes (Signed)
Patient presents to office today reporting leg pain, back pain & abdominal pain. Reports severe pain for past several weeks but pain has become much worse. Patient states the pain comes and goes with pressure at times. She reports some of these feel like contractions but mostly pain from baby moving. Reports her biggest concern is itching/burning all over body x 1 week. She states the itching is so severe she has scratches all over and hasn't slept in days. Patient also complains of swelling, dry mouth/lips and has questions about treatment of stretch marks. Feet/legs +1 edema noted. Discussed with Dr Shawnie Pons who orders bile acids & CMP. Patient can also have vistaril for itching as needed. Discussed with patient & informed her of new Rx and to stop benadryl. Patient asked about medication for sleep. Advised patient the new medication should help with her itching and therefore she should be able to sleep. Reviewed with patient appt on Monday and all concerns can be readdressed then if need be as well as labs. [place] interpreter [phone] used for today's encounter.  Chase Caller RN BSN 09/22/20

## 2020-09-23 ENCOUNTER — Encounter: Payer: Self-pay | Admitting: Obstetrics & Gynecology

## 2020-09-23 DIAGNOSIS — O26619 Liver and biliary tract disorders in pregnancy, unspecified trimester: Secondary | ICD-10-CM | POA: Insufficient documentation

## 2020-09-23 DIAGNOSIS — K831 Obstruction of bile duct: Secondary | ICD-10-CM | POA: Insufficient documentation

## 2020-09-23 DIAGNOSIS — O26649 Intrahepatic cholestasis of pregnancy, unspecified trimester: Secondary | ICD-10-CM | POA: Insufficient documentation

## 2020-09-23 LAB — COMPREHENSIVE METABOLIC PANEL
ALT: 123 IU/L — ABNORMAL HIGH (ref 0–32)
AST: 61 IU/L — ABNORMAL HIGH (ref 0–40)
Albumin/Globulin Ratio: 1 — ABNORMAL LOW (ref 1.2–2.2)
Albumin: 3 g/dL — ABNORMAL LOW (ref 3.9–5.0)
Alkaline Phosphatase: 338 IU/L — ABNORMAL HIGH (ref 44–121)
BUN/Creatinine Ratio: 15 (ref 9–23)
BUN: 8 mg/dL (ref 6–20)
Bilirubin Total: 0.4 mg/dL (ref 0.0–1.2)
CO2: 17 mmol/L — ABNORMAL LOW (ref 20–29)
Calcium: 9 mg/dL (ref 8.7–10.2)
Chloride: 106 mmol/L (ref 96–106)
Creatinine, Ser: 0.53 mg/dL — ABNORMAL LOW (ref 0.57–1.00)
Globulin, Total: 2.9 g/dL (ref 1.5–4.5)
Glucose: 99 mg/dL (ref 65–99)
Potassium: 4.4 mmol/L (ref 3.5–5.2)
Sodium: 139 mmol/L (ref 134–144)
Total Protein: 5.9 g/dL — ABNORMAL LOW (ref 6.0–8.5)
eGFR: 132 mL/min/{1.73_m2} (ref >59)

## 2020-09-23 LAB — BILE ACIDS, TOTAL: Bile Acids Total: 28.6 umol/L (ref 0.0–10.0)

## 2020-09-24 ENCOUNTER — Other Ambulatory Visit: Payer: Self-pay

## 2020-09-24 ENCOUNTER — Encounter (HOSPITAL_COMMUNITY): Payer: Self-pay | Admitting: Obstetrics & Gynecology

## 2020-09-24 ENCOUNTER — Other Ambulatory Visit: Payer: Self-pay | Admitting: Obstetrics and Gynecology

## 2020-09-24 ENCOUNTER — Inpatient Hospital Stay (HOSPITAL_BASED_OUTPATIENT_CLINIC_OR_DEPARTMENT_OTHER): Payer: Medicaid Other

## 2020-09-24 ENCOUNTER — Inpatient Hospital Stay (HOSPITAL_COMMUNITY)
Admission: AD | Admit: 2020-09-24 | Discharge: 2020-09-24 | Disposition: A | Discharge status: Home / Self Care | Payer: Medicaid Other | Attending: Obstetrics & Gynecology | Admitting: Obstetrics & Gynecology

## 2020-09-24 DIAGNOSIS — K831 Obstruction of bile duct: Secondary | ICD-10-CM | POA: Diagnosis not present

## 2020-09-24 DIAGNOSIS — Z20822 Contact with and (suspected) exposure to covid-19: Secondary | ICD-10-CM | POA: Insufficient documentation

## 2020-09-24 DIAGNOSIS — Z789 Other specified health status: Secondary | ICD-10-CM

## 2020-09-24 DIAGNOSIS — Z3A36 36 weeks gestation of pregnancy: Secondary | ICD-10-CM | POA: Diagnosis not present

## 2020-09-24 DIAGNOSIS — O26649 Intrahepatic cholestasis of pregnancy, unspecified trimester: Secondary | ICD-10-CM

## 2020-09-24 DIAGNOSIS — O26613 Liver and biliary tract disorders in pregnancy, third trimester: Secondary | ICD-10-CM | POA: Insufficient documentation

## 2020-09-24 DIAGNOSIS — O26619 Liver and biliary tract disorders in pregnancy, unspecified trimester: Secondary | ICD-10-CM

## 2020-09-24 DIAGNOSIS — Z603 Acculturation difficulty: Secondary | ICD-10-CM

## 2020-09-24 DIAGNOSIS — O26643 Intrahepatic cholestasis of pregnancy, third trimester: Secondary | ICD-10-CM

## 2020-09-24 LAB — CBC WITH DIFFERENTIAL/PLATELET
Abs Immature Granulocytes: 0.07 10*3/uL (ref 0.00–0.07)
Basophils Absolute: 0 10*3/uL (ref 0.0–0.1)
Basophils Relative: 0 %
Eosinophils Absolute: 0.1 10*3/uL (ref 0.0–0.5)
Eosinophils Relative: 1 %
HCT: 36 % (ref 36.0–46.0)
Hemoglobin: 11.4 g/dL — ABNORMAL LOW (ref 12.0–15.0)
Immature Granulocytes: 1 %
Lymphocytes Relative: 22 %
Lymphs Abs: 1.4 10*3/uL (ref 0.7–4.0)
MCH: 27.1 pg (ref 26.0–34.0)
MCHC: 31.7 g/dL (ref 30.0–36.0)
MCV: 85.5 fL (ref 80.0–100.0)
Monocytes Absolute: 0.4 10*3/uL (ref 0.1–1.0)
Monocytes Relative: 7 %
Neutro Abs: 4.5 10*3/uL (ref 1.7–7.7)
Neutrophils Relative %: 69 %
Platelets: 152 10*3/uL (ref 150–400)
RBC: 4.21 MIL/uL (ref 3.87–5.11)
RDW: 14.3 % (ref 11.5–15.5)
WBC: 6.4 10*3/uL (ref 4.0–10.5)
nRBC: 0 % (ref 0.0–0.2)

## 2020-09-24 LAB — COMPREHENSIVE METABOLIC PANEL
ALT: 81 U/L — ABNORMAL HIGH (ref 0–44)
AST: 40 U/L (ref 15–41)
Albumin: 2 g/dL — ABNORMAL LOW (ref 3.5–5.0)
Alkaline Phosphatase: 270 U/L — ABNORMAL HIGH (ref 38–126)
Anion gap: 11 (ref 5–15)
BUN: 7 mg/dL (ref 6–20)
CO2: 20 mmol/L — ABNORMAL LOW (ref 22–32)
Calcium: 8.6 mg/dL — ABNORMAL LOW (ref 8.9–10.3)
Chloride: 105 mmol/L (ref 98–111)
Creatinine, Ser: 0.49 mg/dL (ref 0.44–1.00)
GFR, Estimated: >60 mL/min (ref >60)
Glucose, Bld: 99 mg/dL (ref 70–99)
Potassium: 4 mmol/L (ref 3.5–5.1)
Sodium: 136 mmol/L (ref 135–145)
Total Bilirubin: 0.3 mg/dL (ref 0.3–1.2)
Total Protein: 5.6 g/dL — ABNORMAL LOW (ref 6.5–8.1)

## 2020-09-24 LAB — RESP PANEL BY RT-PCR (FLU A&B, COVID) ARPGX2
Influenza A by PCR: NEGATIVE
Influenza B by PCR: NEGATIVE
SARS Coronavirus 2 by RT PCR: NEGATIVE

## 2020-09-24 LAB — TYPE AND SCREEN
ABO/RH(D): B POS
Antibody Screen: POSITIVE

## 2020-09-24 IMAGING — US US MFM FETAL BPP W/O NON-STRESS
1 series · 15 of 28 positions shown · non-contrast
Comparison: none

[Series 1: us mfm fetal bpp w/o non-stress · 32 acquisitions, 15 frames shown]
[im 1/32]
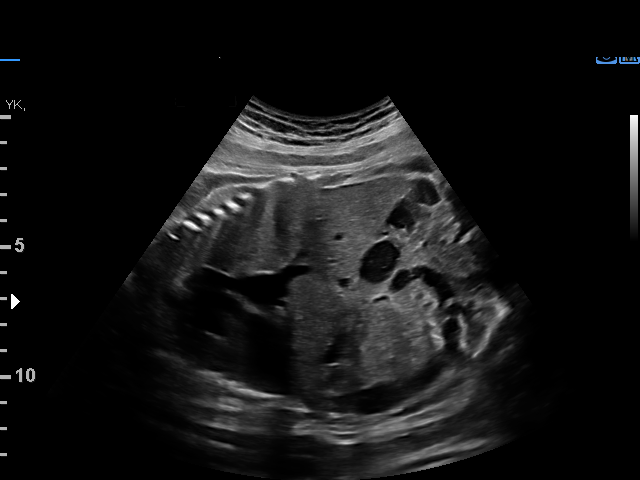
[im 3/32]
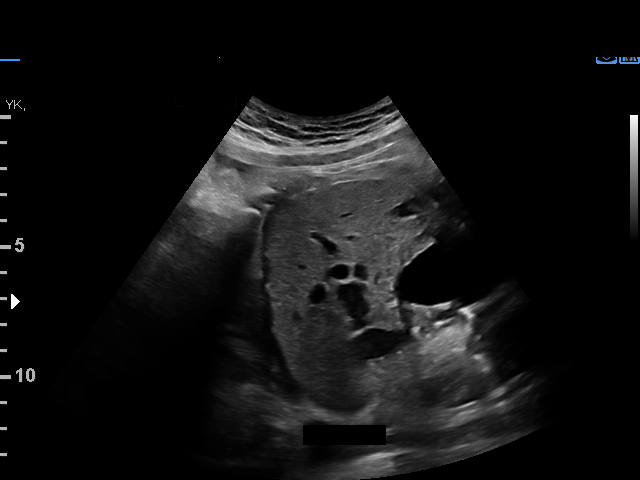
[im 5/32]
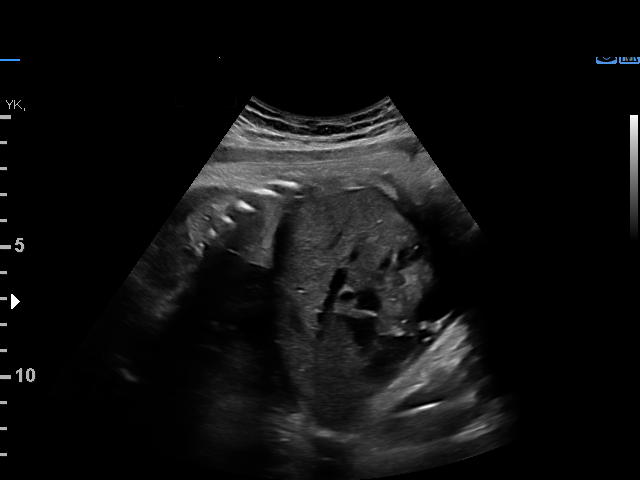
[im 7/32]
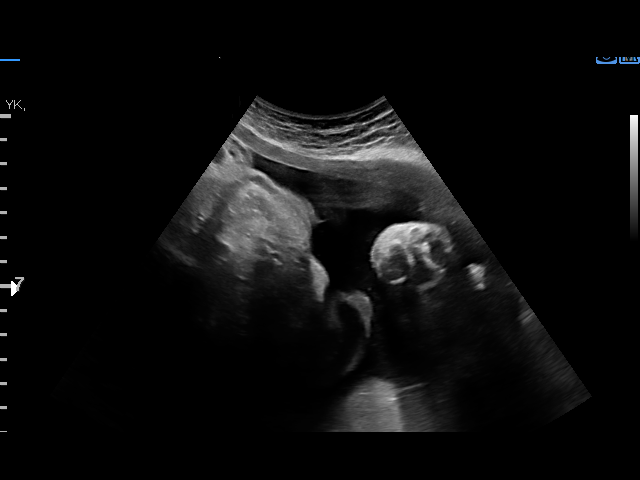
[im 10/32]
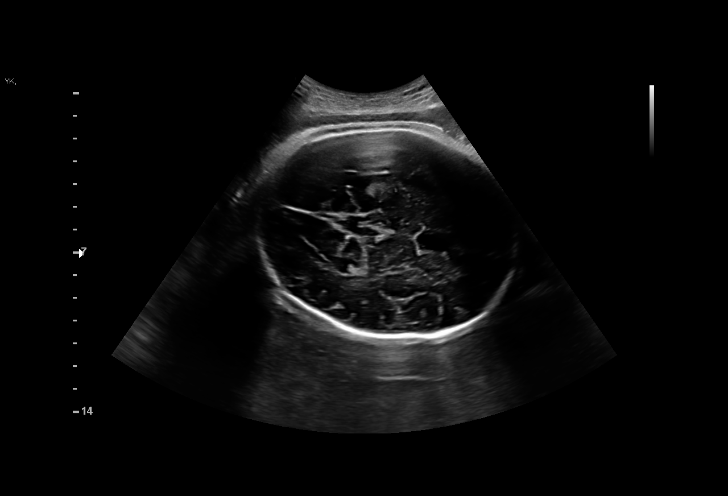
[im 12/32]
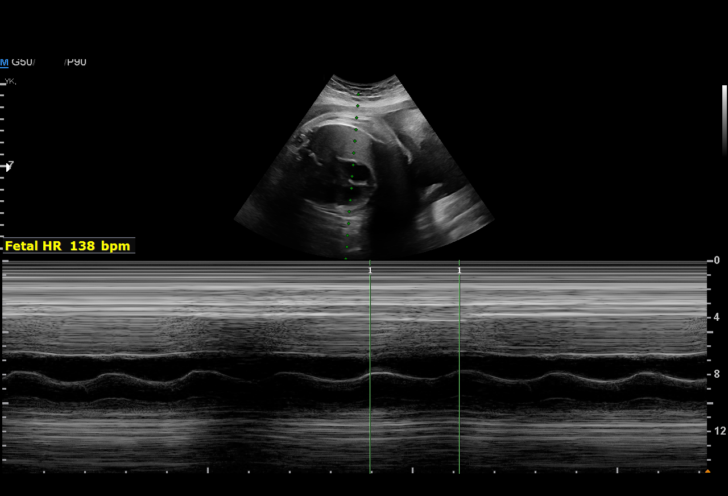
[im 14/32]
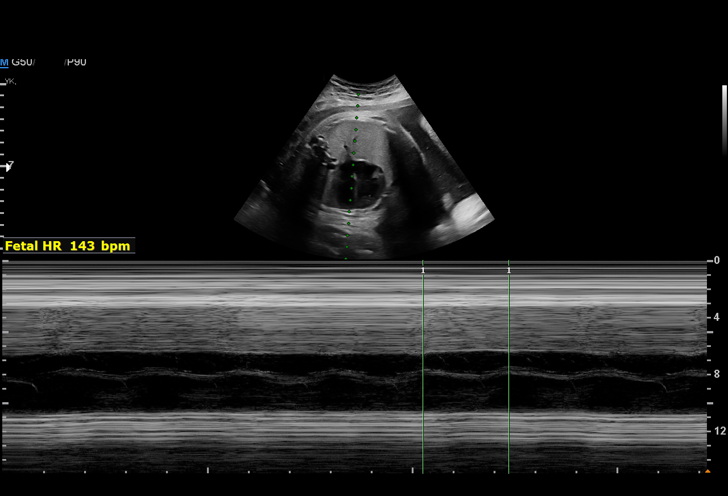
[im 17/32]
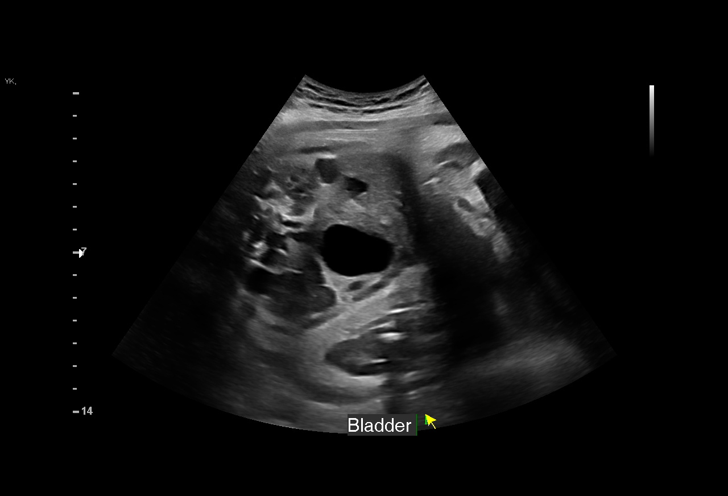
[im 18/32]
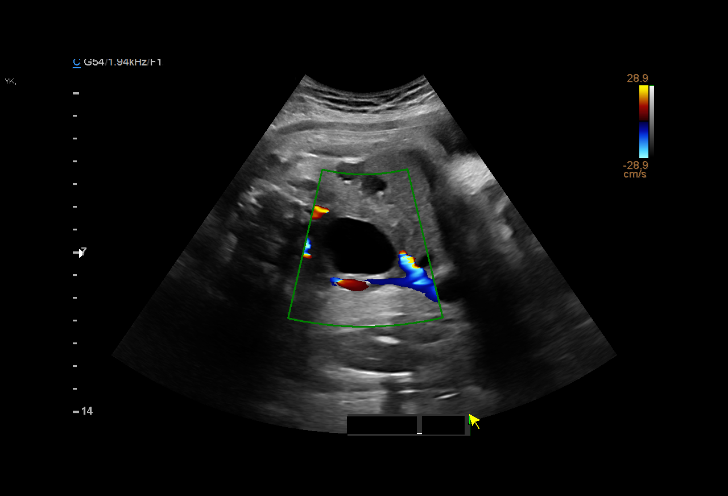
[im 20/32]
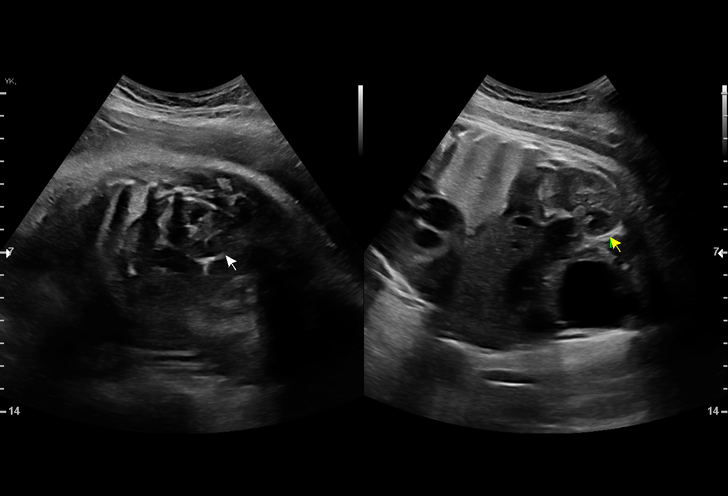
[im 22/32]
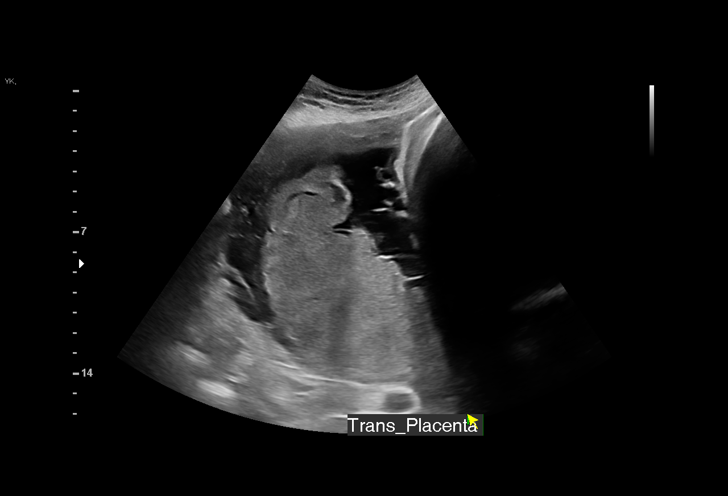
[im 25/32]
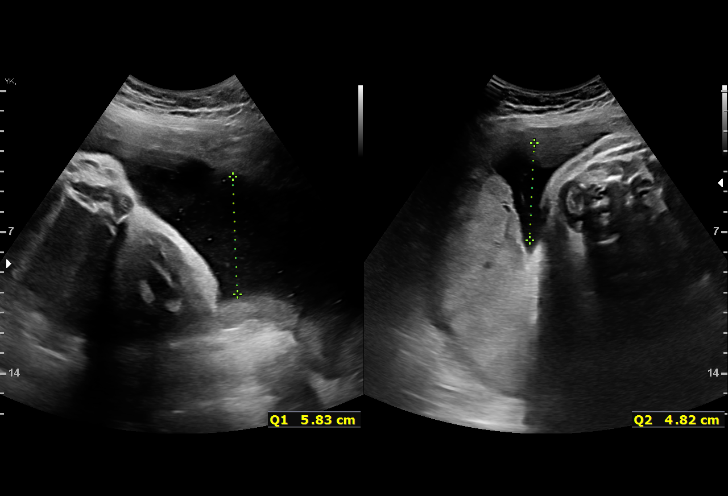
[im 27/32]
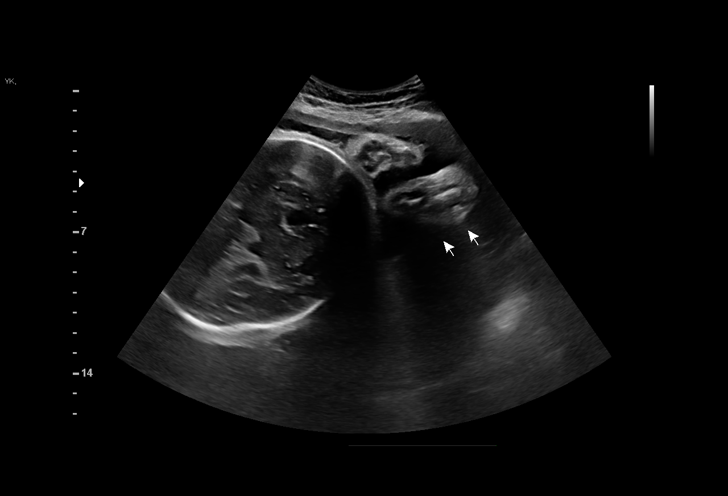
[im 29/32]
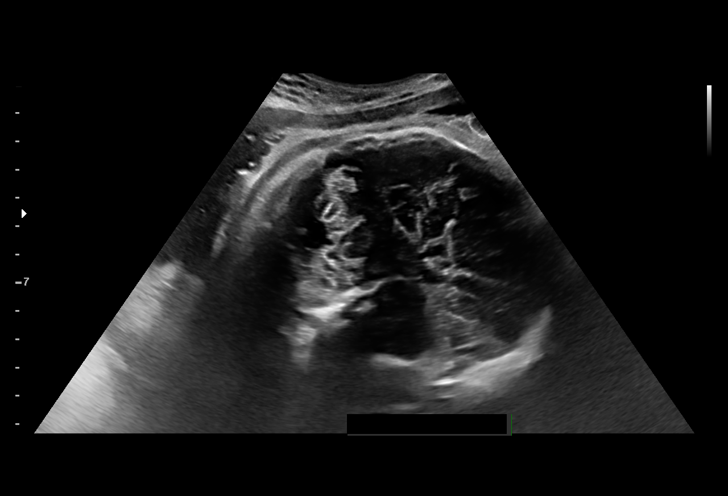
[im 32/32]
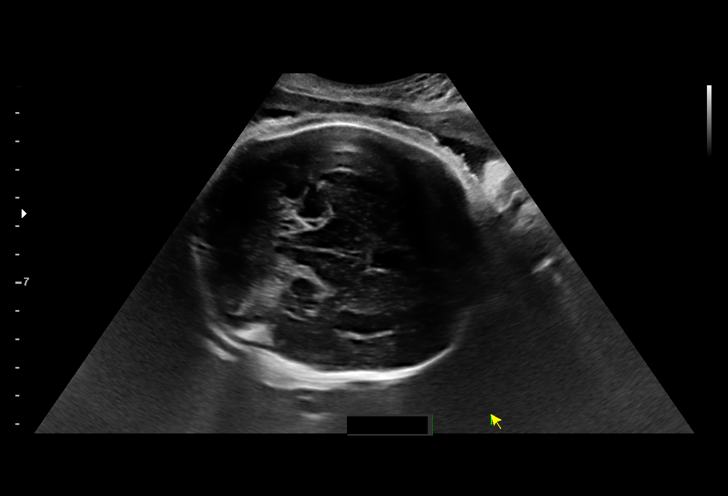

[15 of 28 positions shown; findings below may reference images not displayed]

Attending:        MEGURO      Secondary Phy.:    MEGURO
                                                             MEGURO CNM
                   [RK]

Indications

 Cholelithiasis affecting pregnancy,             O26.619, [RK]
 antepartum
 Poor obstetric history: Previous IUFD           [RK]
 ([DATE],and 8 months)
 Encounter for other antenatal screening         [RK]
 follow-up
 36 weeks gestation of pregnancy
Fetal Evaluation

 Num Of Fetuses:          1
 Fetal Heart Rate(bpm):   138
 Cardiac Activity:        Observed
 Presentation:            Frank breech
 Placenta:                Posterior
 P. Cord Insertion:       Previously Visualized

 Amniotic Fluid
 AFI FV:      Within normal limits

 AFI Sum(cm)     %Tile       Largest Pocket(cm)
 18              67

 RUQ(cm)       RLQ(cm)       LUQ(cm)        LLQ(cm)

Biophysical Evaluation

 Amniotic F.V:   Within normal limits       F. Tone:         Observed
 F. Movement:    Observed                   Score:           [DATE]
 F. Breathing:   Observed
OB History

 Gravidity:    6         Term:   2         SAB:   3
 Living:       2
Gestational Age

 LMP:           36w 2d        Date:  [DATE]                 EDD:   [DATE]
 Best:          36w 2d     Det. By:  LMP  ([DATE])          EDD:   [DATE]
Anatomy

 Cranium:               Appears normal         Stomach:                Appears normal, left
                                                                       sided
 Cavum:                 Appears normal         Cord Vessels:           Appears normal (3
                                                                       vessel cord)
 Ventricles:            Appears normal         Kidneys:                Appear normal
 Cerebellum:            Appears normal         Bladder:                Appears normal
 Posterior Fossa:       Appears normal
Impression

 Antenatal testing performed given maternal cholethiasis.
 The biophysical profile was [DATE] with good fetal movement and
 amniotic fluid volume.
Recommendations

 Clinical correlation recommended.

## 2020-09-24 MED ORDER — BETAMETHASONE SOD PHOS & ACET 6 (3-3) MG/ML IJ SUSP
12.0000 mg | Freq: Once | INTRAMUSCULAR | Status: AC
  Administered 2020-09-24: 12 mg via INTRAMUSCULAR
  Filled 2020-09-24: qty 5

## 2020-09-24 MED ORDER — URSODIOL 500 MG PO TABS: 500.0000 mg | ORAL_TABLET | Freq: Two times a day (BID) | ORAL | 0 refills | Status: DC

## 2020-09-24 NOTE — Discharge Instructions (Signed)
Please return to MAU on 09/25/2020 at 1:00PM to receive 2nd steroid shot. This is important for you to get 2nd shot to help mature the baby's lungs. Your induction of labor will be Monday 09/26/2020. You will need to wait for a nurse from labor and delivery to call you before you come to the hospital.

## 2020-09-24 NOTE — Progress Notes (Signed)
Patient was called with the help of a [name] Kennyth Lose interpreter [phone]) to discuss results showing that she has cholestasis. Called her home phone which went to voicemail.  Called her mobile number and it was not set up for voicemail.  Of note, tried to call her yesterday evening but her husband said they were unable to talk at that time and wanted me to call back later.   Left voicemail on home phone with instructions for patient to call MAU given abnormal results.  She needs to come in to MAU to get betamethasone today and tomorrow.  Also needs BPP today when she arrives.  She can be prescribed Ursodiol 500 mg po bid and scheduled for induction of labor on  09/26/20 or 09/27/20.  May need pre-induction COVID testing also.   Thank you for helping [place] with informing this patient of the plan when she calls back.    Jaynie Collins, MD, FACOG Obstetrician & Gynecologist, [facility] for [employer], [facility]

## 2020-09-24 NOTE — MAU Note (Signed)
Pt reports to mau for BPP and BMZ after being called in by provider.  Pt verbalizing that she has not received the results for tests. Denies ctx today. +FM

## 2020-09-24 NOTE — MAU Provider Note (Signed)
History     CSN: [id number]  Arrival date and time: 09/24/20 1234   Event Date/Time   First Provider Initiated Contact with Patient 09/24/20 1330      Chief Complaint  Patient presents with  . Pregnancy Ultrasound  . Injections   [name] is a 25 y.o. year old [phone] female at [redacted]w[redacted]d weeks gestation who presents to MAU for BMZ injection and BPP for cholestasis of pregnancy. Per Dr. Macon Large, the patient should have BMZ today, repeat BMZ tomorrow and BPP today.  Note from Dr. Macon Large: Patient was called with the help of a [name] Kennyth Lose interpreter [phone]) to discuss results showing that she has cholestasis. Called her home phone which went to voicemail. Called her mobile number and it was not set up for voicemail. Of note, tried to call her yesterday evening but her husband said they were unable to talk at that time and wanted me to call back later.  Left voicemail on home phone with instructions for patient to call MAU given abnormal results. She needs to come in to MAU to get betamethasone today and tomorrow. Also needs BPP today when she arrives. She can be prescribed Ursodiol 500 mg po bid and scheduled for induction of labor on 09/26/20 or 09/27/20. May need pre-induction COVID testing also.   OB History    Gravida  6   Para  2   Term  2   Preterm      AB  3   Living  2     SAB  3   IAB      Ectopic      Multiple      Live Births  2           Past Medical History:  Diagnosis Date  . Medical history non-contributory     Past Surgical History:  Procedure Laterality Date  . arm surgery      History reviewed. No pertinent family history.  Social History   Tobacco Use  . Smoking status: Never Smoker  . Smokeless tobacco: Never Used  Vaping Use  . Vaping Use: Never used  Substance Use Topics  . Alcohol use: Never  . Drug use: Never    Allergies: No Known Allergies  Medications Prior to Admission  Medication Sig  Dispense Refill Last Dose  . acetaminophen (TYLENOL 8 HOUR) 650 MG CR tablet Take 1 tablet (650 mg total) by mouth every 6 (six) hours as needed for pain. 30 tablet 0   . diphenhydrAMINE (BENADRYL ALLERGY ULTRATABS) 25 MG tablet Take 1 tablet (25 mg total) by mouth at bedtime as needed. 30 tablet 0   . Emollient (EUCERIN) lotion Apply topically as needed for dry skin. 240 mL 0   . hydrOXYzine (VISTARIL) 25 MG capsule Take 1 capsule (25 mg total) by mouth every 6 (six) hours as needed. (Patient not taking: Reported on 09/22/2020) 30 capsule 0   . pantoprazole (PROTONIX) 40 MG tablet Take 1 tablet (40 mg total) by mouth daily. 30 tablet 3   . Prenatal Vit-Fe Fumarate-FA (PRENATAL VITAMINS) 28-0.8 MG TABS Take 1 tablet by mouth daily. 90 tablet 2   . terconazole (TERAZOL 7) 0.4 % vaginal cream Place 1 applicator vaginally at bedtime. 45 g 0     Review of Systems  Constitutional: Negative.   HENT: Negative.   Eyes: Negative.   Respiratory: Negative.   Cardiovascular: Negative.   Gastrointestinal: Negative.   Endocrine: Negative.   Genitourinary: Negative.        (+)  FM  Musculoskeletal: Negative.   Skin:       Itching all over, varicosities all over legs "makes legs hurt and cannot walk"  Allergic/Immunologic: Negative.   Neurological: Negative.   Hematological: Negative.   Psychiatric/Behavioral: Negative.    Physical Exam   Blood pressure 112/65, pulse 92, temperature 98.4 F (36.9 C), resp. rate 15, SpO2 99 %.  Physical Exam Vitals and nursing note reviewed. Exam conducted with a chaperone present.  Constitutional:      Appearance: Normal appearance. She is obese.  Abdominal:     Palpations: Abdomen is soft.  Genitourinary:    Comments: Dilation: 1 Effacement (%): Thick Cervical Position: Posterior Presentation: Vertex Exam by: Carloyn Jaeger, CNM   Musculoskeletal:        General: Normal range of motion.  Skin:    Comments: Mottled skin with multiple varicosities on BLE (not  a new finding)  Neurological:     Mental Status: She is alert and oriented to person, place, and time.  Psychiatric:        Mood and Affect: Mood normal.        Behavior: Behavior normal.        Thought Content: Thought content normal.        Judgment: Judgment normal.    REACTIVE NST - FHR: 135 bpm / moderate variability / accels present / decels absent / TOCO: none MAU Course  Procedures  MDM NST BMZ #1 IM injection BPP     *Consult with Dr. Charlotta Newton @ [phone] - notified of patient's complaints, assessments, lab & U/S results, IOL scheduled on 09/26/2020 - advised by Dr. Charlotta Newton to reschedule IOL date for 09/29/2020 -- IOL appt changed to 09/29/2020 at midnight    Assessment and Plan  Intrahepatic cholestasis of pregnancy, antepartum  - Reassurance given of normal NST & BPP today - Return to MAU on 09/25/2020 for 2nd BMZ injection - COVID test and pre-admission labs pending - NST Monday 09/26/2020 - IOL scheduled for 09/29/2020 at midnight - Patient advised someone from L&D will call her  [redacted] weeks gestation of pregnancy  Language barrier affecting health care - [employer] [place], #[phone] used   - Discharge patient - Patient to return to MAU tomorrow for BMZ #2 injection - Patient verbalized an understanding of the plan of care and agrees.   Raelyn Mora, CNM 09/24/2020, 1:59 PM

## 2020-09-25 ENCOUNTER — Other Ambulatory Visit: Payer: Self-pay

## 2020-09-25 ENCOUNTER — Encounter (HOSPITAL_COMMUNITY): Payer: Self-pay | Admitting: Obstetrics & Gynecology

## 2020-09-25 ENCOUNTER — Inpatient Hospital Stay (HOSPITAL_COMMUNITY)
Admission: AD | Admit: 2020-09-25 | Discharge: 2020-09-25 | Disposition: A | Discharge status: Home / Self Care | Payer: Medicaid Other | Attending: Obstetrics & Gynecology | Admitting: Obstetrics & Gynecology

## 2020-09-25 DIAGNOSIS — Z362 Encounter for other antenatal screening follow-up: Secondary | ICD-10-CM

## 2020-09-25 DIAGNOSIS — O26613 Liver and biliary tract disorders in pregnancy, third trimester: Secondary | ICD-10-CM

## 2020-09-25 DIAGNOSIS — O09293 Supervision of pregnancy with other poor reproductive or obstetric history, third trimester: Secondary | ICD-10-CM

## 2020-09-25 DIAGNOSIS — Z3A36 36 weeks gestation of pregnancy: Secondary | ICD-10-CM | POA: Diagnosis not present

## 2020-09-25 DIAGNOSIS — O321XX0 Maternal care for breech presentation, not applicable or unspecified: Secondary | ICD-10-CM

## 2020-09-25 DIAGNOSIS — K831 Obstruction of bile duct: Secondary | ICD-10-CM | POA: Diagnosis not present

## 2020-09-25 LAB — RPR: RPR Ser Ql: NONREACTIVE

## 2020-09-25 MED ORDER — BETAMETHASONE SOD PHOS & ACET 6 (3-3) MG/ML IJ SUSP
12.0000 mg | Freq: Once | INTRAMUSCULAR | Status: AC
  Administered 2020-09-25: 12 mg via INTRAMUSCULAR

## 2020-09-25 NOTE — MAU Provider Note (Addendum)
Event Date/Time   First Provider Initiated Contact with Patient 09/25/20 1456      S [name] is a 25 y.o. [id number] patient who presents to MAU today for a second steroid injection. She denies any VB or LOF. She reports normal fetal movement. She had some mucous discharge after being checked here yesterday. She also reports some back pain.   O BP (!) 115/47 (BP Location: Right Arm)   Pulse 88   Temp 98 F (36.7 C) (Oral)   Resp 16   SpO2 99%  Physical Exam Vitals and nursing note reviewed. Exam conducted with a chaperone present.  Constitutional:      General: She is not in acute distress. HENT:     Head: Normocephalic.  Cardiovascular:     Rate and Rhythm: Normal rate.  Pulmonary:     Effort: Pulmonary effort is normal.  Abdominal:     Palpations: Abdomen is soft.     Tenderness: There is no abdominal tenderness.  Neurological:     Mental Status: She is alert and oriented to person, place, and time.   FHT 135 with doppler   Very lengthy discussion with patient regarding current diagnosis of ICP, why we are giving BMZ and why we want the baby born by 37 weeks. She states that she does not understand and that I am the first person telling her this. Very clearly explained ICP to the patient with the interpretor and she verbalized understanding of the condition. However, she does not wish to have the baby early. She states that she would like to go into labor on her own and wait until closer to her due date. Advised the patient that ICP greatly increases the risk of stillbirth. Patient verbalizes understanding of this. She says she will get the BMZ today, but she cannot agree to a c-section or version until she has spoken with her husband. However, she states "god willing" I have will have this baby by the due date or in 10 days, but not sooner.   Of note, baby is breech on [place] from 09/24/2020. Patient has been offered version or c-section. She declines all at this time.    A Medical screening exam complete 1. Cholestasis of pregnancy in third trimester   2. [redacted] weeks gestation of pregnancy      P Discharge from MAU in stable condition Patient given instructions to keep appt for tomorrow at [facility]  Will route message to clinic so they are aware of patient's desires, and will see if we can get an in person interpretor for her visit.  Warning signs for worsening condition that would warrant emergency follow-up discussed Patient may return to MAU as needed   Thressa Sheller DNP, CNM  09/25/20  2:57 PM  Int# [id number] used for entire visit

## 2020-09-25 NOTE — MAU Note (Signed)
Pt here for 2nd dose of BMZ. FHR 135.  Reports irreg ctx Denies LOF or bleeding

## 2020-09-26 ENCOUNTER — Inpatient Hospital Stay (HOSPITAL_COMMUNITY)
Admission: AD | Admit: 2020-09-26 | Payer: Medicaid Other | Source: Home / Self Care | Admitting: Obstetrics & Gynecology

## 2020-09-26 ENCOUNTER — Inpatient Hospital Stay (HOSPITAL_COMMUNITY): Payer: Medicaid Other

## 2020-09-26 ENCOUNTER — Telehealth (HOSPITAL_COMMUNITY): Payer: Self-pay | Admitting: *Deleted

## 2020-09-26 ENCOUNTER — Ambulatory Visit (INDEPENDENT_AMBULATORY_CARE_PROVIDER_SITE_OTHER): Payer: Medicaid Other | Admitting: Student

## 2020-09-26 ENCOUNTER — Telehealth: Payer: Self-pay | Admitting: *Deleted

## 2020-09-26 ENCOUNTER — Other Ambulatory Visit (HOSPITAL_COMMUNITY)
Admission: RE | Admit: 2020-09-26 | Discharge: 2020-09-26 | Disposition: A | Discharge status: Home / Self Care | Payer: Medicaid Other | Source: Ambulatory Visit | Attending: Student | Admitting: Student

## 2020-09-26 DIAGNOSIS — O0990 Supervision of high risk pregnancy, unspecified, unspecified trimester: Secondary | ICD-10-CM

## 2020-09-26 DIAGNOSIS — K831 Obstruction of bile duct: Secondary | ICD-10-CM

## 2020-09-26 DIAGNOSIS — O26619 Liver and biliary tract disorders in pregnancy, unspecified trimester: Secondary | ICD-10-CM

## 2020-09-26 DIAGNOSIS — J069 Acute upper respiratory infection, unspecified: Secondary | ICD-10-CM

## 2020-09-26 LAB — CULTURE, BETA STREP (GROUP B ONLY)

## 2020-09-26 NOTE — Progress Notes (Signed)
   PRENATAL VISIT NOTE  Subjective:  Diana Hopkins is a 25 y.o. [id number] at [redacted]w[redacted]d being seen today for ongoing prenatal care.  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  Patient reports no complaints. She is newly diagnosed with cholstatis of pregnancy. She dneies LOF, vaginal bleeding, she endorses fetal movements and no LOF.  Contractions: Irritability. Vag. Bleeding: None.  Movement: Present. Denies leaking of fluid.   The following portions of the patient's history were reviewed and updated as appropriate: allergies, current medications, past family history, past medical history, past social history, past surgical history and problem list.   Objective:   Vitals:   09/26/20 0837  BP: 98/69  Pulse: 81  Weight: 178 lb 14.4 oz (81.1 kg)    Fetal Status:     Movement: Present     General:  Alert, oriented and cooperative. Patient is in no acute distress.  Skin: Skin is warm and dry. No rash noted.   Cardiovascular: Normal heart rate noted  Respiratory: Normal respiratory effort, no problems with respiration noted  Abdomen: Soft, gravid, appropriate for gestational age.  Pain/Pressure: Present     Pelvic: Cervical exam deferred        Extremities: Normal range of motion.  Edema: Trace  Mental Status: Normal mood and affect. Normal behavior. Normal judgment and thought content.   Assessment and Plan:  Pregnancy: [id number] at [redacted]w[redacted]d 1. Intrahepatic cholestasis of pregnancy, antepartum -detailed explanation of cholestasis with live interpreter,  explained to her how it develops, risk to baby. NST reactive today.  -GC CT collected today - Fetal nonstress test; Future  2. Viral URI No symptoms today  3. Supervision of high risk pregnancy, antepartum -explained need for IOL due to cholestasis; patient has many questions about IOL, breech presentation,  process for hospital, when to come to hospital and version.  Discussed patients' case and [place] results with Dr. Alvester Morin, who recommends discussion of version vs. C/section vs. Breech delivery at 37 weeks.   Risks v. Benefits discussed of version vs. C/section vs. Breech delivery (including limited providers available for this footling breech delivery)>>Patient elects for version and then IOL following. She agreed to version on Thursday morning before her IOL; version scheduled. Explained that she may have female provider in her care and that we can only request but may not be possible to have female provider. Patient knows to arrive   Preterm labor symptoms and general obstetric precautions including but not limited to vaginal bleeding, contractions, leaking of fluid and fetal movement were reviewed in detail with the patient. Please refer to After Visit Summary for other counseling recommendations.   No follow-ups on file.  Future Appointments  Date Time Provider Department Center  09/27/2020  9:45 AM [facility]-SCREENING [facility] None  09/29/2020  6:30 AM [facility]-LD SCHED ROOM [facility] None    [name], CNM

## 2020-09-26 NOTE — Telephone Encounter (Signed)
Interpreter number [phone] Preadmission screen

## 2020-09-26 NOTE — Telephone Encounter (Signed)
Received voice message from [facility] stating reference number [id number] or [id number] . I called [facility] and they confirmed they were calling re: Bile Acids high results from 09/22/20 of 28/6.   Per chart review Dr. Macon Large reveiwed results and attempted to reach patient and left message re results and plan of care for patient to go to hospital MAU. Patient went to hospital 4/16 and 09/25/20 for cholestasis.  Verdie Barrows,RN

## 2020-09-27 ENCOUNTER — Encounter (HOSPITAL_COMMUNITY): Payer: Self-pay | Admitting: *Deleted

## 2020-09-27 ENCOUNTER — Telehealth (HOSPITAL_COMMUNITY): Payer: Self-pay | Admitting: *Deleted

## 2020-09-27 ENCOUNTER — Other Ambulatory Visit (HOSPITAL_COMMUNITY)
Admission: RE | Admit: 2020-09-27 | Discharge: 2020-09-27 | Disposition: A | Discharge status: Home / Self Care | Payer: Medicaid Other | Source: Ambulatory Visit | Attending: Family Medicine | Admitting: Family Medicine

## 2020-09-27 ENCOUNTER — Inpatient Hospital Stay (HOSPITAL_COMMUNITY)
Admission: AD | Admit: 2020-09-27 | Payer: Medicaid Other | Source: Home / Self Care | Admitting: Obstetrics and Gynecology

## 2020-09-27 DIAGNOSIS — Z01812 Encounter for preprocedural laboratory examination: Secondary | ICD-10-CM | POA: Diagnosis not present

## 2020-09-27 DIAGNOSIS — Z20822 Contact with and (suspected) exposure to covid-19: Secondary | ICD-10-CM | POA: Diagnosis not present

## 2020-09-27 LAB — GC/CHLAMYDIA PROBE AMP (~~LOC~~) NOT AT ARMC
Chlamydia: NEGATIVE
Comment: NEGATIVE
Comment: NORMAL
Neisseria Gonorrhea: NEGATIVE

## 2020-09-27 LAB — SARS CORONAVIRUS 2 (TAT 6-24 HRS): SARS Coronavirus 2: NEGATIVE

## 2020-09-27 NOTE — Telephone Encounter (Signed)
Preadmission screen Interpreter number [phone]

## 2020-09-27 NOTE — Telephone Encounter (Signed)
Pt understood arrival time.  Instructed to be NPO and to take her cholestasis medication as prescribed.  Verbalized understanding with the interpreter.

## 2020-09-28 ENCOUNTER — Encounter (HOSPITAL_COMMUNITY): Payer: Self-pay

## 2020-09-28 ENCOUNTER — Inpatient Hospital Stay (HOSPITAL_COMMUNITY): Payer: Medicaid Other

## 2020-09-28 ENCOUNTER — Inpatient Hospital Stay (HOSPITAL_COMMUNITY)
Admission: AD | Admit: 2020-09-28 | Payer: Medicaid Other | Source: Home / Self Care | Admitting: Obstetrics & Gynecology

## 2020-09-29 ENCOUNTER — Inpatient Hospital Stay (HOSPITAL_COMMUNITY): Admission: RE | Admit: 2020-09-29 | Payer: Medicaid Other | Source: Ambulatory Visit

## 2020-09-29 ENCOUNTER — Inpatient Hospital Stay (HOSPITAL_COMMUNITY): Payer: Medicaid Other

## 2020-09-29 ENCOUNTER — Encounter (HOSPITAL_COMMUNITY): Payer: Self-pay | Admitting: Obstetrics and Gynecology

## 2020-09-29 ENCOUNTER — Encounter (HOSPITAL_COMMUNITY)
Admission: AD | Disposition: A | Discharge status: Home / Self Care | Payer: Self-pay | Source: Home / Self Care | Attending: Obstetrics and Gynecology

## 2020-09-29 ENCOUNTER — Inpatient Hospital Stay (HOSPITAL_COMMUNITY)
Admission: AD | Admit: 2020-09-29 | Discharge: 2020-10-02 | DRG: 805 | Disposition: A | Discharge status: Home / Self Care | Payer: Medicaid Other | Attending: Obstetrics and Gynecology | Admitting: Obstetrics and Gynecology

## 2020-09-29 ENCOUNTER — Other Ambulatory Visit: Payer: Self-pay

## 2020-09-29 DIAGNOSIS — O99893 Other specified diseases and conditions complicating puerperium: Secondary | ICD-10-CM | POA: Diagnosis not present

## 2020-09-29 DIAGNOSIS — Z3A37 37 weeks gestation of pregnancy: Secondary | ICD-10-CM | POA: Diagnosis not present

## 2020-09-29 DIAGNOSIS — R7612 Nonspecific reaction to cell mediated immunity measurement of gamma interferon antigen response without active tuberculosis: Secondary | ICD-10-CM | POA: Diagnosis present

## 2020-09-29 DIAGNOSIS — Z758 Other problems related to medical facilities and other health care: Secondary | ICD-10-CM | POA: Diagnosis present

## 2020-09-29 DIAGNOSIS — O26619 Liver and biliary tract disorders in pregnancy, unspecified trimester: Secondary | ICD-10-CM | POA: Diagnosis present

## 2020-09-29 DIAGNOSIS — O26643 Intrahepatic cholestasis of pregnancy, third trimester: Secondary | ICD-10-CM | POA: Diagnosis present

## 2020-09-29 DIAGNOSIS — Z3A29 29 weeks gestation of pregnancy: Secondary | ICD-10-CM

## 2020-09-29 DIAGNOSIS — O2662 Liver and biliary tract disorders in childbirth: Secondary | ICD-10-CM | POA: Diagnosis not present

## 2020-09-29 DIAGNOSIS — R32 Unspecified urinary incontinence: Secondary | ICD-10-CM | POA: Diagnosis not present

## 2020-09-29 DIAGNOSIS — K831 Obstruction of bile duct: Secondary | ICD-10-CM | POA: Diagnosis not present

## 2020-09-29 DIAGNOSIS — Z789 Other specified health status: Secondary | ICD-10-CM

## 2020-09-29 DIAGNOSIS — O0932 Supervision of pregnancy with insufficient antenatal care, second trimester: Secondary | ICD-10-CM

## 2020-09-29 DIAGNOSIS — O321XX0 Maternal care for breech presentation, not applicable or unspecified: Principal | ICD-10-CM | POA: Diagnosis present

## 2020-09-29 DIAGNOSIS — O26649 Intrahepatic cholestasis of pregnancy, unspecified trimester: Secondary | ICD-10-CM | POA: Diagnosis present

## 2020-09-29 DIAGNOSIS — O2340 Unspecified infection of urinary tract in pregnancy, unspecified trimester: Secondary | ICD-10-CM | POA: Diagnosis present

## 2020-09-29 DIAGNOSIS — Z3A00 Weeks of gestation of pregnancy not specified: Secondary | ICD-10-CM | POA: Diagnosis not present

## 2020-09-29 DIAGNOSIS — O0990 Supervision of high risk pregnancy, unspecified, unspecified trimester: Secondary | ICD-10-CM

## 2020-09-29 DIAGNOSIS — L299 Pruritus, unspecified: Secondary | ICD-10-CM | POA: Diagnosis not present

## 2020-09-29 DIAGNOSIS — Z603 Acculturation difficulty: Secondary | ICD-10-CM

## 2020-09-29 DIAGNOSIS — O26613 Liver and biliary tract disorders in pregnancy, third trimester: Secondary | ICD-10-CM

## 2020-09-29 LAB — CBC
HCT: 36 % (ref 36.0–46.0)
Hemoglobin: 11.1 g/dL — ABNORMAL LOW (ref 12.0–15.0)
MCH: 26.5 pg (ref 26.0–34.0)
MCHC: 30.8 g/dL (ref 30.0–36.0)
MCV: 85.9 fL (ref 80.0–100.0)
Platelets: 170 10*3/uL (ref 150–400)
RBC: 4.19 MIL/uL (ref 3.87–5.11)
RDW: 14.5 % (ref 11.5–15.5)
WBC: 7.7 10*3/uL (ref 4.0–10.5)
nRBC: 0.4 % — ABNORMAL HIGH (ref 0.0–0.2)

## 2020-09-29 SURGERY — Surgical Case
Anesthesia: Regional

## 2020-09-29 MED ORDER — ACETAMINOPHEN 325 MG PO TABS: 650.0000 mg | ORAL_TABLET | ORAL | Status: DC | PRN

## 2020-09-29 MED ORDER — OXYTOCIN-SODIUM CHLORIDE 30-0.9 UT/500ML-% IV SOLN
INTRAVENOUS | Status: AC
  Filled 2020-09-29: qty 500

## 2020-09-29 MED ORDER — LIDOCAINE HCL (PF) 1 % IJ SOLN: 30.0000 mL | INTRAMUSCULAR | Status: DC | PRN

## 2020-09-29 MED ORDER — LIDOCAINE HCL 1 % IJ SOLN
INTRAMUSCULAR | Status: AC
  Filled 2020-09-29: qty 20

## 2020-09-29 MED ORDER — FENTANYL CITRATE (PF) 100 MCG/2ML IJ SOLN: 100.0000 ug | INTRAMUSCULAR | Status: DC | PRN

## 2020-09-29 MED ORDER — ONDANSETRON HCL 4 MG/2ML IJ SOLN: 4.0000 mg | Freq: Four times a day (QID) | INTRAMUSCULAR | Status: DC | PRN

## 2020-09-29 MED ORDER — TERBUTALINE SULFATE 1 MG/ML IJ SOLN: 0.2500 mg | Freq: Once | INTRAMUSCULAR | Status: DC | PRN

## 2020-09-29 MED ORDER — TERBUTALINE SULFATE 1 MG/ML IJ SOLN
INTRAMUSCULAR | Status: AC
  Filled 2020-09-29: qty 1

## 2020-09-29 MED ORDER — SOD CITRATE-CITRIC ACID 500-334 MG/5ML PO SOLN: 30.0000 mL | ORAL | Status: DC | PRN

## 2020-09-29 MED ORDER — OXYTOCIN BOLUS FROM INFUSION
333.0000 mL | Freq: Once | INTRAVENOUS | Status: AC
  Administered 2020-09-30: 333 mL via INTRAVENOUS

## 2020-09-29 MED ORDER — LACTATED RINGERS IV SOLN: INTRAVENOUS | Status: DC

## 2020-09-29 MED ORDER — HYDROXYZINE HCL 50 MG PO TABS
50.0000 mg | ORAL_TABLET | Freq: Four times a day (QID) | ORAL | Status: DC | PRN
  Filled 2020-09-29: qty 1

## 2020-09-29 MED ORDER — OXYTOCIN BOLUS FROM INFUSION: 333.0000 mL | Freq: Once | INTRAVENOUS | Status: DC

## 2020-09-29 MED ORDER — PHENYLEPHRINE HCL-NACL 20-0.9 MG/250ML-% IV SOLN
INTRAVENOUS | Status: AC
  Filled 2020-09-29: qty 250

## 2020-09-29 MED ORDER — DEXAMETHASONE SODIUM PHOSPHATE 10 MG/ML IJ SOLN
INTRAMUSCULAR | Status: AC
  Filled 2020-09-29: qty 1

## 2020-09-29 MED ORDER — OXYTOCIN-SODIUM CHLORIDE 30-0.9 UT/500ML-% IV SOLN: 2.5000 [IU]/h | INTRAVENOUS | Status: DC

## 2020-09-29 MED ORDER — LACTATED RINGERS IV SOLN: 500.0000 mL | INTRAVENOUS | Status: DC | PRN

## 2020-09-29 MED ORDER — LIDOCAINE HCL (PF) 1 % IJ SOLN
30.0000 mL | INTRAMUSCULAR | Status: DC | PRN
  Filled 2020-09-29: qty 30

## 2020-09-29 MED ORDER — ACETAMINOPHEN 325 MG PO TABS
650.0000 mg | ORAL_TABLET | ORAL | Status: DC | PRN
Start: 2020-09-29 — End: 2020-09-29
  Administered 2020-09-29: 650 mg via ORAL

## 2020-09-29 MED ORDER — ONDANSETRON HCL 4 MG/2ML IJ SOLN
INTRAMUSCULAR | Status: AC
  Filled 2020-09-29: qty 2

## 2020-09-29 MED ORDER — ACETAMINOPHEN 325 MG PO TABS
ORAL_TABLET | ORAL | Status: AC
  Filled 2020-09-29: qty 2

## 2020-09-29 MED ORDER — OXYTOCIN-SODIUM CHLORIDE 30-0.9 UT/500ML-% IV SOLN
1.0000 m[IU]/min | INTRAVENOUS | Status: DC
  Administered 2020-09-29: 2 m[IU]/min via INTRAVENOUS
  Filled 2020-09-29: qty 500

## 2020-09-29 MED ORDER — OXYCODONE-ACETAMINOPHEN 5-325 MG PO TABS: 1.0000 | ORAL_TABLET | ORAL | Status: DC | PRN

## 2020-09-29 MED ORDER — OXYTOCIN-SODIUM CHLORIDE 30-0.9 UT/500ML-% IV SOLN: 1.0000 m[IU]/min | INTRAVENOUS | Status: DC

## 2020-09-29 MED ORDER — TERBUTALINE SULFATE 1 MG/ML IJ SOLN
0.2500 mg | Freq: Once | INTRAMUSCULAR | Status: AC | PRN
  Administered 2020-09-29: 0.25 mg via SUBCUTANEOUS

## 2020-09-29 MED ORDER — MISOPROSTOL 200 MCG PO TABS: 50.0000 ug | ORAL_TABLET | ORAL | Status: DC | PRN

## 2020-09-29 MED ORDER — OXYCODONE-ACETAMINOPHEN 5-325 MG PO TABS: 2.0000 | ORAL_TABLET | ORAL | Status: DC | PRN

## 2020-09-29 NOTE — Progress Notes (Signed)
InterpreterHeide Scales [phone]) used for admission.

## 2020-09-29 NOTE — H&P (Signed)
Diana Hopkins is a 25 y.o. female [phone] presenting for ECV d/t to malpresentation, breech, in the setting of cholestasis. Pt has been NPO since 6 AM. She reports + FM, denies vaginal bleeding or LOF.  Growth scan 87 % on 09/09/2020  Prenatal course complicated by cholestasis  OB History    Gravida  6   Para  5   Term  2   Preterm  3   AB  0   Living  2     SAB  0   IAB      Ectopic      Multiple      Live Births  2          Past Medical History:  Diagnosis Date  . Cholestasis during pregnancy in third trimester   . Medical history non-contributory    Past Surgical History:  Procedure Laterality Date  . arm surgery    . HAND SURGERY     Family History: family history includes Hepatitis B in her father. Social History:  reports that she has never smoked. She has never used smokeless tobacco. She reports that she does not drink alcohol and does not use drugs.     Maternal Diabetes: No Genetic Screening: Declined Maternal Ultrasounds/Referrals: Normal Fetal Ultrasounds or other Referrals:  None Maternal Substance Abuse:  No Significant Maternal Medications:  None Significant Maternal Lab Results:  Group B Strep negative Other Comments:  None  Review of Systems History   Blood pressure 107/67, pulse 84, temperature 98 F (36.7 C), temperature source Oral, SpO2 100 %. Exam Physical Exam  Prenatal labs: ABO, Rh: --/--/B POS (04/16 1405) Antibody: POS (04/16 1405) Rubella: 2.99 (12/13 1038) RPR: NON REACTIVE (04/16 1405)  HBsAg: Negative (12/13 1038)  HIV: Non Reactive (02/23 0831)  GBS:     Assessment/Plan: IUP 37 0/7 weeks Malpresentation Cholestasis of pregnancy Language barrier  U/S confirmed Breech presentation. ECV reviewed with pt and FOB. Consent obtained. If successful will proceed to IOL, if not will proceed to c section. Phone interrupter used during H & P.    Hermina Staggers 09/29/2020, 2:05 PM

## 2020-09-29 NOTE — Procedures (Signed)
After informed verbal consent, Terbutaline 0.25 mg SQ given, ECV was attempted under Ultrasound guidance.  Forward roll attempted x 3. Success on 3rd attempt.   FHR was reactive before and after the procedure.  Binder placed. For IOL due to cholestasis. Pt. Tolerated the procedure well.

## 2020-09-29 NOTE — Progress Notes (Signed)
Diana Hopkins is a 25 y.o. [id number] at [redacted]w[redacted]d admitted for induction of labor due to cholestasis.  Subjective: Pt reports no pain, vaginal bleeding or LOF. Has no concerns at this time. Reports she is "ready to meet this baby".  Objective: BP (!) 96/59   Pulse (!) 115   Temp 98.2 F (36.8 C) (Oral)   Resp 15   SpO2 98%  No intake/output data recorded. No intake/output data recorded.  FHT:  FHR: 145 bpm, variability: moderate,  accelerations:  Present,  decelerations:  Absent UC:  Q 6-7 mins SVE:   Dilation: 3 Effacement (%): 50 Station: -3  Labs: Lab Results  Component Value Date   WBC 7.7 09/29/2020   HGB 11.1 (L) 09/29/2020   HCT 36.0 09/29/2020   MCV 85.9 09/29/2020   PLT 170 09/29/2020    Assessment / Plan: Induction of labor, will start Pitocin  Labor: Pitocin ordered Preeclampsia:  n/a Fetal Wellbeing:  Category I Pain Control:  prn I/D:  GBS negative Anticipated MOD:  NSVD  Brand Males, CNM 09/29/2020, 7:56 PM

## 2020-09-29 NOTE — Progress Notes (Signed)
Patient ID: Diana Hopkins, female   DOB: 1995/12/09, 25 y.o.   MRN: [id number] Pt has now elected to wait for a female provider to attempt ECV. Female provider will be available after 5.

## 2020-09-30 ENCOUNTER — Inpatient Hospital Stay (HOSPITAL_COMMUNITY): Payer: Medicaid Other | Admitting: Anesthesiology

## 2020-09-30 ENCOUNTER — Encounter (HOSPITAL_COMMUNITY): Payer: Self-pay | Admitting: Family Medicine

## 2020-09-30 DIAGNOSIS — O2662 Liver and biliary tract disorders in childbirth: Secondary | ICD-10-CM | POA: Diagnosis not present

## 2020-09-30 DIAGNOSIS — K831 Obstruction of bile duct: Secondary | ICD-10-CM | POA: Diagnosis not present

## 2020-09-30 DIAGNOSIS — Z3A37 37 weeks gestation of pregnancy: Secondary | ICD-10-CM | POA: Diagnosis not present

## 2020-09-30 LAB — RPR: RPR Ser Ql: NONREACTIVE

## 2020-09-30 MED ORDER — PRENATAL MULTIVITAMIN CH
1.0000 | ORAL_TABLET | Freq: Every day | ORAL | Status: DC
  Administered 2020-10-01 – 2020-10-02 (×2): 1 via ORAL
  Filled 2020-09-30 (×3): qty 1

## 2020-09-30 MED ORDER — WITCH HAZEL-GLYCERIN EX PADS: 1.0000 "application " | MEDICATED_PAD | CUTANEOUS | Status: DC | PRN

## 2020-09-30 MED ORDER — ONDANSETRON HCL 4 MG PO TABS: 4.0000 mg | ORAL_TABLET | ORAL | Status: DC | PRN

## 2020-09-30 MED ORDER — EPHEDRINE 5 MG/ML INJ: 10.0000 mg | INTRAVENOUS | Status: DC | PRN

## 2020-09-30 MED ORDER — ONDANSETRON HCL 4 MG/2ML IJ SOLN: 4.0000 mg | INTRAMUSCULAR | Status: DC | PRN

## 2020-09-30 MED ORDER — IBUPROFEN 600 MG PO TABS
600.0000 mg | ORAL_TABLET | Freq: Four times a day (QID) | ORAL | Status: DC
  Administered 2020-09-30 – 2020-10-02 (×8): 600 mg via ORAL
  Filled 2020-09-30 (×9): qty 1

## 2020-09-30 MED ORDER — BENZOCAINE-MENTHOL 20-0.5 % EX AERO
1.0000 "application " | INHALATION_SPRAY | CUTANEOUS | Status: DC | PRN
  Administered 2020-09-30: 1 via TOPICAL
  Filled 2020-09-30: qty 56

## 2020-09-30 MED ORDER — DIBUCAINE (PERIANAL) 1 % EX OINT: 1.0000 "application " | TOPICAL_OINTMENT | CUTANEOUS | Status: DC | PRN

## 2020-09-30 MED ORDER — DIPHENHYDRAMINE HCL 25 MG PO CAPS
25.0000 mg | ORAL_CAPSULE | Freq: Four times a day (QID) | ORAL | Status: DC | PRN
  Administered 2020-09-30: 25 mg via ORAL
  Filled 2020-09-30: qty 1

## 2020-09-30 MED ORDER — DIPHENHYDRAMINE HCL 50 MG/ML IJ SOLN: 12.5000 mg | INTRAMUSCULAR | Status: DC | PRN

## 2020-09-30 MED ORDER — ACETAMINOPHEN 325 MG PO TABS: 650.0000 mg | ORAL_TABLET | ORAL | Status: DC | PRN

## 2020-09-30 MED ORDER — LACTATED RINGERS IV SOLN: 500.0000 mL | Freq: Once | INTRAVENOUS | Status: DC

## 2020-09-30 MED ORDER — LIDOCAINE-EPINEPHRINE (PF) 2 %-1:200000 IJ SOLN
INTRAMUSCULAR | Status: DC | PRN
  Administered 2020-09-30: 4 mL via EPIDURAL

## 2020-09-30 MED ORDER — COCONUT OIL OIL: 1.0000 "application " | TOPICAL_OIL | Status: DC | PRN

## 2020-09-30 MED ORDER — PHENYLEPHRINE 40 MCG/ML (10ML) SYRINGE FOR IV PUSH (FOR BLOOD PRESSURE SUPPORT): 80.0000 ug | PREFILLED_SYRINGE | INTRAVENOUS | Status: DC | PRN

## 2020-09-30 MED ORDER — HYDROXYZINE HCL 25 MG PO TABS
25.0000 mg | ORAL_TABLET | Freq: Three times a day (TID) | ORAL | Status: DC | PRN
  Administered 2020-09-30 – 2020-10-01 (×3): 25 mg via ORAL
  Filled 2020-09-30 (×3): qty 1

## 2020-09-30 MED ORDER — TETANUS-DIPHTH-ACELL PERTUSSIS 5-2.5-18.5 LF-MCG/0.5 IM SUSY: 0.5000 mL | PREFILLED_SYRINGE | Freq: Once | INTRAMUSCULAR | Status: DC

## 2020-09-30 MED ORDER — SIMETHICONE 80 MG PO CHEW: 80.0000 mg | CHEWABLE_TABLET | ORAL | Status: DC | PRN

## 2020-09-30 MED ORDER — SENNOSIDES-DOCUSATE SODIUM 8.6-50 MG PO TABS
2.0000 | ORAL_TABLET | ORAL | Status: DC
  Administered 2020-10-01 – 2020-10-02 (×2): 2 via ORAL
  Filled 2020-09-30 (×4): qty 2

## 2020-09-30 MED ORDER — FENTANYL-BUPIVACAINE-NACL 0.5-0.125-0.9 MG/250ML-% EP SOLN
12.0000 mL/h | EPIDURAL | Status: DC | PRN
  Administered 2020-09-30: 12 mL/h via EPIDURAL
  Filled 2020-09-30: qty 250

## 2020-09-30 MED ORDER — MEASLES, MUMPS & RUBELLA VAC IJ SOLR: 0.5000 mL | Freq: Once | INTRAMUSCULAR | Status: DC

## 2020-09-30 NOTE — Discharge Summary (Addendum)
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 yo [phone] at [redacted]w[redacted]d was admitted to the hospital 09/29/2020 for induction of labor.  Indication for induction: Cholestasis of pregnancy. Fetus was breech on admission. ECV was successfully performed and labor was induced.  Patient had an uncomplicated labor course as follows: Membrane Rupture Time/Date: 1:30 AM ,09/30/2020   Delivery Method:Vaginal, Spontaneous  Episiotomy: None  Lacerations:  2nd degree  Details of delivery can be found in separate delivery note.  Patient had a routine postpartum course. She was very concerned about continued pruritis from Cholestasis. Pruritis improved by PPD#2 and Liver enzymes normalized. Dicussed that Cholestasis resolves after pregnancy but that there is a high risk of recurrence with future pregnancies. Pt also reports few episodes  of incontinence. Will refer to pelvic floor PT OP if not improved by PP appointment in 4 to 6 weeks. SW consult done for pt reporting that she cannot afford diapers and formula. Patient is discharged home 10/02/20.  Newborn Data: Birth date:09/30/2020  Birth time:7:04 AM  Gender:Female  Living status:Living  Apgars:8 ,9  Weight:3289 g   Magnesium Sulfate received: No BMZ received: Yes Rhophylac:N/A MMR:N/A T-DaP:Given prenatally Flu: Yes Transfusion:No  Physical exam  Vitals:   10/01/20 0508 10/01/20 1525 10/01/20 2111 10/02/20 0508  BP: (!) 101/54 (!) 101/54 (!) 111/53 (!) 98/53  Pulse: 81 78 92 72  Resp: $Remo'16 18 18 18  'TVJng$ Temp: 98.1 F (36.7 C) 98 F (36.7 C) 98.5 F (36.9 C) 98.4 F (36.9 C)  TempSrc: Oral Oral Oral Oral  SpO2: 99% 100%     General: alert, cooperative and no distress Lochia: appropriate Uterine Fundus: firm Incision: N/A DVT Evaluation: No evidence of DVT seen on physical exam. Labs: Lab Results  Component Value Date   WBC 7.7 09/29/2020   HGB 11.1 (L) 09/29/2020   HCT 36.0 09/29/2020   MCV 85.9 09/29/2020   PLT 170 09/29/2020   CMP Latest Ref Rng & Units 10/01/2020  Glucose 70 - 99 mg/dL 102(H)  BUN 6 - 20 mg/dL 8  Creatinine 0.44 - 1.00 mg/dL 0.60  Sodium 135 - 145 mmol/L 138  Potassium 3.5 - 5.1 mmol/L 3.7  Chloride 98 - 111 mmol/L 106  CO2 22 - 32 mmol/L 25  Calcium 8.9 - 10.3 mg/dL  8.7(L)  Total Protein 6.5 - 8.1 g/dL 4.9(L)  Total Bilirubin 0.3 - 1.2 mg/dL 0.2(L)  Alkaline Phos 38 - 126 U/L 191(H)  AST 15 - 41 U/L 29  ALT 0 - 44 U/L 43   Edinburgh Score: No flowsheet data found.   After visit meds:  Allergies as of 10/02/2020   No Known Allergies      Medication List     STOP taking these medications    acetaminophen 650 MG CR tablet Commonly known as: Tylenol 8 Hour Replaced by: acetaminophen 325 MG tablet   diphenhydrAMINE 25 MG tablet Commonly known as: Benadryl Allergy Ultratabs   hydrOXYzine 25 MG  capsule Commonly known as: Vistaril   pantoprazole 40 MG tablet Commonly known as: Protonix   terconazole 0.4 % vaginal cream Commonly known as: TERAZOL 7   ursodiol 500 MG tablet Commonly known as: ACTIGALL       TAKE these medications    acetaminophen 325 MG tablet Commonly known as: Tylenol Take 3 tablets (975 mg total) by mouth every 6 (six) hours as needed for moderate pain (for pain scale < 4). Replaces: acetaminophen 650 MG CR tablet   benzocaine-Menthol 20-0.5 % Aero Commonly known as: DERMOPLAST Apply 1 application topically as needed for up to 7 days for irritation (perineal discomfort).   eucerin lotion Apply topically as needed for dry skin.   ibuprofen 600 MG tablet Commonly known as: ADVIL Take 1 tablet (600 mg total) by mouth every 6 (six) hours as needed for moderate pain.   permethrin 5 % cream Commonly known as: ELIMITE Apply 1 application topically daily.   Prenatal Vitamins 28-0.8 MG Tabs Take 1 tablet by mouth daily.   zolpidem 5 MG tablet Commonly known as: AMBIEN Take 1 tablet (5 mg total) by mouth at bedtime as needed for sleep.         Discharge home in stable condition Infant Feeding: Breast Infant Disposition:home with mother Discharge instruction: per After Visit Summary and Postpartum booklet. Activity: Advance as tolerated. Pelvic rest for 6 weeks.  Diet: routine diet Future Appointments:No future appointments. Follow up Visit:  [place] [facility] [place] at [facility] Follow up in 4 week(s).   Specialty: Obstetrics and Gynecology Why: will call you schedule postpartum appointment  Contact information: [address]  [place] [address] [facility] Follow up.   Specialty: Obstetrics and Gynecology Why: as needed in pregnancy-related emergencies Contact information: [address] [id number] [place] [phone]               Message sent to [facility] 09/30/20 by Sylvester Harder.   Please schedule this patient for a In person postpartum visit in 6 weeks with the following provider: Any provider. Additional Postpartum F/U: none   High risk pregnancy complicated by:  cholestasis Delivery mode:  Vaginal, Spontaneous  Anticipated Birth Control: Planning IUD   10/02/2020 Manya Silvas, CNM

## 2020-09-30 NOTE — Anesthesia Postprocedure Evaluation (Signed)
Anesthesia Post Note  Patient: Electrical engineer  Procedure(s) Performed: AN AD HOC LABOR EPIDURAL     Patient location during evaluation: Mother Baby Anesthesia Type: Epidural Level of consciousness: awake and alert Pain management: pain level controlled Vital Signs Assessment: post-procedure vital signs reviewed and stable Respiratory status: spontaneous breathing Cardiovascular status: blood pressure returned to baseline Postop Assessment: no headache, adequate PO intake, no backache, patient able to bend at knees, able to ambulate, epidural receding and no apparent nausea or vomiting Anesthetic complications: no   No complications documented.  Last Vitals:  Vitals:   09/30/20 1000 09/30/20 1415  BP: 124/69 102/62  Pulse: 80 84  Resp: 16 16  Temp: 36.8 C 36.8 C  SpO2: 99% 98%    Last Pain:  Vitals:   09/30/20 1415  TempSrc: Oral  PainSc:    Pain Goal:                   Salome Arnt

## 2020-09-30 NOTE — Anesthesia Procedure Notes (Signed)
Epidural Patient location during procedure: OB Start time: 09/30/2020 2:30 AM End time: 09/30/2020 2:40 AM  Staffing Anesthesiologist: Elmer Picker, MD Performed: anesthesiologist   Preanesthetic Checklist Completed: patient identified, IV checked, risks and benefits discussed, monitors and equipment checked, pre-op evaluation and timeout performed  Epidural Patient position: sitting Prep: DuraPrep and site prepped and draped Patient monitoring: continuous pulse ox, blood pressure, heart rate and cardiac monitor Approach: midline Location: L3-L4 Injection technique: LOR air  Needle:  Needle type: Tuohy  Needle gauge: 17 G Needle length: 9 cm Needle insertion depth: 5 cm Catheter type: closed end flexible Catheter size: 19 Gauge Catheter at skin depth: 11 cm Test dose: negative  Assessment Sensory level: T8 Events: blood not aspirated, injection not painful, no injection resistance, no paresthesia and negative IV test  Additional Notes Patient identified. Risks/Benefits/Options discussed with patient including but not limited to bleeding, infection, nerve damage, paralysis, failed block, incomplete pain control, headache, blood pressure changes, nausea, vomiting, reactions to medication both or allergic, itching and postpartum back pain. Confirmed with bedside nurse the patient's most recent platelet count. Confirmed with patient that they are not currently taking any anticoagulation, have any bleeding history or any family history of bleeding disorders. Patient expressed understanding and wished to proceed. All questions were answered. Sterile technique was used throughout the entire procedure. Please see nursing notes for vital signs. Test dose was given through epidural catheter and negative prior to continuing to dose epidural or start infusion. Warning signs of high block given to the patient including shortness of breath, tingling/numbness in hands, complete motor block,  or any concerning symptoms with instructions to call for help. Patient was given instructions on fall risk and not to get out of bed. All questions and concerns addressed with instructions to call with any issues or inadequate analgesia.  Reason for block:procedure for pain

## 2020-09-30 NOTE — Progress Notes (Addendum)
Labor Progress Note Staley Budzinski is a 25 y.o. [id number] at [redacted]w[redacted]d presented for IOL-cholestasis s/p ECV 09/29/20. S: Doing well without complaints.  O:  BP (!) 99/45 (BP Location: Right Arm)   Pulse 88   Temp 97.9 F (36.6 C) (Oral)   Resp 16   SpO2 98%  EFM: baseline 150bpm/mod variability/+ accels/no decels Toco: q1-4 min  CVE:5/80/-2, vertex  A&P: 25 y.o. [id number] [redacted]w[redacted]d presented for IOL-cholestasis s/p ECV 09/29/20. #IOL: Pit started @2030 , AROM performed with this check, clear fluid. IUPC placed. Pit currently @16mL /hr, continue to titrate. #Pain: PRN #FWB: cat 1 #GBS negative  , MD 1:58 AM

## 2020-09-30 NOTE — Lactation Note (Signed)
This note was copied from a baby's chart. Lactation Consultation Note  Patient Name: Diana Hopkins [id number]'Y Date: 09/30/2020 Reason for consult: Initial assessment;Early term 37-38.6wks;1st time breastfeeding Age:26 hours  P3 mother whose infant is now 31 hours old.  This is an ETI at 37+1 weeks.  Mother breast/formula fed her first two children (now 13 and 15 years old) for a very limited time due to "low supply."  She ended up formula feeding.  Mother desires to exclusively breast feed this baby.  [employer] interpreter [phone]) used for interpretation.  RN had recently assisted mother with feeding.  Reviewed breast feeding basics with mother.  Provided manual pump to help with breast stimulation and nipple eversion.  Mother's nipples are short shafted.  #24 flange is the appropriate size now but asked mother to observe her nipples with each pumping session since I foresee her going to a #27 in the near future.  Mother can easily hand express colostrum and will spoon feed any EBM she obtains. Mother is not going to initiate the DEBP at this time; will wait to see how baby feeds.  Encouraged to feed at least every three hours or sooner if baby shows cues.  Reviewed cues.  She will call her RN/LC for assistance as needed. Mother had a visitor enter near the end of my visit and no longer was able to focus on the interpreter.  I am unsure whether or not she plans to obtain a DEBP.  This will require follow up with the next LC.     Maternal Data Has patient been taught Hand Expression?: Yes Does the patient have breastfeeding experience prior to this delivery?: Yes (Very limited; mother stated she tried but did not have any milk)  Feeding Mother's Current Feeding Choice: Breast Milk  LATCH Score Latch: Repeated attempts needed to sustain latch, nipple held in mouth throughout feeding, stimulation needed to elicit sucking reflex.  Audible Swallowing: A few with stimulation  Type of Nipple:  Flat  Comfort (Breast/Nipple): Soft / non-tender  Hold (Positioning): Assistance needed to correctly position infant at breast and maintain latch.  LATCH Score: 6   Lactation Tools Discussed/Used Tools: Pump Breast pump type: Manual Pump Education: Setup, frequency, and cleaning Reason for Pumping: Nipple eversion; breast stimulation Pumping frequency: Prn  Interventions Interventions: Breast feeding basics reviewed;Assisted with latch;Breast massage;Hand express;Breast compression;Support pillows;Adjust position  Discharge Pump: Manual  Consult Status Consult Status: Follow-up Date: 10/01/20 Follow-up type: In-patient    Dora Sims 09/30/2020, 3:25 PM

## 2020-09-30 NOTE — Lactation Note (Signed)
This note was copied from a baby's chart. Lactation Consultation Note  Patient Name: Diana Hopkins [id number]'I Date: 09/30/2020 Reason for consult: L&D Initial assessment;Primapara;1st time breastfeeding;Early term 37-38.6wks;Other (Comment) (LC arrived at 0750 - 50 mins PP. baby alert and rooting. LC assisted with positioning, and baby latched for 7 mins , released / relatched for 20 mins . few swallows. I- pad interpreter Cipriano Mile [phone] - Ahmed) Age:    LD visit at 34 mins./ P 1        Maternal Data Has patient been taught Hand Expression?:  (mom sleepy on and off)  Feeding Mother's Current Feeding Choice: Breast Milk  LATCH Score Latch: Repeated attempts needed to sustain latch, nipple held in mouth throughout feeding, stimulation needed to elicit sucking reflex.  Audible Swallowing: A few with stimulation  Type of Nipple: Everted at rest and after stimulation (areola edema)  Comfort (Breast/Nipple): Soft / non-tender  Hold (Positioning): Full assist, staff holds infant at breast  LATCH Score: 6   Lactation Tools Discussed/Used    Interventions Interventions: Breast feeding basics reviewed;Assisted with latch;Skin to skin;Hand express;Reverse pressure;Adjust position  Discharge    Consult Status Consult Status: Follow-up (need to F/U from initial LC visit in LD) Date: 09/30/20 Follow-up type: In-patient    Diana Hopkins 09/30/2020, 8:24 AM

## 2020-09-30 NOTE — Anesthesia Preprocedure Evaluation (Signed)
Anesthesia Evaluation  Patient identified by MRN, date of birth, ID band Patient awake    Reviewed: Allergy & Precautions, NPO status , Patient's Chart, lab work & pertinent test results  Airway Mallampati: II  TM Distance: >3 FB Neck ROM: Full    Dental no notable dental hx.    Pulmonary neg pulmonary ROS,    Pulmonary exam normal breath sounds clear to auscultation       Cardiovascular negative cardio ROS Normal cardiovascular exam Rhythm:Regular Rate:Normal     Neuro/Psych negative neurological ROS  negative psych ROS   GI/Hepatic negative GI ROS, Neg liver ROS,   Endo/Other  negative endocrine ROS  Renal/GU negative Renal ROS  negative genitourinary   Musculoskeletal negative musculoskeletal ROS (+)   Abdominal   Peds  Hematology negative hematology ROS (+)   Anesthesia Other Findings IOL for cholestasis  Reproductive/Obstetrics (+) Pregnancy                             Anesthesia Physical Anesthesia Plan  ASA: II  Anesthesia Plan: Epidural   Post-op Pain Management:    Induction:   PONV Risk Score and Plan: Treatment may vary due to age or medical condition  Airway Management Planned: Natural Airway  Additional Equipment:   Intra-op Plan:   Post-operative Plan:   Informed Consent: I have reviewed the patients History and Physical, chart, labs and discussed the procedure including the risks, benefits and alternatives for the proposed anesthesia with the patient or authorized representative who has indicated his/her understanding and acceptance.       Plan Discussed with: Anesthesiologist  Anesthesia Plan Comments: (Patient identified. Risks, benefits, options discussed with patient including but not limited to bleeding, infection, nerve damage, paralysis, failed block, incomplete pain control, headache, blood pressure changes, nausea, vomiting, reactions to  medication, itching, and post partum back pain. Confirmed with bedside nurse the patient's most recent platelet count. Confirmed with the patient that they are not taking any anticoagulation, have any bleeding history or any family history of bleeding disorders. Patient expressed understanding and wishes to proceed. All questions were answered.   Female providers only)        Anesthesia Quick Evaluation

## 2020-10-01 LAB — COMPREHENSIVE METABOLIC PANEL
ALT: 43 U/L (ref 0–44)
AST: 29 U/L (ref 15–41)
Albumin: 1.9 g/dL — ABNORMAL LOW (ref 3.5–5.0)
Alkaline Phosphatase: 191 U/L — ABNORMAL HIGH (ref 38–126)
Anion gap: 7 (ref 5–15)
BUN: 8 mg/dL (ref 6–20)
CO2: 25 mmol/L (ref 22–32)
Calcium: 8.7 mg/dL — ABNORMAL LOW (ref 8.9–10.3)
Chloride: 106 mmol/L (ref 98–111)
Creatinine, Ser: 0.6 mg/dL (ref 0.44–1.00)
GFR, Estimated: >60 mL/min (ref >60)
Glucose, Bld: 102 mg/dL — ABNORMAL HIGH (ref 70–99)
Potassium: 3.7 mmol/L (ref 3.5–5.1)
Sodium: 138 mmol/L (ref 135–145)
Total Bilirubin: 0.2 mg/dL — ABNORMAL LOW (ref 0.3–1.2)
Total Protein: 4.9 g/dL — ABNORMAL LOW (ref 6.5–8.1)

## 2020-10-01 NOTE — Progress Notes (Addendum)
POSTPARTUM PROGRESS NOTE  Post Partum Day 1  Subjective:  Diana Hopkins is a 25 y.o. [id number] s/p VD via IOL for cholestasis of pregnancy at [redacted]w[redacted]d.  She reports she is doing well overall, however continues to have severe generalized pruritis and vaginal burning on stiches. She is frustrated because she was told her itching would stop immediately after birth. No acute events overnight. She denies any problems with ambulating, voiding or po intake. Denies nausea or vomiting.  Pain is well controlled.  Lochia is mild.  Objective: Blood pressure (!) 101/54, pulse 81, temperature 98.1 F (36.7 C), temperature source Oral, resp. rate 16, SpO2 99 %, unknown if currently breastfeeding.  Physical Exam:  General: alert, cooperative and no distress Chest: no respiratory distress Heart: distal pulses intact Abdomen: soft Uterine Fundus: firm, appropriately tender DVT Evaluation: No calf swelling or tenderness Extremities: non-pitting edema bilaterally and equally  Skin: warm, dry, scattered scratch marks present on upper and lower extremities without underlying rash, no surrounding erythema or drainage from areas  Recent Labs    09/29/20 1346  HGB 11.1*  HCT 36.0    Assessment/Plan: [name] is a 25 y.o. [id number] s/p VD at [redacted]w[redacted]d   #PPD#1 - Doing well  Routine postpartum care  #Cholestatsis of pregnancy:  Continued severe pruritis. Educated on pathophysiology. Set expectations that this should hopefully improve within the next couple days now postpartum and provided reassurance. Discussed monitoring and lab work in one week if still symptomatic, however patient adamant for labwork. Will obtain CMP today. Cont Atarax for itching.    Contraception: IUD at postpartum visit  Feeding: Breast/formula (having difficulty with breast milk supply, working with lactation)   Dispo: Plan for discharge tomorrow.   LOS: 2 days   Leticia Penna, DO  Family Medicine PGY-3  10/01/2020, 9:18 AM   I  was present for the exam and agree with above. Liver enzymes back to normal. Itching improving.   CMP Latest Ref Rng & Units 10/01/2020 09/24/2020 09/22/2020  Glucose 70 - 99 mg/dL 810(F) 99 99  BUN 6 - 20 mg/dL 8 7 8   Creatinine 0.44 - 1.00 mg/dL 7.51 0.25)  Sodium 135 - 145 mmol/L 138 136 139  Potassium 3.5 - 5.1 mmol/L 3.7 4.0 4.4  Chloride 98 - 111 mmol/L 106 105 106  CO2 22 - 32 mmol/L 25 20(L) 17(L)  Calcium 8.9 - 10.3 mg/dL 8.52(D) 7.8(E) 9.0  Total Protein 6.5 - 8.1 g/dL 4.9(L) 5.6(L) 5.9(L)  Total Bilirubin 0.3 - 1.2 mg/dL 4.2(P) 0.3 0.4  Alkaline Phos 38 - 126 U/L 191(H) 270(H) 338(H)  AST 15 - 41 U/L 29 40 61(H)  ALT 0 - 44 U/L 43 81(H) 123(H)     5.3(I, Katrinka Blazing, CNM 10/01/2020 2:01 PM

## 2020-10-01 NOTE — Progress Notes (Signed)
CSW met with MOB due to consult for diapers and formula. Via [employer] #[id number] CSW informed MOB that they would complete a referral to [facility] for additional resources. CSW informed MOB that until [facility] reaches out, she able to purchase formula with her WIC. MOB expressed understanding and no additional needs at this time.    Samyria Rudie, MSW, LCSW-A Clinical Social Worker [phone] 

## 2020-10-01 NOTE — Lactation Note (Signed)
This note was copied from a baby's chart. Lactation Consultation Note Mom resting but appears awake. Room dark. Baby sleeping. Family member sat up. LC introduced self. Asked mom if she needed any help or has any questions. Family member asked mom , mom stated no she was good right now.  Patient Name: Diana Hopkins MEQAS'T Date: 10/01/2020 Reason for consult: Follow-up assessment;Early term 37-38.6wks Age:26 hours  Maternal Data    Feeding Mother's Current Feeding Choice: Breast Milk and Donor Milk  LATCH Score                    Lactation Tools Discussed/Used    Interventions    Discharge    Consult Status Consult Status: Follow-up Date: 10/02/20 Follow-up type: In-patient    Charyl Dancer 10/01/2020, 11:38 PM

## 2020-10-01 NOTE — Clinical Social Work Maternal (Signed)
CLINICAL SOCIAL WORK MATERNAL/CHILD NOTE  Patient Details  Name: Diana Hopkins MRN: [id number] Date of Birth: [birth year]  Date:  10/01/2020  Clinical Social Worker Initiating Note:  Darcus Austin, MSW Date/Time: Initiated:  10/01/20/1259     Child's Name:  undecided   Biological Parents:  Father,Mother Ladonna Snide Rhetta Mura, 26 yrs old.)   Need for Interpreter:  Other (Comment Required) Eduard Clos)   Reason for Referral:  Late or No Prenatal Care    Address:  [address] [place] [address]    Phone number:  [phone] (home)     Additional phone number:   Household Members/Support Persons (HM/SP):   Household Member/Support Person 1,Household Member/Support Person 2,Household Member/Support Person 3,Household Member/Support Person 4   HM/SP Name Relationship DOB or Age  HM/SP -1 Waili Engineer, manufacturing systems Spouse 46 yrs old  HM/SP -2 Rayha Research scientist (physical sciences) Daughter 73 yrs old  HM/SP -3 Baheer Research scientist (physical sciences) Son 69 yrs old  HM/SP -[employer] in [employer] coach 80 yrs old  HM/SP -5        HM/SP -6        HM/SP -7        HM/SP -8          Natural Supports (not living in the home):  Spouse/significant other,Extended Family   Professional Supports:     Employment: Unemployed   Type of Work:     Education:  Other (comment) (10 th grade)   Homebound arranged:    Museum/gallery curator Resources:  Medicare    Other Resources:  [place] Stamps    Cultural/Religious Considerations Which May Impact Care:    Strengths:  Ability to meet basic needs ,Home prepared for child    Psychotropic Medications:         Pediatrician:       Pediatrician List:   [place]      Pediatrician Fax Number:    Risk Factors/Current Problems:  Other(Comment) [facility])   Cognitive State:  Alert ,Able to Concentrate ,Linear Thinking    Mood/Affect:  Interested ,Calm ,Bright ,Happy ,Relaxed  ,Comfortable    CSW Assessment: CSW met with MOB to complete assessment for late prenatal care. CSW observed MOB resting in bed, infant in bassinet and brother in law daughter's on couch. Via [employer], [phone] [facility] CSW explained role and reason for consult. MOB was pleasant, polite and engaged with CSW. MOB reported, her initial prenatal appointment was in September 2021 before being evacuated from [place]. MOB reported, once being settled in the states she was able to receive prenatal care in December 2021. MOB denied any additional barriers.    MOB reported, FOB and brother in law are her supports. MOB reported, "she is fine and not depressed". MOB denied SI, HI and DV when CSW assessed for safety.   MOB reported, she received WIC/FS. CSW informed MOB about adding infant to WIS/FS.   MOB reported, she has chosen infant's pediatrician, but unsure of the name. MOB reported, FOB recently purchased a vehicle and there are no barriers to follow up care. MOB reported, she has all essentials needed to care for infant. MOB reported, infant has a car seat and bassinet.     CSW provided education regarding the baby blues period vs. perinatal mood disorders, discussed treatment and gave resources for mental health follow up if concerns arise. CSW recommends self- evaluation  during the postpartum time period using the New Mom Checklist from Postpartum Progress and encouraged MOB to contact a medical professional if symptoms are noted at any time.     CSW provided education on Sudden Infant Death Syndrome (SIDS).   CSW informed MOB of Drug Screen Policy and MOB was understanding of protocol.CSW will continue to follow the UDS/CDS and will make [facility] CPS report if warranted. MOB denied any substance nor CPS involvement.   CSW identifies no further need for intervention or barriers to discharge at this time.  CSW Plan/Description:  CSW Will Continue to Monitor Umbilical Cord Tissue Drug Screen Results and  Make Report if [facility] Drug Screen Policy Information,No Further Intervention Required/No Barriers to Discharge,Sudden Infant Death Syndrome (SIDS) Education,Perinatal Mood and Anxiety Disorder (PMADs) Education    Darcus Austin, [place] 10/01/2020, 1:10 PM

## 2020-10-02 ENCOUNTER — Other Ambulatory Visit: Payer: Self-pay | Admitting: Advanced Practice Midwife

## 2020-10-02 DIAGNOSIS — N393 Stress incontinence (female) (male): Secondary | ICD-10-CM

## 2020-10-02 LAB — BPAM RBC
Blood Product Expiration Date: 202205112359
Blood Product Expiration Date: 202205112359
Unit Type and Rh: 7300
Unit Type and Rh: 7300

## 2020-10-02 LAB — TYPE AND SCREEN
ABO/RH(D): B POS
Antibody Screen: POSITIVE
Donor AG Type: NEGATIVE
Donor AG Type: NEGATIVE
Unit division: 0
Unit division: 0

## 2020-10-02 MED ORDER — PRENATAL VITAMINS 28-0.8 MG PO TABS: 1.0000 | ORAL_TABLET | Freq: Every day | ORAL | 2 refills | Status: DC

## 2020-10-02 MED ORDER — BENZOCAINE-MENTHOL 20-0.5 % EX AERO: 1.0000 "application " | INHALATION_SPRAY | CUTANEOUS | 2 refills | Status: AC | PRN

## 2020-10-02 MED ORDER — IBUPROFEN 600 MG PO TABS: 600.0000 mg | ORAL_TABLET | Freq: Four times a day (QID) | ORAL | 1 refills | Status: DC | PRN

## 2020-10-02 MED ORDER — HYDROXYZINE HCL 25 MG PO TABS: 25.0000 mg | ORAL_TABLET | Freq: Three times a day (TID) | ORAL | 0 refills | Status: DC | PRN

## 2020-10-02 MED ORDER — ZOLPIDEM TARTRATE 5 MG PO TABS: 5.0000 mg | ORAL_TABLET | Freq: Every evening | ORAL | 0 refills | Status: DC | PRN

## 2020-10-02 MED ORDER — ACETAMINOPHEN 325 MG PO TABS: 1000.0000 mg | ORAL_TABLET | Freq: Four times a day (QID) | ORAL | 1 refills | Status: DC | PRN

## 2020-10-02 NOTE — Lactation Note (Addendum)
This note was copied from a baby's chart. Lactation Consultation Note  Patient Name: Diana Hopkins [id number] Date: 10/02/2020 Reason for consult: Follow-up assessment Age:25 years old  Consult was done via [name] [phone]:  Follow up with 55 hours old infant with 6.35% weight loss at the time of this visit, per provider's request. Mother reports breastfeeding is going well but infant fall sleep at breast. Mother talks about some nipple discomfort with latch.  LC talked about having infant awake and alert prior to breastfeeding. Encouraged unswaddling and checking diaper before attempting latch. LC noted void and changed diaper. Mother latched in modified cradle position to right breast. No support pillows, neck/back support, mother holds breast with both hands. LC assisted with pillows, repositioned and aligned infant. Noted suckling, audible swallowing, breast tissue movement. Encouraged mother to sandwich breast. Demonstrated chin tug for deeper latch. Mother states it feels better.  Infant breastfeed for ~93minutes. Mother unlatched infant to use bathroom.    Discharge Plan: 1. Breastfeed following hunger cues or 8 - 12 times in 24h period  2. Ensure infant has a deep latch and/or chin tugging to improve latch. 3. Monitor voids and stools 4. If needed supplement with formula following guidelines, paced bottle feeding and fullness cues.   5. Encouraged maternal rest, hydration and food intake.  6. Contact Lactation Services or local resources for support, questions or concerns.    Mother has a sponsor to assist with [facility] application. Encouraged mother to contact Lactation services as needed.   Feeding Mother's Current Feeding Choice: Breast Milk and Formula  LATCH Score Latch: Grasps breast easily, tongue down, lips flanged, rhythmical sucking.  Audible Swallowing: Spontaneous and intermittent  Type of Nipple: Everted at rest and after stimulation  Comfort (Breast/Nipple):  Soft / non-tender  Hold (Positioning): Assistance needed to correctly position infant at breast and maintain latch.  LATCH Score: 9   Interventions Interventions: Breast feeding basics reviewed;Assisted with latch;Skin to skin;Adjust position;Support pillows;Position options;Expressed milk;Hand pump;Education  Discharge Discharge Education: Engorgement and breast care;Warning signs for feeding baby Pump: Manual WIC Program: Yes (Sponsor is assisting with application)  Consult Status Consult Status: Complete Date: 10/02/20 Follow-up type: Call as needed    Diana Hopkins A Diana Hopkins 10/02/2020, 2:51 PM

## 2020-10-02 NOTE — Discharge Instructions (Signed)
-take tylenol 1000 mg every 6 hours as needed for pain, alternate with ibuprofen 600 mg every 6 hours -drink plenty of water to help with breastfeeding -continue prenatal vitamins while you are breastfeeding -take iron pills every other day with vitamin c, this will help healing as well as breast feeding -think about birth control options-->bedisider.org is a great website! You can get any form of birth control from the health department for free   Postpartum Care After Vaginal Delivery The following information offers guidance about how to care for yourself from the time you deliver your baby to 6-12 weeks after delivery (postpartum period). If you have problems or questions, contact your health care provider for more specific instructions. Follow these instructions at home: Vaginal bleeding  It is normal to have vaginal bleeding (lochia) after delivery. Wear a sanitary pad for bleeding and discharge. ? During the first week after delivery, the amount and appearance of lochia is often similar to a menstrual period. ? Over the next few weeks, it will gradually decrease to a dry, yellow-brown discharge. ? For most women, lochia stops completely by 4-6 weeks after delivery, but can vary.  Change your sanitary pads frequently. Watch for any changes in your flow, such as: ? A sudden increase in volume. ? A change in color. ? Large blood clots.  If you pass a blood clot from your vagina, save it and call your health care provider. Do not flush blood clots down the toilet before talking with your health care provider.  Do not use tampons or douches until your health care provider approves.  If you are not breastfeeding, your period should return 6-8 weeks after delivery. If you are feeding your baby breast milk only, your period may not return until you stop breastfeeding. Perineal care  Keep the area between the vagina and the anus (perineum) clean and dry. Use medicated pads and  pain-relieving sprays and creams as directed.  If you had a surgical cut in the perineum (episiotomy) or a tear, check the area for signs of infection until you are healed. Check for: ? More redness, swelling, or pain. ? Fluid or blood coming from the cut or tear. ? Warmth. ? Pus or a bad smell.  You may be given a squirt bottle to use instead of wiping to clean the perineum area after you use the bathroom. Pat the area gently to dry it.  To relieve pain caused by an episiotomy, a tear, or swollen veins in the anus (hemorrhoids), take a warm sitz bath 2-3 times a day. In a sitz bath, the warm water should only come up to your hips and cover your buttocks.   Breast care  In the first few days after delivery, your breasts may feel heavy, full, and uncomfortable (breast engorgement). Milk may also leak from your breasts. Ask your health care provider about ways to help relieve the discomfort.  If you are breastfeeding: ? Wear a bra that supports your breasts and fits well. Use breast pads to absorb milk that leaks. ? Keep your nipples clean and dry. Apply creams and ointments as told. ? You may have uterine contractions every time you breastfeed for up to several weeks after delivery. This helps your uterus return to its normal size. ? If you have any problems with breastfeeding, notify your health care provider or lactation consultant.  If you are not breastfeeding: ? Avoid touching your breasts. Do not squeeze out (express) milk. Doing this can make your   breasts produce more milk. ? Wear a good-fitting bra and use cold packs to help with swelling. Intimacy and sexuality  Ask your health care provider when you can engage in sexual activity. This may depend upon: ? Your risk of infection. ? How fast you are healing. ? Your comfort and desire to engage in sexual activity.  You are able to get pregnant after delivery, even if you have not had your period. Talk with your health care provider  about methods of birth control (contraception) or family planning if you desire future pregnancies. Medicines  Take over-the-counter and prescription medicines only as told by your health care provider.  Take an over-the-counter stool softener to help ease bowel movements as told by your health care provider.  If you were prescribed an antibiotic medicine, take it as told by your health care provider. Do not stop taking the antibiotic even if you start to feel better.  Review all previous and current prescriptions to check for possible transfer into breast milk. Activity  Gradually return to your normal activities as told by your health care provider.  Rest as much as possible. Nap while your baby is sleeping. Eating and drinking  Drink enough fluid to keep your urine pale yellow.  To help prevent or relieve constipation, eat high-fiber foods every day.  Choose healthy eating to support breastfeeding or weight loss goals.  Take your prenatal vitamins until your health care provider tells you to stop.   General tips/recommendations  Do not use any products that contain nicotine or tobacco. These products include cigarettes, chewing tobacco, and vaping devices, such as e-cigarettes. If you need help quitting, ask your health care provider.  Do not drink alcohol, especially if you are breastfeeding.  Do not take medications or drugs that are not prescribed to you, especially if you are breastfeeding.  Visit your health care provider for a postpartum checkup within the first 3-6 weeks after delivery.  Complete a comprehensive postpartum visit no later than 12 weeks after delivery.  Keep all follow-up visits for you and your baby. Contact a health care provider if:  You feel unusually sad or worried.  Your breasts become red, painful, or hard.  You have a fever or other signs of an infection.  You have bleeding that is soaking through one pad an hour or you have blood  clots.  You have a severe headache that doesn't go away or you have vision changes.  You have nausea and vomiting and are unable to eat or drink anything for 24 hours. Get help right away if:  You have chest pain or difficulty breathing.  You have sudden, severe leg pain.  You faint or have a seizure.  You have thoughts about hurting yourself or your baby. If you ever feel like you may hurt yourself or others, or have thoughts about taking your own life, get help right away. Go to your nearest emergency department or:  Call your local emergency services (911 in the [place]  The National Suicide Prevention Lifeline at 1-800-273-8255. This suicide crisis helpline is open 24 hours a day.  Text the Crisis Text Line at 741741 (in the [place] Summary  The period of time after you deliver your newborn up to 6-12 weeks after delivery is called the postpartum period.  Keep all follow-up visits for you and your baby.  Review all previous and current prescriptions to check for possible transfer into breast milk.  Contact a health care provider if you feel   unusually sad or worried during the postpartum period. This information is not intended to replace advice given to you by your health care provider. Make sure you discuss any questions you have with your health care provider. Document Revised: 02/11/2020 Document Reviewed: 02/11/2020 Elsevier Patient Education  2021 Elsevier Inc.  

## 2020-10-02 NOTE — Progress Notes (Signed)
Assessment completed with use of interpreter [phone]

## 2020-10-03 ENCOUNTER — Telehealth: Payer: Self-pay | Admitting: *Deleted

## 2020-10-03 NOTE — Telephone Encounter (Signed)
Transition Care Management Unsuccessful Follow-up Telephone Call  Date of discharge and from where:  10/02/2020 -  [facility]  Attempts:  1st Attempt  Reason for unsuccessful TCM follow-up call:  Left voice message via [place] language interpreter

## 2020-10-04 NOTE — Telephone Encounter (Signed)
Transition Care Management Unsuccessful Follow-up Telephone Call  Date of discharge and from where:  10/02/2020 -  [facility]  Attempts:  2nd Attempt  Reason for unsuccessful TCM follow-up call:  Left voice message via [place] language interpreter

## 2020-10-05 NOTE — Telephone Encounter (Signed)
Transition Care Management Unsuccessful Follow-up Telephone Call  Date of discharge and from where:  10/02/2020 - [facility]  Attempts:  3rd Attempt  Reason for unsuccessful TCM follow-up call:  Left voice message via [place] language interpreter

## 2020-10-09 DIAGNOSIS — Z419 Encounter for procedure for purposes other than remedying health state, unspecified: Secondary | ICD-10-CM | POA: Diagnosis not present

## 2020-11-02 NOTE — Progress Notes (Signed)
Duplicate

## 2020-11-03 ENCOUNTER — Encounter: Payer: Self-pay | Admitting: Certified Nurse Midwife

## 2020-11-03 ENCOUNTER — Other Ambulatory Visit: Payer: Self-pay

## 2020-11-03 ENCOUNTER — Ambulatory Visit (INDEPENDENT_AMBULATORY_CARE_PROVIDER_SITE_OTHER): Payer: Medicaid Other | Admitting: Certified Nurse Midwife

## 2020-11-03 DIAGNOSIS — Z30430 Encounter for insertion of intrauterine contraceptive device: Secondary | ICD-10-CM

## 2020-11-03 DIAGNOSIS — Z23 Encounter for immunization: Secondary | ICD-10-CM | POA: Diagnosis not present

## 2020-11-03 DIAGNOSIS — K5900 Constipation, unspecified: Secondary | ICD-10-CM

## 2020-11-03 DIAGNOSIS — Z3202 Encounter for pregnancy test, result negative: Secondary | ICD-10-CM

## 2020-11-03 DIAGNOSIS — Z0289 Encounter for other administrative examinations: Secondary | ICD-10-CM | POA: Diagnosis not present

## 2020-11-03 LAB — POCT PREGNANCY, URINE: Preg Test, Ur: NEGATIVE

## 2020-11-03 MED ORDER — LEVONORGESTREL 20.1 MCG/DAY IU IUD
1.0000 | INTRAUTERINE_SYSTEM | Freq: Once | INTRAUTERINE | Status: AC
  Administered 2020-11-03: 1 via INTRAUTERINE

## 2020-11-03 MED ORDER — POLYETHYLENE GLYCOL 3350 17 G PO PACK: 17.0000 g | PACK | Freq: Every day | ORAL | 0 refills | Status: DC

## 2020-11-03 NOTE — Progress Notes (Signed)
Post Partum Visit Note  Diana Hopkins is a 25 y.o. [phone] female who presents for a postpartum visit. She is 4 weeks postpartum following a normal spontaneous vaginal delivery.  I have fully reviewed the prenatal and intrapartum course. The delivery was at 37 gestational weeks.  Anesthesia: epidural. Postpartum course has been normal aside from constipation. Baby is doing well. Baby is feeding by breast. Bleeding staining only. Bowel function is normal. Bladder function is normal. Patient is not sexually active. Contraception method is none. Postpartum depression screening: negative.   The pregnancy intention screening data noted above was reviewed. Potential methods of contraception were discussed. The patient elected to proceed with IUD or IUS.   Farsi interpreter present throughout visit for interpretation services (also provided help keeping patient calm during IUD insertion).  Edinburgh Postnatal Depression Scale - 11/03/20 1107      Edinburgh Postnatal Depression Scale:  In the Past 7 Days   I have been able to laugh and see the funny side of things. 0    I have looked forward with enjoyment to things. 1    I have blamed myself unnecessarily when things went wrong. 0    I have been anxious or worried for no good reason. 0    I have felt scared or panicky for no good reason. 0    Things have been getting on top of me. 1    I have been so unhappy that I have had difficulty sleeping. 0    I have felt sad or miserable. 0    The thought of harming myself has occurred to me. 0           Health Maintenance Due  Topic Date Due  . HPV VACCINES (1 - 2-dose series) Never done  . PAP-Cervical Cytology Screening  Never done  . PAP SMEAR-Modifier  Never done  . COVID-19 Vaccine (2 - Pfizer 3-dose series) 03/21/2020    The following portions of the patient's history were reviewed and updated as appropriate: allergies, current medications, past family history, past medical history, past  social history, past surgical history and problem list.  Review of Systems Pertinent items noted in HPI and remainder of comprehensive ROS otherwise negative.  Objective:  BP 98/66   Pulse 61   Ht 5\' 2"  (1.575 m)   Wt 157 lb 4.8 oz (71.4 kg)   Breastfeeding Yes   BMI 28.77 kg/m    General:  alert, cooperative, appears stated age and no distress   Breasts:  normal  Lungs: normal effort  Heart:  regular rate and rhythm  Abdomen: soft, non-tender; bowel sounds normal; no masses,  no organomegaly   Wound N/A  GU exam:  normal       IUD Insertion Procedure Note Patient identified, informed consent performed, consent signed.   Discussed risks of irregular bleeding, cramping, infection, malpositioning or misplacement of the IUD outside the uterus which may require further procedure such as laparoscopy. Also discussed >99% contraception efficacy, increased risk of ectopic pregnancy with failure of method.   Emphasized that this did not protect against STIs, condoms recommended during all sexual encounters. Time out was performed.  Urine pregnancy test negative.  Speculum placed in the vagina.  Cervix visualized and stabilized with speculum.  Cleaned with Betadine x 2.  Uterus sounded to 6.5cm.  Liletta IUD placed per manufacturer's recommendations.  Strings trimmed to 3 cm. Speculum removed. Patient did not tolerate procedure well. Procedure was stopped halfway through trying  to get cervix into speculum and clarified with patient that she wanted me to continue despite her discomfort. I asked twice, she confirmed twice and was able to tolerate the rest of the procedure.  Assessment:   Normal postpartum exam and IUD insertion  Plan:   Essential components of care per ACOG recommendations:  1.  Mood and well being: Patient with negative depression screening today. Reviewed local resources for support.  - Patient tobacco use? No.   - hx of drug use? No.    2. Infant care and feeding:   -Patient currently breastmilk feeding? Yes. Reviewed importance of draining breast regularly to support lactation.  -Social determinants of health (SDOH) reviewed in EPIC. No concerns   3. Sexuality, contraception and birth spacing - Patient does not want a pregnancy in the next year.  Desired family size is 6 children.  - Reviewed forms of contraception in tiered fashion. Patient desired IUD today.   - Patient was given post-procedure instructions.  She was advised to have backup contraception for one week.  Patient was also asked to check IUD strings periodically and follow up in 4 weeks for IUD check.  4. Sleep and fatigue -Encouraged family/partner/community support of 4 hrs of uninterrupted sleep to help with mood and fatigue  5. Physical Recovery  - Discussed patients delivery and complications. She describes her labor as good. - Patient had a Vaginal, no problems at delivery. Patient had a 2nd degree laceration. Perineal healing reviewed. Patient expressed understanding - Patient has urinary incontinence? No. - Patient is safe to resume physical and sexual activity - Pt has constipation - miralax prescribed  6.  Health Maintenance - HM due items addressed Yes - Last pap smear unknown. Pap smear not done at today's visit. Needs pap at IUD string check visit. -Breast Cancer screening indicated? No.   7. Chronic Disease/Pregnancy Condition follow up: None - PCP follow up  Bernerd Limbo, CNM [facility] [employer], [facility]

## 2020-11-08 ENCOUNTER — Encounter: Payer: Self-pay | Admitting: *Deleted

## 2020-11-09 DIAGNOSIS — Z419 Encounter for procedure for purposes other than remedying health state, unspecified: Secondary | ICD-10-CM | POA: Diagnosis not present

## 2020-11-14 ENCOUNTER — Other Ambulatory Visit: Payer: Self-pay

## 2020-11-14 ENCOUNTER — Ambulatory Visit (INDEPENDENT_AMBULATORY_CARE_PROVIDER_SITE_OTHER): Payer: Medicaid Other

## 2020-11-14 VITALS — BP 110/71 | HR 75 | Wt 157.0 lb

## 2020-11-14 DIAGNOSIS — N939 Abnormal uterine and vaginal bleeding, unspecified: Secondary | ICD-10-CM

## 2020-11-14 NOTE — Progress Notes (Signed)
Attestation of Attending Supervision of clinical support staff: I agree with the care provided to this patient and was available for any consultation.  I have reviewed the RN's note and chart. I was available for consult and to see the patient if needed.   Miran Kautzman Niles Darriana Deboy, MD, MPH, ABFM Attending Physician Faculty Practice- [facility]  

## 2020-11-14 NOTE — Progress Notes (Signed)
Pt showed up at office today for having concerns for vaginal bleeding since having IUD placed on 11/03/20. Pt states changing a pad 5-6 times a day x 1 day. States lighter today. Pt states not seeing IUD strings out of vagina or having any cramps or pain, no clots. Pt states has not been sexually active since delivery.   Pt advised this bleeding is normal for up to 3 months after IUD placement. Pt advised if has heavy bleeding, large clots,  feeling lightheaded or dizzy, or IUD comes out, can call office during business hours or can go to ER. Pt verbalized understanding.   Pt has IUD check on 6/29 at 930 am. Pt aware and agreeable.   Judeth Cornfield, RN

## 2020-11-16 ENCOUNTER — Ambulatory Visit: Payer: Medicaid Other | Admitting: Obstetrics and Gynecology

## 2020-12-07 ENCOUNTER — Other Ambulatory Visit: Payer: Self-pay

## 2020-12-07 ENCOUNTER — Other Ambulatory Visit (HOSPITAL_COMMUNITY)
Admission: RE | Admit: 2020-12-07 | Discharge: 2020-12-07 | Disposition: A | Discharge status: Home / Self Care | Payer: Medicaid Other | Source: Ambulatory Visit | Attending: Certified Nurse Midwife | Admitting: Certified Nurse Midwife

## 2020-12-07 ENCOUNTER — Encounter: Payer: Self-pay | Admitting: Certified Nurse Midwife

## 2020-12-07 ENCOUNTER — Ambulatory Visit (INDEPENDENT_AMBULATORY_CARE_PROVIDER_SITE_OTHER): Payer: Medicaid Other | Admitting: Certified Nurse Midwife

## 2020-12-07 VITALS — BP 90/47 | HR 74 | Wt 160.9 lb

## 2020-12-07 DIAGNOSIS — N898 Other specified noninflammatory disorders of vagina: Secondary | ICD-10-CM

## 2020-12-07 DIAGNOSIS — Z975 Presence of (intrauterine) contraceptive device: Secondary | ICD-10-CM | POA: Diagnosis not present

## 2020-12-07 DIAGNOSIS — R5383 Other fatigue: Secondary | ICD-10-CM

## 2020-12-07 NOTE — Progress Notes (Signed)
Pt c/o tired and exhausted. Pt states has brown discharge, but stopped 1 week ago. Denies any itching or irritation but has odor.

## 2020-12-08 ENCOUNTER — Ambulatory Visit: Payer: Medicaid Other | Attending: Advanced Practice Midwife | Admitting: Physical Therapy

## 2020-12-08 LAB — CERVICOVAGINAL ANCILLARY ONLY
Bacterial Vaginitis (gardnerella): NEGATIVE
Candida Glabrata: NEGATIVE
Candida Vaginitis: NEGATIVE
Chlamydia: NEGATIVE
Comment: NEGATIVE
Comment: NEGATIVE
Comment: NEGATIVE
Comment: NEGATIVE
Comment: NEGATIVE
Comment: NORMAL
Neisseria Gonorrhea: NEGATIVE
Trichomonas: NEGATIVE

## 2020-12-08 LAB — CBC WITH DIFFERENTIAL/PLATELET
Basophils Absolute: 0 10*3/uL (ref 0.0–0.2)
Basos: 1 %
EOS (ABSOLUTE): 0.1 10*3/uL (ref 0.0–0.4)
Eos: 3 %
Hematocrit: 37.3 % (ref 34.0–46.6)
Hemoglobin: 11.8 g/dL (ref 11.1–15.9)
Immature Grans (Abs): 0 10*3/uL (ref 0.0–0.1)
Immature Granulocytes: 0 %
Lymphocytes Absolute: 1.4 10*3/uL (ref 0.7–3.1)
Lymphs: 34 %
MCH: 25.5 pg — ABNORMAL LOW (ref 26.6–33.0)
MCHC: 31.6 g/dL (ref 31.5–35.7)
MCV: 81 fL (ref 79–97)
Monocytes Absolute: 0.3 10*3/uL (ref 0.1–0.9)
Monocytes: 8 %
Neutrophils Absolute: 2.2 10*3/uL (ref 1.4–7.0)
Neutrophils: 54 %
Platelets: 253 10*3/uL (ref 150–450)
RBC: 4.63 x10E6/uL (ref 3.77–5.28)
RDW: 16.1 % — ABNORMAL HIGH (ref 11.7–15.4)
WBC: 4.1 10*3/uL (ref 3.4–10.8)

## 2020-12-08 LAB — THYROID PANEL WITH TSH
Free Thyroxine Index: 1.7 (ref 1.2–4.9)
T3 Uptake Ratio: 24 % (ref 24–39)
T4, Total: 7.2 ug/dL (ref 4.5–12.0)
TSH: 1.05 u[IU]/mL (ref 0.450–4.500)

## 2020-12-08 NOTE — Progress Notes (Signed)
   Subjective:   Patient Name: Diana Hopkins, female   DOB: 07-Oct-1995, 25 y.o.  MRN: [id number]  HPI Patient here for an IUD check.  She had the Liletta IUD placed 1 month ago.  She reports brown discharge until a week ago, still having some discharge with odor and vaginal discomfort. Reports IC still hurts a little. Denies pelvic pain/cramping, bleeding or urinary symptoms. Does report feeling exhausted all the time and almost constant pain in her lower legs. Asks if this is related to her "rheumatism" diagnosed by doctors in [place]. Does not eat very much "it all goes to the baby" and has to watch an additional 7 children on the weekends (her BiL's wife recently died and he brings the kids to her house on the weekends).  Pertinent items noted in HPI and remainder of comprehensive ROS otherwise negative.  Live interpreter (Mahin) present throughout visit for interpretation services.     Objective:   Physical Exam  Constitutional: She appears well-developed and well-nourished albeit tired. All 3 kids present, older two somewhat disruptive. HENT:  Head: Normocephalic and atraumatic.  Abdominal: Soft. There is no tenderness. There is no guarding.  Genitourinary: There is no rash, tenderness or lesion on the right labia. There is no rash, tenderness or lesion on the left labia. No erythema or tenderness in the vagina. No foreign body around the vagina. No signs of injury around the vagina. No vaginal discharge found.    Skin: Skin is warm and dry.  Psychiatric: She has a normal mood and affect. Her behavior is normal. Judgment and thought content normal.  Assessment & Plan:  1. IUD (intrauterine device) in place IUD in place.  Pt to call with any other problems.  Recheck in 1 year.  2. Vaginal discharge - Cervicovaginal ancillary only( [place])  3. Other fatigue - CBC w/Diff - Thyroid Panel With TSH - Ambulatory referral to Family Practice  Follow up in one year for annual  exam or PRN.  Bernerd Limbo, [place] 12/08/2020 7:39 PM

## 2020-12-09 DIAGNOSIS — Z419 Encounter for procedure for purposes other than remedying health state, unspecified: Secondary | ICD-10-CM | POA: Diagnosis not present

## 2020-12-28 ENCOUNTER — Telehealth: Payer: Self-pay

## 2020-12-28 DIAGNOSIS — Z09 Encounter for follow-up examination after completed treatment for conditions other than malignant neoplasm: Secondary | ICD-10-CM

## 2020-12-28 NOTE — Telephone Encounter (Signed)
SWCM received return phone call from  [facility] at [facility]. Will follow up with Dr. Manson Passey in refugee clinic to see if they can contact health dept, go through process, and connect with pt.    Kenn File, BSW, QP Case Manager [facility] Office: [phone] Direct Number: [phone]

## 2020-12-28 NOTE — Telephone Encounter (Signed)
SWCM called [facility], due to mother reporting that she has not heard anything and needed assistance getting connected. Left message for referral coordinator.    Kenn File, BSW, QP Case Manager [facility] Office: [phone] Direct Number: [phone]

## 2021-01-09 DIAGNOSIS — Z419 Encounter for procedure for purposes other than remedying health state, unspecified: Secondary | ICD-10-CM | POA: Diagnosis not present

## 2021-02-07 ENCOUNTER — Other Ambulatory Visit: Payer: Self-pay

## 2021-02-07 ENCOUNTER — Ambulatory Visit (INDEPENDENT_AMBULATORY_CARE_PROVIDER_SITE_OTHER): Payer: Medicaid Other | Admitting: Family Medicine

## 2021-02-07 VITALS — BP 98/60 | HR 78 | Ht 62.0 in | Wt 163.2 lb

## 2021-02-07 DIAGNOSIS — R7612 Nonspecific reaction to cell mediated immunity measurement of gamma interferon antigen response without active tuberculosis: Secondary | ICD-10-CM | POA: Diagnosis not present

## 2021-02-07 DIAGNOSIS — Z8719 Personal history of other diseases of the digestive system: Secondary | ICD-10-CM

## 2021-02-07 DIAGNOSIS — Z0289 Encounter for other administrative examinations: Secondary | ICD-10-CM | POA: Diagnosis not present

## 2021-02-07 DIAGNOSIS — Z8759 Personal history of other complications of pregnancy, childbirth and the puerperium: Secondary | ICD-10-CM

## 2021-02-07 DIAGNOSIS — R519 Headache, unspecified: Secondary | ICD-10-CM

## 2021-02-07 DIAGNOSIS — H93A2 Pulsatile tinnitus, left ear: Secondary | ICD-10-CM | POA: Diagnosis not present

## 2021-02-07 MED ORDER — IBUPROFEN 600 MG PO TABS: 600.0000 mg | ORAL_TABLET | Freq: Three times a day (TID) | ORAL | 0 refills | Status: DC | PRN

## 2021-02-07 NOTE — Assessment & Plan Note (Signed)
>>  ASSESSMENT AND PLAN FOR SEVERE FRONTAL HEADACHES WRITTEN ON 02/07/2021 11:01 AM BY PRAY, MARGARET E, MD  - for past two months, used to help with Tylenol  but no longer helping - does describe pulsatile tinnitus with these headaches, not outside of headaches, and some blurred vision - normal neuro exam today, reassuring, however will need MRI/MRA due to new onset pulsatile headaches with tinnitus - passed hearing screen today after ear wax irrigated from left ear - strict ED precautions discussed including numbness, weakness,

## 2021-02-07 NOTE — Assessment & Plan Note (Addendum)
-   for past two months, used to help with Tylenol but no longer helping - does describe pulsatile tinnitus with these headaches, not outside of headaches, and some blurred vision - normal neuro exam today, reassuring, however will need MRI/MRA due to new onset pulsatile headaches with tinnitus - passed hearing screen today after ear wax irrigated from left ear - strict ED precautions discussed including numbness, weakness,

## 2021-02-07 NOTE — Progress Notes (Signed)
Patient Name: Diana Hopkins Date of Birth: 08-Dec-1995 Date of Visit: 02/07/21 PCP: Maury Dus, MD  Chief Complaint: refugee intake examination and transition of care to [facility] from [facility]  The patient's preferred language is [place]. An interpreter was used for the entire visit.  Interpreter Name or ID: Farsi, Mahih    Subjective: Diana Hopkins is a pleasant 25 y.o. presenting today for an initial refugee and immigrant clinic visit.    ROS: + depressed mood, denies SI, + headaches, no nausea or vomiting, + constipation, + decreased hearing on left, + pulsatile left sided unilateral headache, + photophobia, + phonophobia, + blurred vision with headaches x2 month, no numbness or weakness, no syncope, no chest pain or shortness of breath, no abdominal pain  PMH: History of R arm injury from fall and 5 surgeries to repair ? 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 Denies  In the past two weeks, have you had difficulty with obtaining food for your family? Denies  Refugee Information Number of Immediate Family Members: 5 Number of Immediate Family Members in [place]: 5 Date of Arrival: 03/30/20 Country of Birth: [place] Country of Origin: [place] Duration in [place]: 0-1 years Reason for Leaving Home Country: [place] Language: Other, Dari Other Primary Language:: Pastho Able to Read in Primary Language: Yes Able to Write in Primary Language: Yes Education: [employer] Prior Work: none Marital  Status: Married Sexual Activity: Yes Tuberculosis Screening Overseas: Negative Tuberculosis Screening Health Department: Not Completed Health Department Labs Completed: No History of Trauma: None Do You Feel Jumpy or Nervous?: Yes Are You Very Watchful or 'Super Alert'?: No    Vitals:   02/07/21 0847  BP: 98/60  Pulse: 78  SpO2: 98%   HEENT: Sclera anicteric. Dentition is fair . Appears well hydrated. Right TM clear, left TM unable to be visualized due to wax, but after irrigation no mass or lesions seen, TM clear without erythema or fluid. Neck: Supple Cardiac: Regular rate and rhythm. Normal S1/S2. No murmurs, rubs, or gallops appreciated. Lungs: Clear bilaterally to ascultation.  Abdomen: Normoactive bowel sounds. No tenderness to deep or light palpation. No rebound or guarding. Splenomegaly not appreciated on exam  Extremities: Warm, well perfused without edema.  Skin: no rashes, scars appreciated over right elbow and forearm  Psych: Pleasant and appropriate  MSK: decreased strength 4/5 in right upper extremity including decreased grip strength (patient states is chronic from her previous injury),  strength 5/5 in left upper extremity and bilateral lower extremities, CN II-XII intact, patellar DTRs 2+ bilaterally  Passed hearing screen.  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    Designated Market researcher signed with agency .   Release of information signed for Health Department .   Return to care in 1 month in [facility] with resident physician and PCP Dr Anner Crete and follow up in refugee clinic in 2 weeks to discuss multiple other issues.   Vaccines: updated in Epic

## 2021-02-07 NOTE — Assessment & Plan Note (Signed)
-   per chart review but cannot find test result, she has been pregnant and has not received treatment, will repeat today

## 2021-02-07 NOTE — Patient Instructions (Addendum)
It was wonderful to see you today.  Please bring ALL of your medications with you to every visit.   Today we talked about:  - We will schedule to get a picture of your head and will call you with date and time of this  - ibuprofen 600mg  TID has been sent to your pharmacy - We discsused reasons to go to ED - lab work done today    Thank you for choosing [facility].   Please call [phone] with any questions about today's appointment.  Please be sure to schedule follow up at the front  desk before you leave today.   [phone], MD  Family Medicine

## 2021-02-07 NOTE — Assessment & Plan Note (Signed)
-   with most recent pregnancy, will repeat LFTs and hepatitis serologies today

## 2021-02-07 NOTE — Assessment & Plan Note (Signed)
-   lab work per [employer] guidelines - no records available for review today

## 2021-02-09 DIAGNOSIS — Z419 Encounter for procedure for purposes other than remedying health state, unspecified: Secondary | ICD-10-CM | POA: Diagnosis not present

## 2021-02-12 LAB — VARICELLA ZOSTER ANTIBODY, IGG: Varicella zoster IgG: 2133 index (ref >165)

## 2021-02-12 LAB — COMPREHENSIVE METABOLIC PANEL
ALT: 34 IU/L — ABNORMAL HIGH (ref 0–32)
AST: 21 IU/L (ref 0–40)
Albumin/Globulin Ratio: 1.7 (ref 1.2–2.2)
Albumin: 4.7 g/dL (ref 3.9–5.0)
Alkaline Phosphatase: 143 IU/L — ABNORMAL HIGH (ref 44–121)
BUN/Creatinine Ratio: 25 — ABNORMAL HIGH (ref 9–23)
BUN: 14 mg/dL (ref 6–20)
Bilirubin Total: 0.3 mg/dL (ref 0.0–1.2)
CO2: 24 mmol/L (ref 20–29)
Calcium: 9.8 mg/dL (ref 8.7–10.2)
Chloride: 102 mmol/L (ref 96–106)
Creatinine, Ser: 0.56 mg/dL — ABNORMAL LOW (ref 0.57–1.00)
Globulin, Total: 2.7 g/dL (ref 1.5–4.5)
Glucose: 81 mg/dL (ref 65–99)
Potassium: 4.5 mmol/L (ref 3.5–5.2)
Sodium: 139 mmol/L (ref 134–144)
Total Protein: 7.4 g/dL (ref 6.0–8.5)
eGFR: 130 mL/min/{1.73_m2} (ref >59)

## 2021-02-12 LAB — LIPID PANEL
Chol/HDL Ratio: 3.7 ratio (ref 0.0–4.4)
Cholesterol, Total: 157 mg/dL (ref 100–199)
HDL: 42 mg/dL (ref >39)
LDL Chol Calc (NIH): 90 mg/dL (ref 0–99)
Triglycerides: 139 mg/dL (ref 0–149)
VLDL Cholesterol Cal: 25 mg/dL (ref 5–40)

## 2021-02-12 LAB — QUANTIFERON-TB GOLD PLUS
QuantiFERON Mitogen Value: >10 IU/mL
QuantiFERON Nil Value: 0.02 IU/mL
QuantiFERON TB1 Ag Value: 0.02 IU/mL
QuantiFERON TB2 Ag Value: 0.01 IU/mL
QuantiFERON-TB Gold Plus: NEGATIVE

## 2021-02-12 LAB — HCV AB W REFLEX TO QUANT PCR: HCV Ab: <0.1 s/co ratio (ref 0.0–0.9)

## 2021-02-12 LAB — HEPATITIS B SURFACE ANTIGEN: Hepatitis B Surface Ag: NEGATIVE

## 2021-02-12 LAB — HCV INTERPRETATION

## 2021-02-12 LAB — HEPATITIS B CORE ANTIBODY, TOTAL: Hep B Core Total Ab: NEGATIVE

## 2021-02-12 LAB — HEPATITIS B SURFACE ANTIBODY, QUANTITATIVE: Hepatitis B Surf Ab Quant: 4 m[IU]/mL — ABNORMAL LOW (ref >9.9)

## 2021-02-20 ENCOUNTER — Telehealth: Payer: Self-pay

## 2021-02-20 NOTE — Telephone Encounter (Signed)
Spoke with patient through interpreter id# [id number]. Informed patient of her appt at [facility] for MRI on Thur Sep. 22nd at 3:30. Patient took down information as well as address to Hospital .Aquilla Solian, CMA

## 2021-02-21 ENCOUNTER — Other Ambulatory Visit: Payer: Self-pay

## 2021-02-21 ENCOUNTER — Ambulatory Visit (INDEPENDENT_AMBULATORY_CARE_PROVIDER_SITE_OTHER): Payer: Medicaid Other | Admitting: Family Medicine

## 2021-02-21 VITALS — BP 107/65 | HR 70 | Ht 62.0 in | Wt 166.6 lb

## 2021-02-21 DIAGNOSIS — R748 Abnormal levels of other serum enzymes: Secondary | ICD-10-CM | POA: Diagnosis not present

## 2021-02-21 DIAGNOSIS — R4589 Other symptoms and signs involving emotional state: Secondary | ICD-10-CM | POA: Diagnosis not present

## 2021-02-21 MED ORDER — SERTRALINE HCL 25 MG PO TABS: 25.0000 mg | ORAL_TABLET | Freq: Every day | ORAL | 1 refills | Status: DC

## 2021-02-21 NOTE — Progress Notes (Signed)
Patient Name: Jewel Mcafee Date of Birth: 1995-10-03 Date of Visit: 02/21/21 PCP: Alcus Dad, MD  Chief Complaint: follow up of headaches  The patient's preferred language is Farsi. An interpreter was used for the entire visit.  Interpreter Name or ID:    Subjective: Makynna Manocchio is a pleasant 25 y.o. presenting today for follow up of acute intractable headaches. Patient saw Dr. Thompson Grayer on 8/30 with complaints of morning headaches that were most consistent with tension headache. Patient was prescribed ibuprofen , which she states has been helping relieving the symptom short-term but does not prevent it from occurring the next morning. Headaches localize to sides of forehead with similar nature to past. Patient is dealing with a lot of psychosocial factors, including taking care of her 39 month-old daughter and two older children at age 65 and 50. She is constantly worried about her parents who still remain in [place]. Her husband is working long hours and she does not have a lot of support from the [place] who live in the same apartment complex.   States that she only has 4 hours of sleep at night. She has never tried any sleep aid before. Is amenable to starting melatonin.   States that she has had difficult regulate her emotions, particularly feeling angry with her children. When this happens, her fingers feel tingling and numb. Denies any suicidal thoughts or thoughts of harming others. Denies ever having episodes of mania (staying up several nights without feeling exhausted, elevated mood, impulsive behaviors). Discussed with patient regarding starting her on Zoloft at 25 mg for 1 week and titrate to $RemoveBe'50mg'oaNueIhOa$  for the next week. Patient expresses understanding and agreeable to plan. Denies SI/HI.    ROS:  Review of Systems  Constitutional:  Positive for malaise/fatigue. Negative for chills, fever and weight loss.  HENT: Negative.    Eyes: Negative.   Respiratory: Negative.     Cardiovascular: Negative.   Gastrointestinal: Negative.   Genitourinary: Negative.   Musculoskeletal: Negative.   Skin: Negative.   Neurological:  Positive for headaches. Negative for dizziness.  Endo/Heme/Allergies: Negative.   Psychiatric/Behavioral:  Positive for depression. Negative for substance abuse and suicidal ideas. The patient is nervous/anxious. The patient does not have insomnia.     PMH: History of R arm injury from fall and 5 surgeries to repair ? 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 none  In the past two weeks, have you had difficulty with obtaining food for your family? none  Vitals:   02/21/21 1032  BP: 107/65  Pulse: 70  SpO2: 98%   HEENT: Sclera anicteric. Appears well hydrated. Neck: Supple Cardiac: Regular rate and rhythm. Normal S1/S2. No murmurs, rubs, or gallops appreciated. Lungs: Clear bilaterally to ascultation.  Abdomen: Normoactive bowel sounds. No tenderness to deep or light palpation. No rebound or guarding. No hepatosplenomegaly Extremities: Warm, well perfused without edema.  Skin: no visible rashes Psych: Anxious and depressed mood, though smiled once in a while. Appropriate affect.  MSK: No joint pain or effusion.    No problem-specific Assessment & Plan notes found for this encounter.  Deniss is a 25 year-old [place] refugee who presents to [facility] today for follow up of headache.  Frontal headache - Relieved with ibuprofen. Reminded patient that it will not cure  her headache, and discussed behavioral modifications including increasing sleep and decrease caffeine intake.  - Prescribe melatonin 52m OTC to assist with sleep and counseled her to take 2 hours before bedtime  - MRA neck and brain scheduled 03/02/21 due to present tinnitus and pulsatile nature of headache, given date/time of appointment today  Anxiety  - Patient has multiple psychosocial factors contributing to anxiety symptoms of feeling on edge, anger, and quick temper.  - Discussed in length with patient coping mechanisms include making small changes, journaling, breath exercise and finding ways to incorporate joy into daily living - Start Zoloft at 25 mg qAM x 1 week, then titrate to 50 mg next week if patient does not experience side effects including GI upset - Follow up in 3 weeks  Cholestasis of pregnancy - Obtain CMP and GGT today for persistant alk phos elevation and mild LFT elevations - [place]abdomen RUQ scheduled for 03/02/21   I was available as preceptor in clinic. I saw the patient and verified the history with the medical student. I agree with the assessment and plan as documented.  MYehuda Savannah MD

## 2021-02-21 NOTE — Patient Instructions (Addendum)
It was wonderful to see you today.  Please bring ALL of your medications with you to every visit.   Today we talked about:  - We have you scheduled for a follow up in a few weeks- we will do a pap smear this time - Your MRI is scheduled for March 02, 2021  - We will start you on Zoloft, 25 mg once daily for one week, then increase to 50 mg daily   Thank you for choosing [facility].   Please call [phone] with any questions about today's appointment.  Please be sure to schedule follow up at the front  desk before you leave today.   Burley Saver, MD  Family Medicine

## 2021-02-22 LAB — COMPREHENSIVE METABOLIC PANEL
ALT: 33 IU/L — ABNORMAL HIGH (ref 0–32)
AST: 22 IU/L (ref 0–40)
Albumin/Globulin Ratio: 1.6 (ref 1.2–2.2)
Albumin: 4.3 g/dL (ref 3.9–5.0)
Alkaline Phosphatase: 108 IU/L (ref 44–121)
BUN/Creatinine Ratio: 25 — ABNORMAL HIGH (ref 9–23)
BUN: 13 mg/dL (ref 6–20)
Bilirubin Total: 0.3 mg/dL (ref 0.0–1.2)
CO2: 21 mmol/L (ref 20–29)
Calcium: 9.1 mg/dL (ref 8.7–10.2)
Chloride: 103 mmol/L (ref 96–106)
Creatinine, Ser: 0.52 mg/dL — ABNORMAL LOW (ref 0.57–1.00)
Globulin, Total: 2.7 g/dL (ref 1.5–4.5)
Glucose: 83 mg/dL (ref 65–99)
Potassium: 4.1 mmol/L (ref 3.5–5.2)
Sodium: 138 mmol/L (ref 134–144)
Total Protein: 7 g/dL (ref 6.0–8.5)
eGFR: 132 mL/min/{1.73_m2} (ref >59)

## 2021-02-22 LAB — GAMMA GT: GGT: 9 IU/L (ref 0–60)

## 2021-02-24 ENCOUNTER — Telehealth: Payer: Self-pay | Admitting: Family Medicine

## 2021-02-24 ENCOUNTER — Other Ambulatory Visit: Payer: Self-pay | Admitting: Family Medicine

## 2021-02-24 DIAGNOSIS — R4589 Other symptoms and signs involving emotional state: Secondary | ICD-10-CM

## 2021-02-24 NOTE — Telephone Encounter (Signed)
Patients husband walked in and said they are having issues with his wifes prescription for Zoloft. He states he has been calling and that [facility] has tried to contact us. There is an issue filling due to medicaid.   I do not see anything documented. He would like for someone to call him asap to discuss medication [phone]  Please advise

## 2021-02-24 NOTE — Telephone Encounter (Signed)
Called pharmacy. Reports that patient picked up 60 tablets yesterday. Pharmacist does request that for next refill that medication be sent over for 50 mg tablets instead of 2, 25 mg tablets.   Diana Hopkins

## 2021-02-28 ENCOUNTER — Other Ambulatory Visit: Payer: Self-pay | Admitting: Family Medicine

## 2021-02-28 DIAGNOSIS — R519 Headache, unspecified: Secondary | ICD-10-CM

## 2021-03-02 ENCOUNTER — Ambulatory Visit (HOSPITAL_COMMUNITY)
Admission: RE | Admit: 2021-03-02 | Discharge: 2021-03-02 | Disposition: A | Discharge status: Home / Self Care | Payer: Medicaid Other | Source: Ambulatory Visit | Attending: Family Medicine | Admitting: Family Medicine

## 2021-03-02 ENCOUNTER — Other Ambulatory Visit: Payer: Self-pay

## 2021-03-02 DIAGNOSIS — R519 Headache, unspecified: Secondary | ICD-10-CM | POA: Diagnosis not present

## 2021-03-02 DIAGNOSIS — R748 Abnormal levels of other serum enzymes: Secondary | ICD-10-CM

## 2021-03-02 DIAGNOSIS — I614 Nontraumatic intracerebral hemorrhage in cerebellum: Secondary | ICD-10-CM | POA: Diagnosis not present

## 2021-03-02 DIAGNOSIS — H93A2 Pulsatile tinnitus, left ear: Secondary | ICD-10-CM | POA: Insufficient documentation

## 2021-03-02 IMAGING — MR MR MRA NECK WO/W CM
8 of 10 series · 44 of 48 positions shown · IV contrast (Contrast agent)
Comparison: None.

CLINICAL DATA: Initial evaluation for left-sided pulsatile
tinnitus, severe headaches.

EXAM:
MRA NECK WITHOUT AND WITH CONTRAST
TECHNIQUE: Multiplanar and multiecho pulse sequences of the neck were obtained
without and with intravenous contrast. Angiographic images of the
neck were obtained using MRA technique without and with intravenous
contrast.
CONTRAST:  7.5mL GADAVIST GADOBUTROL 1 MMOL/ML IV SOLN

[Series 27: tof_fl3d_tra_iso · axial · 0.6mm · 0.52mm/px · z∈[-200,-125]mm · 8 of 133 slices shown]
[im 1/133]
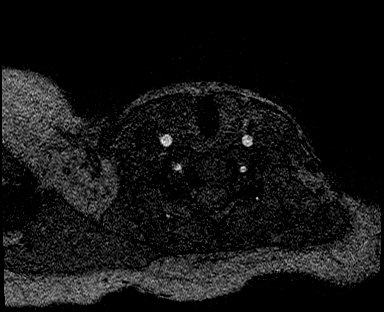
[im 15/133]
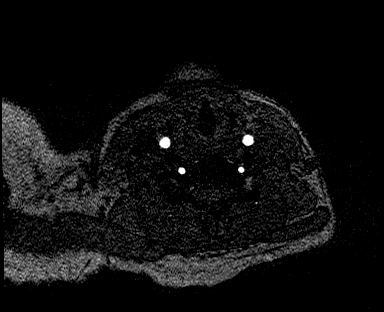
[im 45/133]
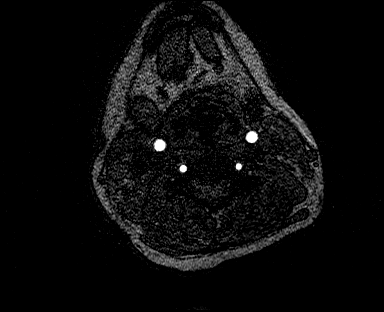
[im 59/133]
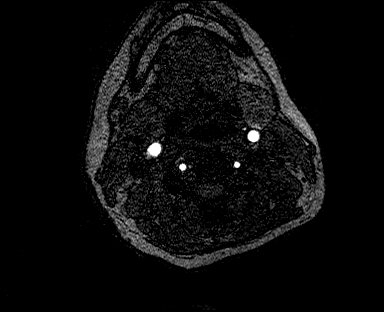
[im 74/133]
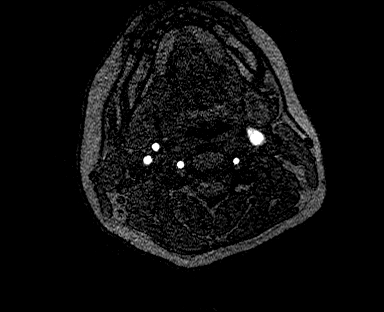
[im 89/133]
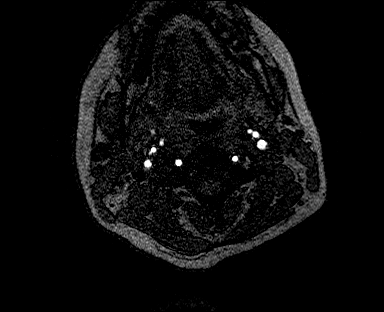
[im 118/133]
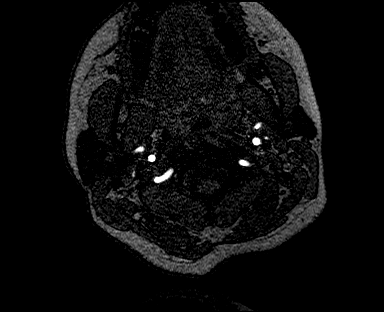
[im 133/133]
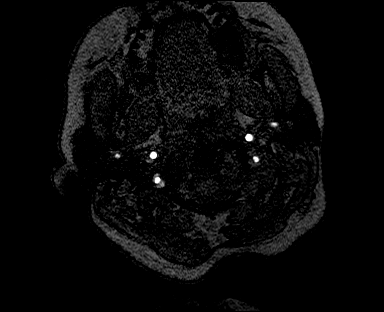

[Series 30: angio_fl3d_cor_pre_ttc=3.0s · coronal · 0.9mm · 0.85mm/px · 6 of 80 slices shown]
[im 1/80]
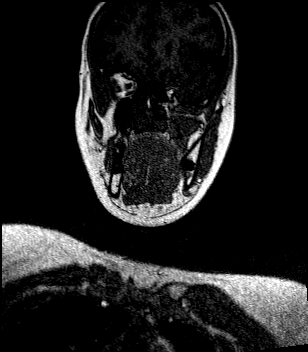
[im 16/80]
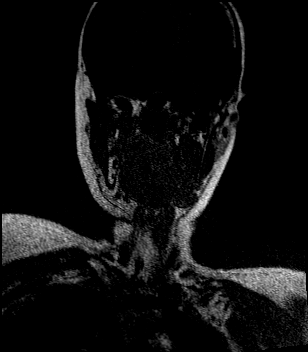
[im 32/80]
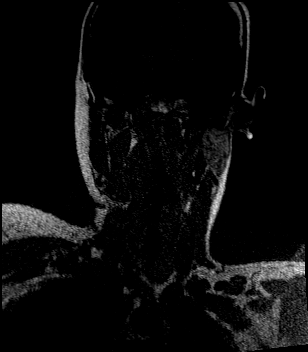
[im 48/80]
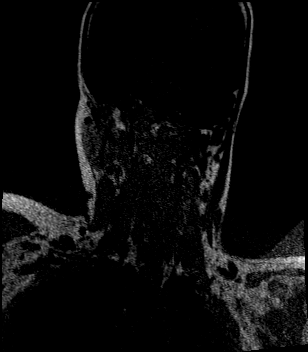
[im 64/80]
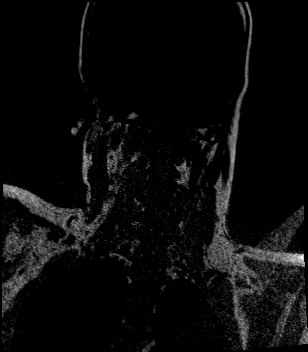
[im 80/80]
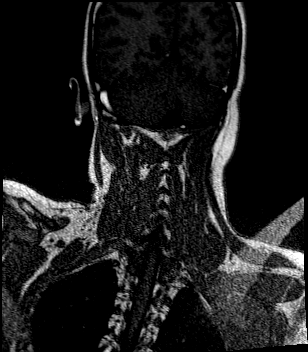

[Series 32: angio_fl3d_cor_post_ttc=3.0s · coronal · 0.9mm · 0.85mm/px · 5 of 80 slices shown (1 of 2)]
[im 1/80]
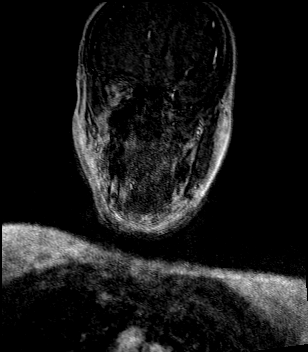
[im 20/80]
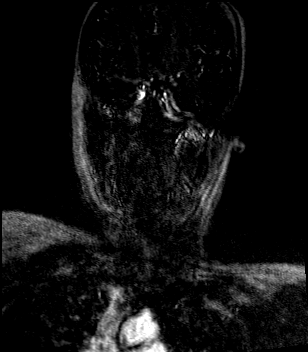
[im 40/80]
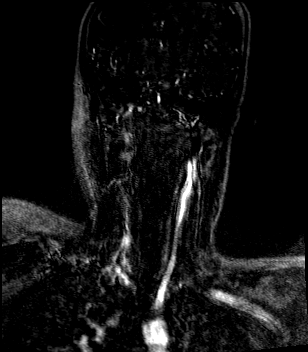
[im 60/80]
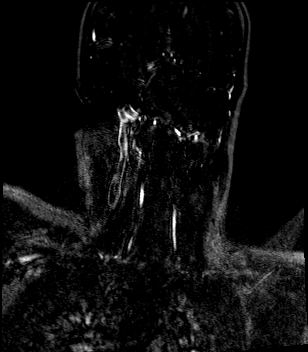
[im 80/80]
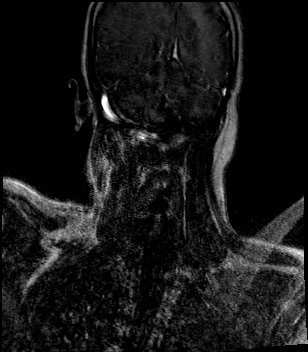

[Series 33: angio_fl3d_cor_post_ttc=3.0s_moco-adv · coronal · 0.9mm · 0.85mm/px · 5 of 80 slices shown (1 of 2)]
[im 1/80]
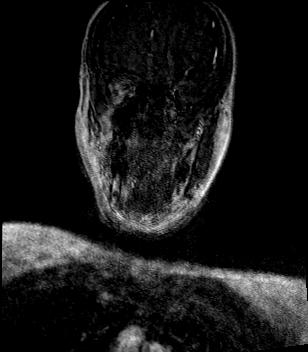
[im 20/80]
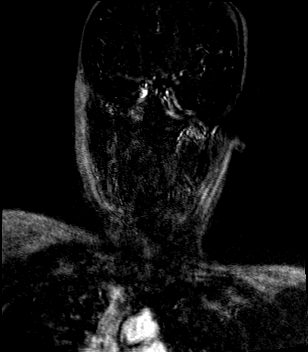
[im 40/80]
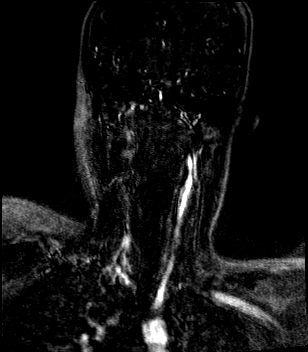
[im 60/80]
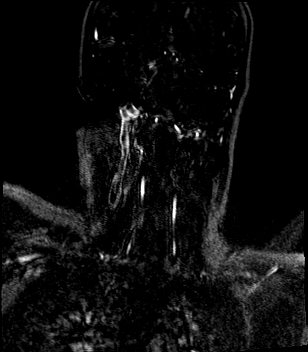
[im 80/80]
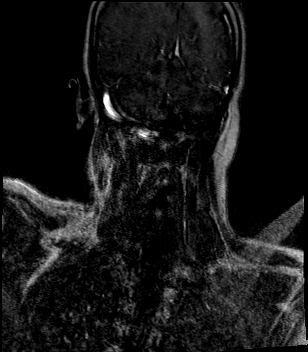

[Series 34: angio_fl3d_cor_post_ttc=3.0s_moco-adv_sub · coronal · 0.9mm · 0.85mm/px · 5 of 80 slices shown (1 of 2)]
[im 1/80]
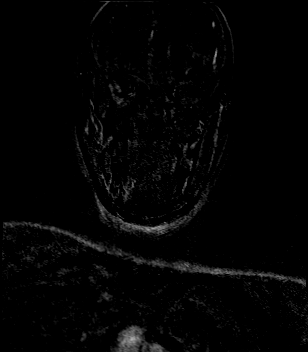
[im 20/80]
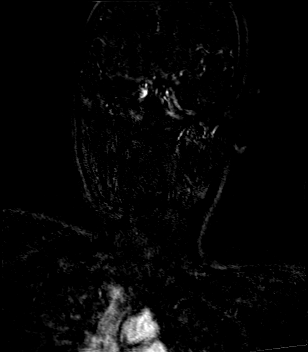
[im 40/80]
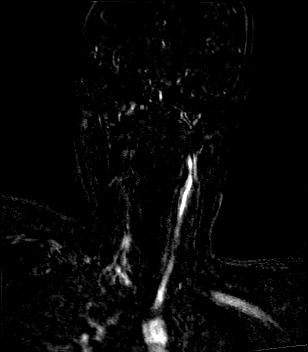
[im 60/80]
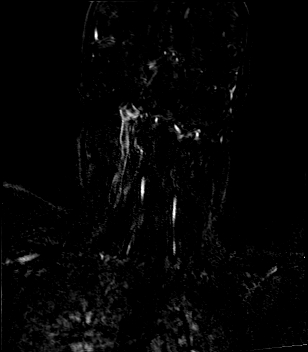
[im 80/80]
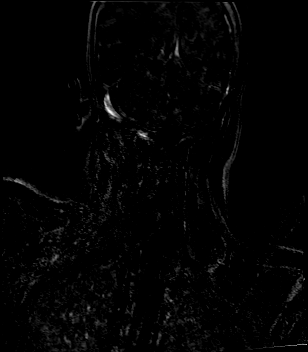

[Series 36: angio_fl3d_cor_post_ttc=3.0s · coronal · 0.9mm · 0.85mm/px · 5 of 80 slices shown (2 of 2)]
[im 1/80]
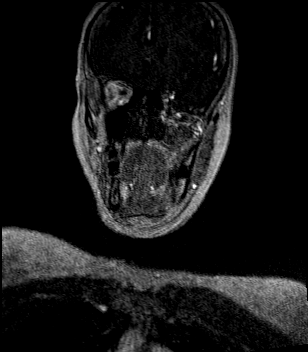
[im 20/80]
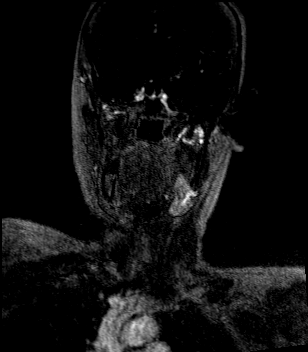
[im 40/80]
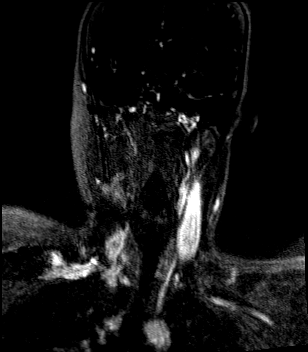
[im 60/80]
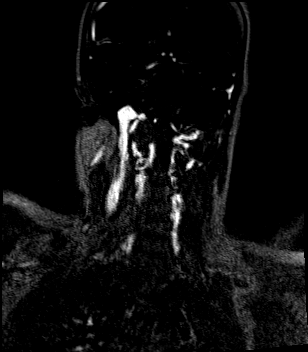
[im 80/80]
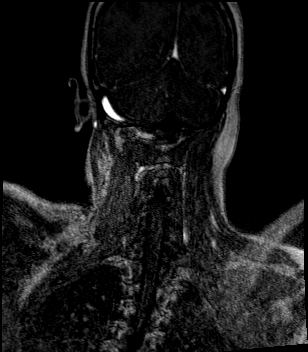

[Series 37: angio_fl3d_cor_post_ttc=3.0s_moco-adv · coronal · 0.9mm · 0.85mm/px · 5 of 80 slices shown (2 of 2)]
[im 1/80]
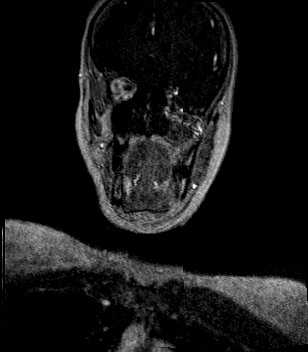
[im 20/80]
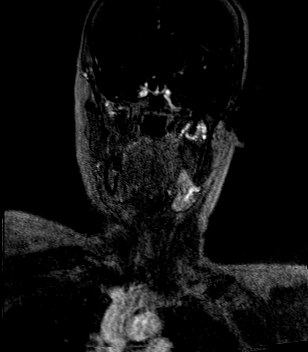
[im 40/80]
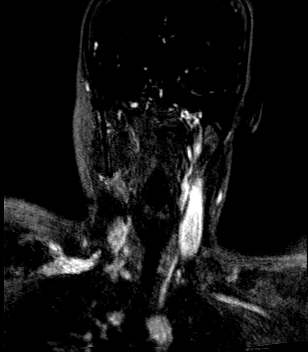
[im 60/80]
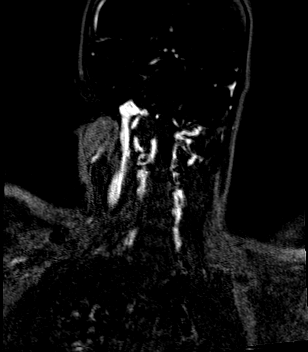
[im 80/80]
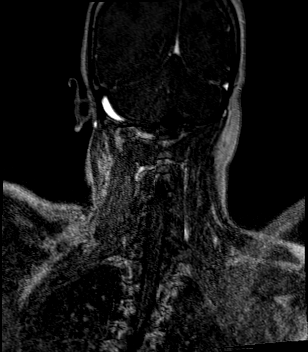

[Series 38: angio_fl3d_cor_post_ttc=3.0s_moco-adv_sub · coronal · 0.9mm · 0.85mm/px · 5 of 80 slices shown (2 of 2)]
[im 1/80]
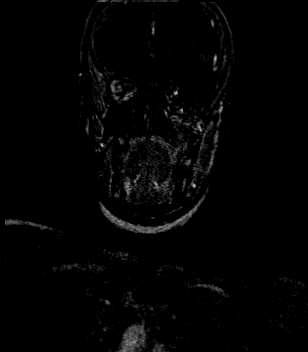
[im 20/80]
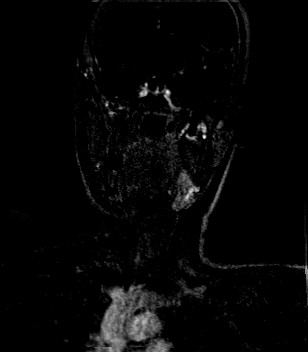
[im 40/80]
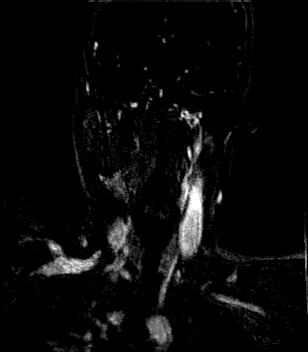
[im 60/80]
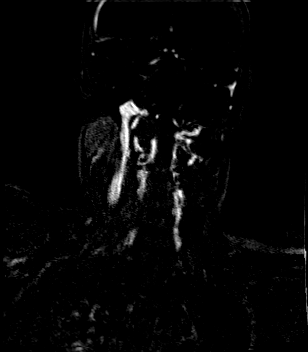
[im 80/80]
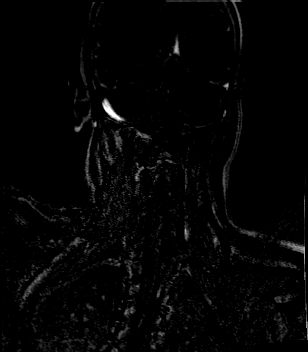

[44 of 48 positions shown; findings below may reference images not displayed]

FINDINGS: AORTIC ARCH: Examination moderately to severely degraded by motion
artifact.

Visualized aortic arch normal in caliber with normal 3 vessel
morphology. No hemodynamically significant stenosis or other
abnormality about the origin of the great vessels.

RIGHT CAROTID SYSTEM: Right common and internal carotid arteries are
grossly patent without visible stenosis, evidence for dissection, or
occlusion. No appreciable stenosis about the right carotid bulb.

LEFT CAROTID SYSTEM: Left common and internal carotid arteries are
grossly patent without visible stenosis, evidence for dissection or
occlusion. No obvious stenosis about the left carotid bulb.

VERTEBRAL ARTERIES: Both vertebral arteries arise from the
subclavian arteries. No proximal subclavian artery stenosis.
Vertebral arteries patent with antegrade flow, with no obvious
stenosis, evidence for dissection or occlusion.
IMPRESSION: 1. Motion degraded exam.
2. Grossly normal MRA of the neck. No findings to explain patient's
symptoms identified.

## 2021-03-02 IMAGING — MR MR BRAIN/IAC WO/W CM
15 of 20 series · 39 of 48 positions shown · IV contrast (gadavist)
Comparison: None available.

CLINICAL DATA: Initial evaluation for severe left-sided headache,
pulsatile tinnitus.

EXAM:
MRI HEAD WITHOUT AND WITH CONTRAST
TECHNIQUE: Multiplanar, multiecho pulse sequences of the brain and surrounding
structures were obtained without and with intravenous contrast. An
IAC protocol was utilized.
CONTRAST:  7.5mL GADAVIST GADOBUTROL 1 MMOL/ML IV SOLN

[Series 5: DWI · axial · 3.0mm · 0.88mm/px · z∈[-135,+4]mm · 5 of 102 slices shown (1 of 4)]
[im 1/102]
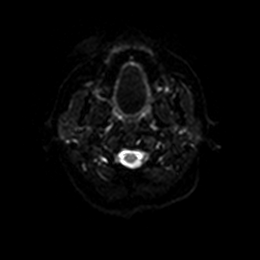
[im 26/102]
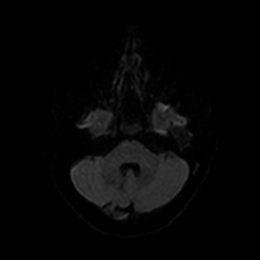
[im 51/102]
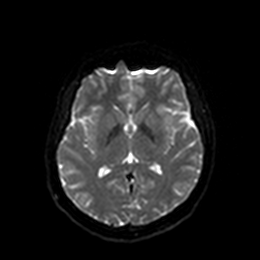
[im 76/102]
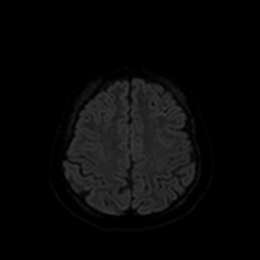
[im 102/102]
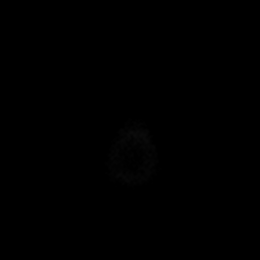

[Series 6: DWI · axial · 3.0mm · 0.88mm/px · z∈[-135,+4]mm · 2 of 51 slices shown (2 of 4)]
[im 1/51]
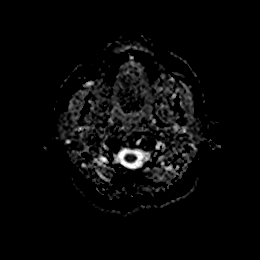
[im 51/51]
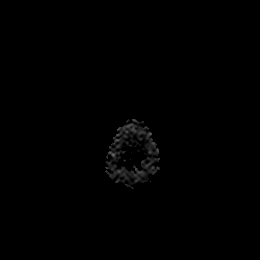

[Series 7: DWI · coronal · 4.0mm · 0.88mm/px · 4 of 64 slices shown (3 of 4)]
[im 1/64]
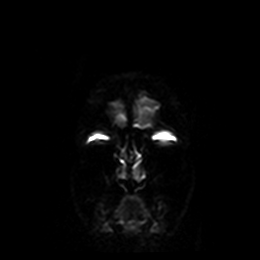
[im 22/64]
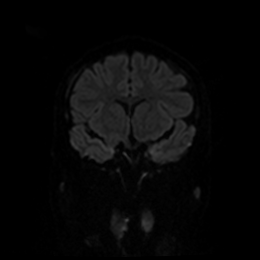
[im 43/64]
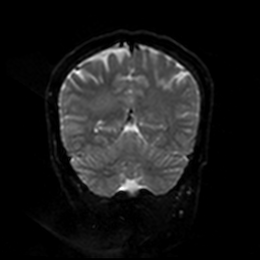
[im 64/64]
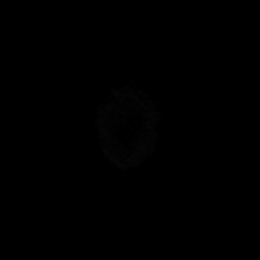

[Series 8: DWI · coronal · 4.0mm · 0.88mm/px · 2 of 32 slices shown (4 of 4)]
[im 1/32]
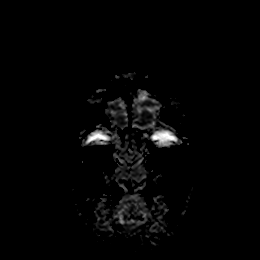
[im 32/32]
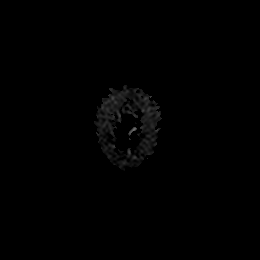

[Series 10: T2 · axial · 5.0mm · 0.72mm/px · z∈[-137,+7]mm · 2 of 27 slices shown (1 of 2)]
[im 1/27]
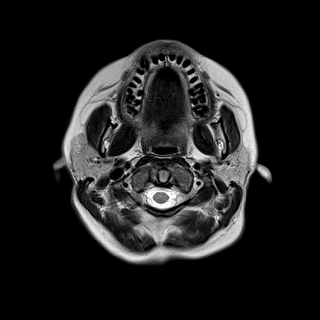
[im 27/27]
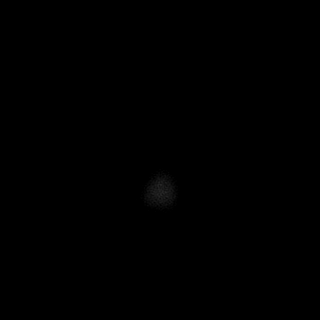

[Series 11: T2 · coronal · 5.0mm · 0.34mm/px · 2 of 29 slices shown (2 of 2)]
[im 1/29]
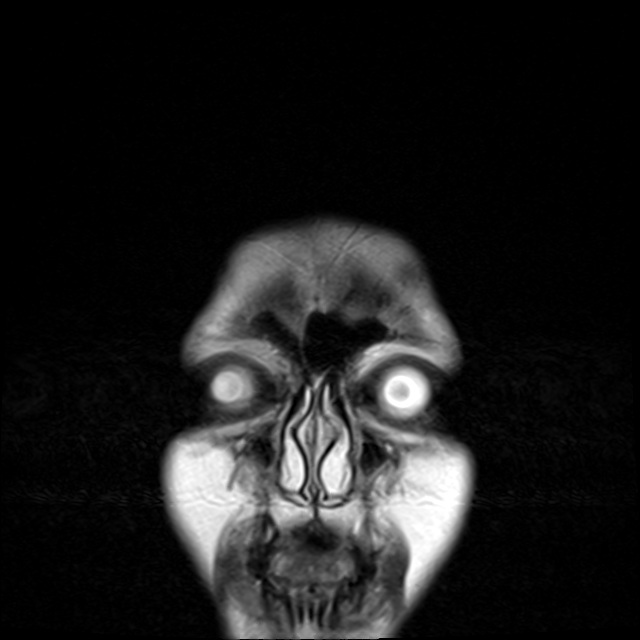
[im 29/29]
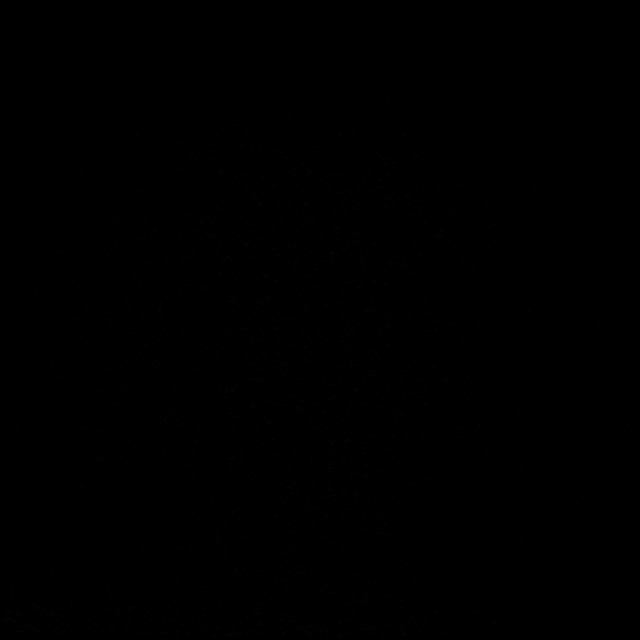

[Series 12: FLAIR · axial · 5.0mm · 0.45mm/px · z∈[-136,+9]mm · 2 of 27 slices shown]
[im 1/27]
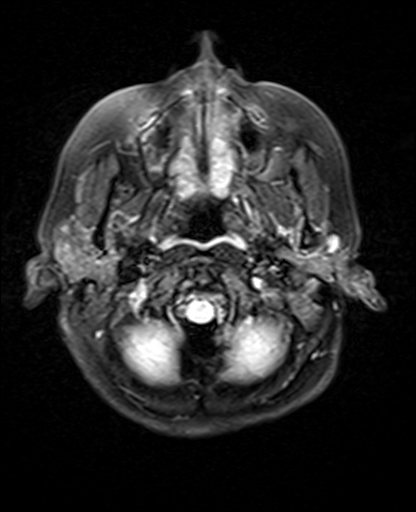
[im 27/27]
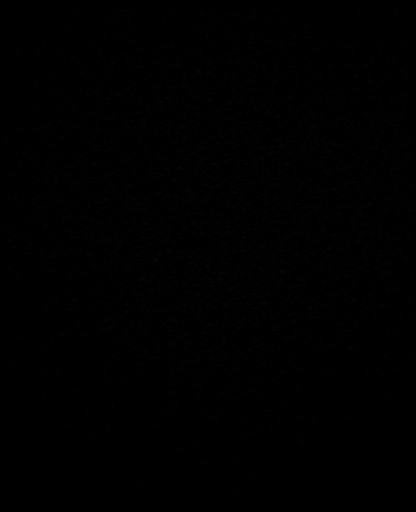

[Series 13: mag_images · axial · 3.0mm · 0.90mm/px · z∈[-140,+13]mm · 3 of 56 slices shown]
[im 1/56]
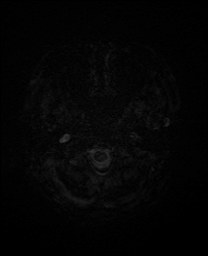
[im 28/56]
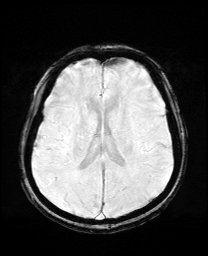
[im 56/56]
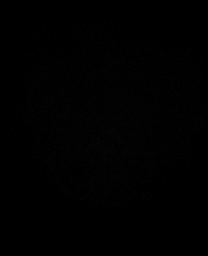

[Series 14: pha_images · axial · 3.0mm · 0.90mm/px · z∈[-140,+7]mm · 3 of 51 slices shown]
[im 1/51]
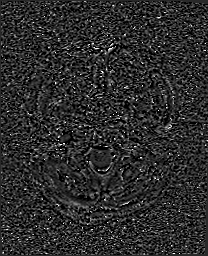
[im 26/51]
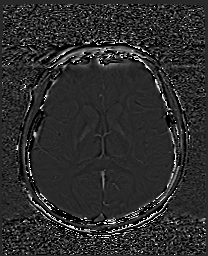
[im 51/51]
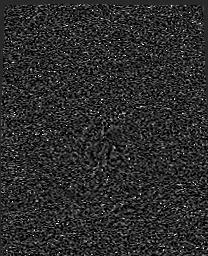

[Series 15: swi_images · axial · 3.0mm · 0.90mm/px · z∈[-140,+13]mm · 3 of 56 slices shown]
[im 1/56]
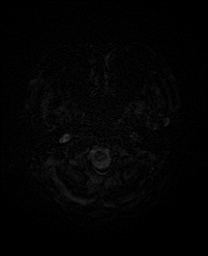
[im 28/56]
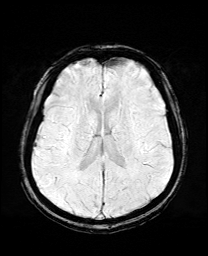
[im 56/56]
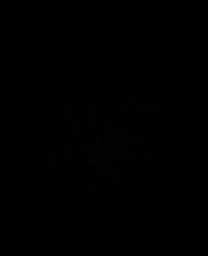

[Series 16: mip_images(sw) · axial · 24.0mm · 0.90mm/px · z∈[-130,+3]mm · 3 of 49 slices shown]
[im 1/49]
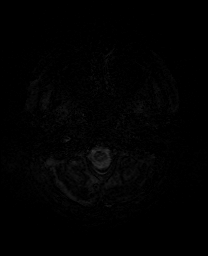
[im 25/49]
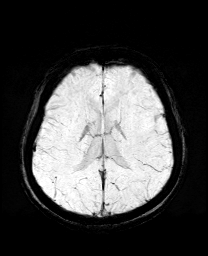
[im 49/49]
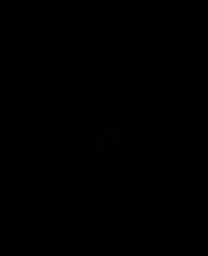

[Series 17: T1 · sagittal · 5.0mm · 0.75mm/px · 1 of 23 slices shown]
[im 1/23]
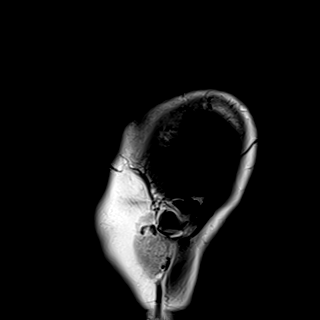

[Series 19: t2_(id)_tra_iso · axial · 0.6mm · 0.56mm/px · z∈[-128,-95]mm · 4 of 64 slices shown]
[im 1/64]
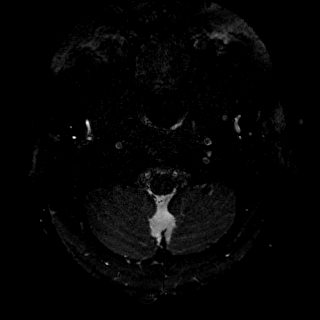
[im 22/64]
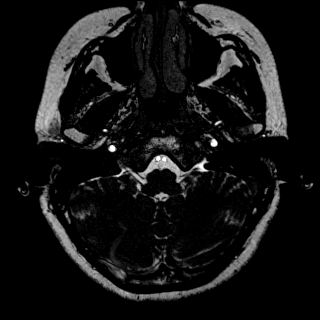
[im 43/64]
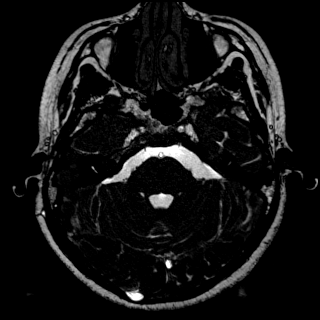
[im 64/64]
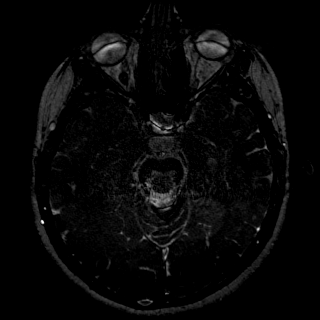

[Series 20: t1_tse_tra_3mm · axial · 3.0mm · 0.31mm/px · 1 of 15 slices shown]
[im 1/15]
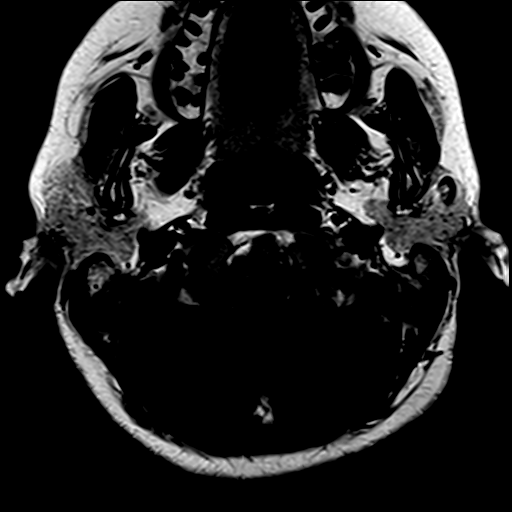

[Series 25: T1 post-contrast · coronal · 5.0mm · 0.34mm/px · 2 of 26 slices shown]
[im 1/26]
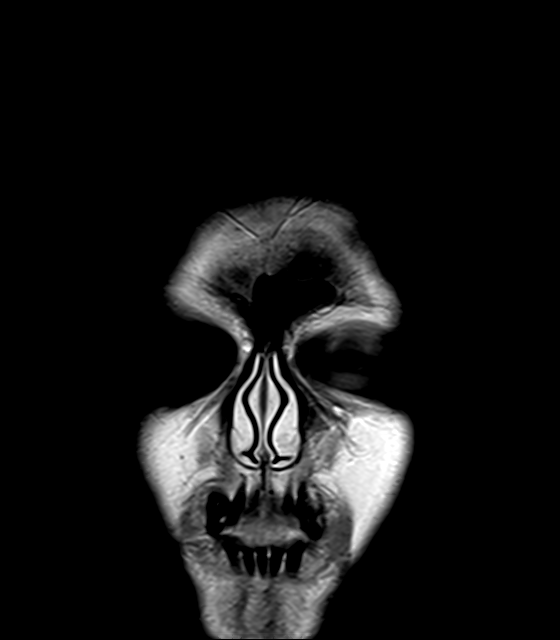
[im 26/26]
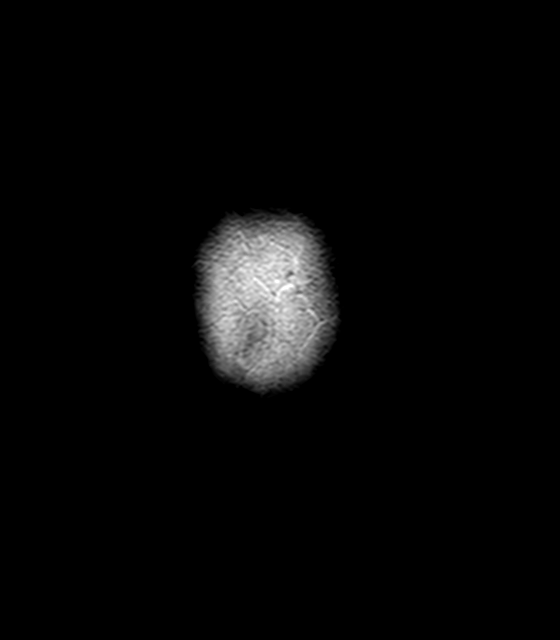

[39 of 48 positions shown; findings below may reference images not displayed]

FINDINGS: Brain: Cerebral volume within normal limits. No focal parenchymal
signal abnormality or significant cerebral white matter disease. No
abnormal foci of restricted diffusion to suggest acute or subacute
ischemia. Gray-white matter differentiation maintained. No
encephalomalacia to suggest chronic cortical infarction. No acute
intracranial hemorrhage.

Prominent DVA present at the right cerebellar hemisphere (series 24,
image 15). 7 mm focus of susceptibility artifact in this region
consistent with an associated chronic microhemorrhage (series 15,
image 8). No definite visible cavernoma.

No other mass lesion, mass effect, or midline shift. No
hydrocephalus or extra-axial fluid collection. Pituitary gland and
suprasellar region within normal limits. Midline structures intact.
No other abnormal enhancement within the brain itself.

Thin section imaging through the internal auditory canals was
performed. Seventh and eighth cranial nerves are seen coursing
normally through the cerebellopontine angle cisterns into the
internal auditory canals. No CPA angle mass. No intracanalicular
mass or abnormal enhancement. Inner ear structures including the
vestibulae, cochlea, and semi circular canals are normal. Normal
post ganglionic enhancement seen within the seventh cranial nerves
bilaterally. No significant mastoid effusion.

Vascular: Major intracranial vascular flow voids are maintained.

Skull and upper cervical spine: Craniocervical junction within
normal limits. Bone marrow signal intensity normal. No scalp soft
tissue abnormality.

Sinuses/Orbits: Globes and orbital soft tissues within normal
limits. Paranasal sinuses are clear.

Other: None.
IMPRESSION: 1. Prominent DVA involving the right cerebellar hemisphere with
associated 7 mm chronic microhemorrhage. This finding is likely
incidental in nature, and felt to be unlikely to be causative of
patient's symptoms.
2. Otherwise unremarkable and normal IAC protocol MRI of the brain.

## 2021-03-02 IMAGING — US US ABDOMEN LIMITED
1 series · 14 of 25 positions shown · non-contrast
Comparison: None.

CLINICAL DATA: Elevated alkaline phosphatase.

EXAM:
ULTRASOUND ABDOMEN LIMITED RIGHT UPPER QUADRANT

[Series 1: us abdomen limited ruq (liver/gb) · 14 of 36 slices shown]
[im 1/36]
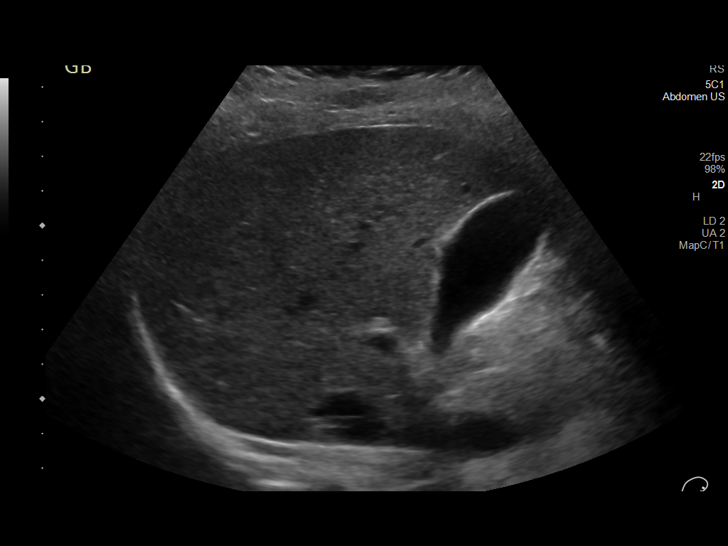
[im 3/36]
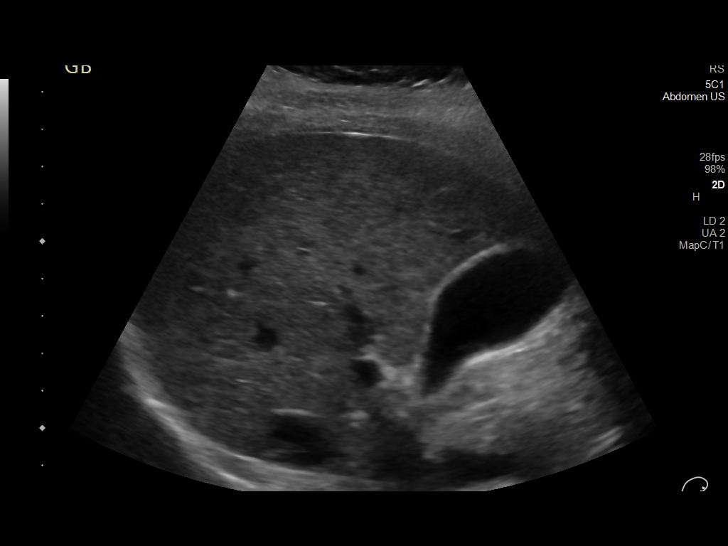
[im 6/36]
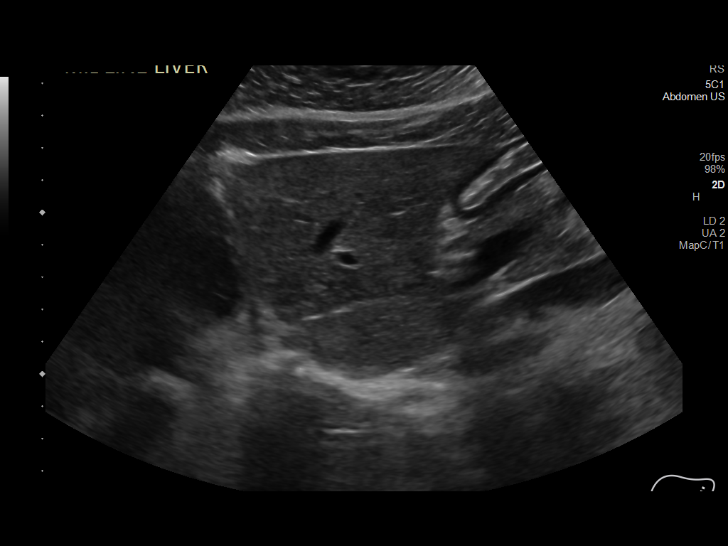
[im 9/36]
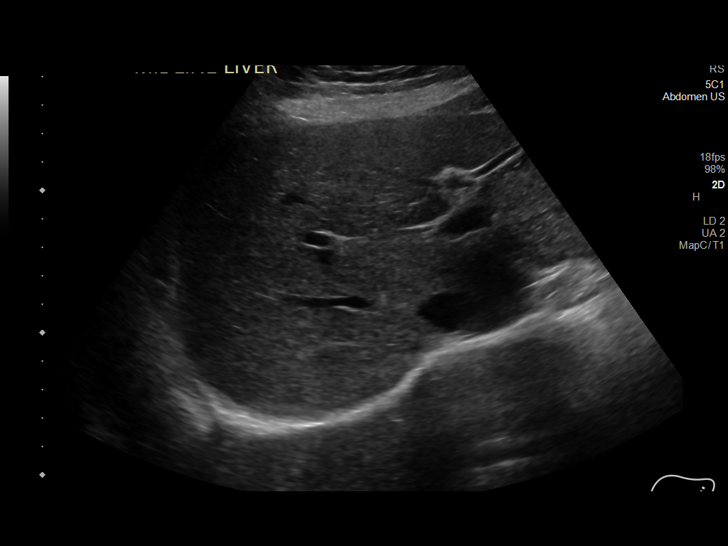
[im 12/36]
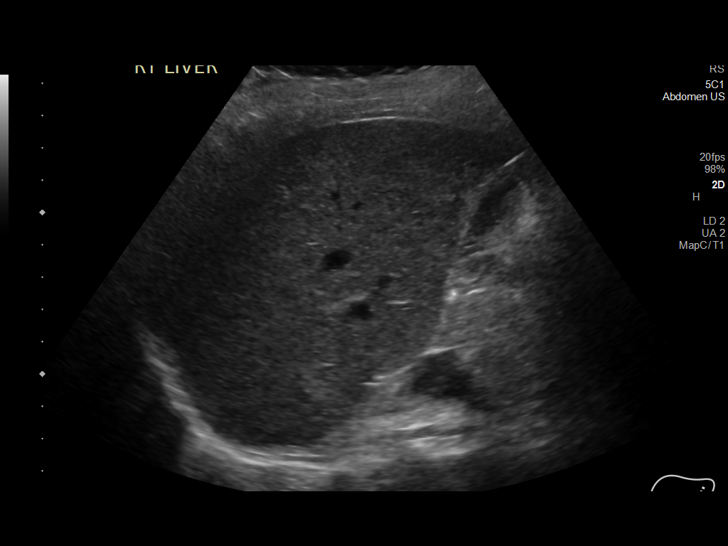
[im 14/36]
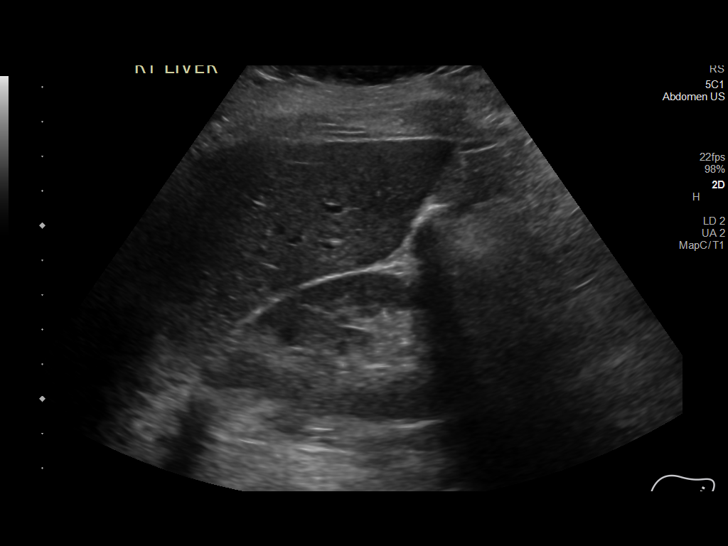
[im 17/36]
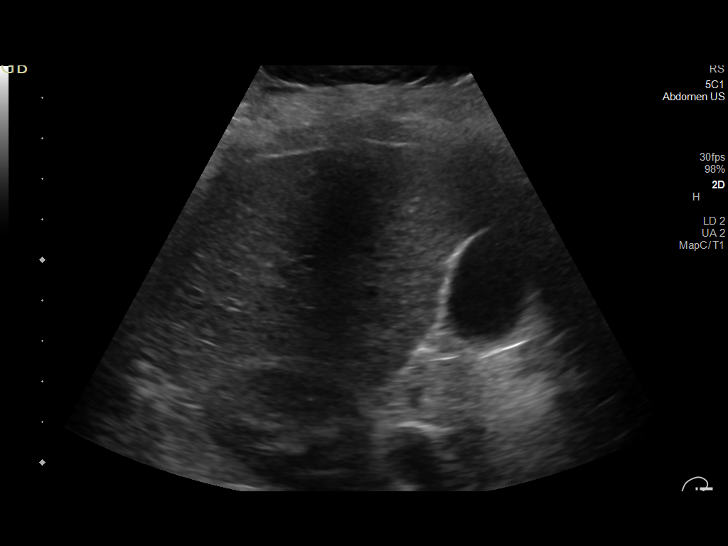
[im 19/36]
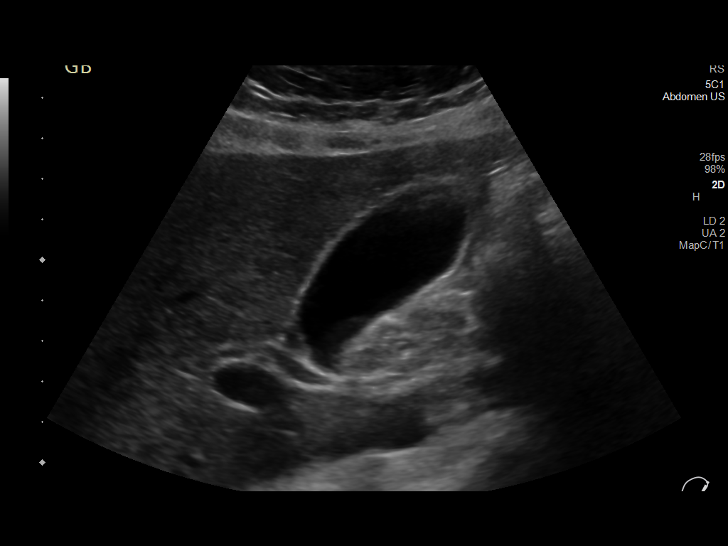
[im 22/36]
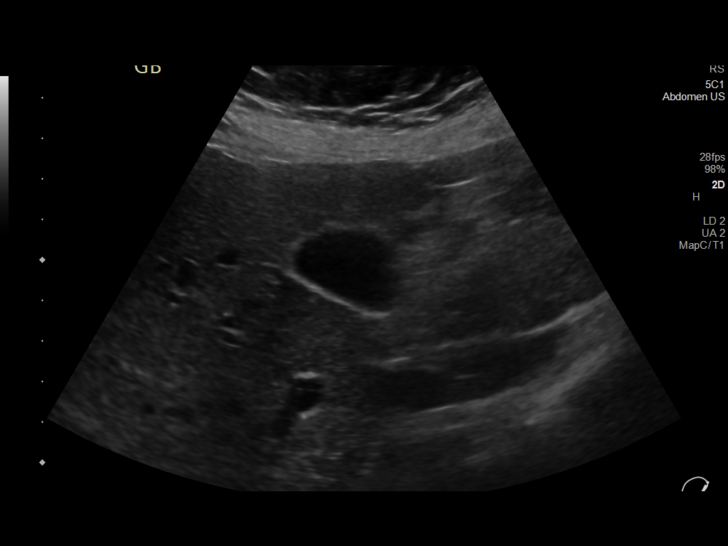
[im 24/36]
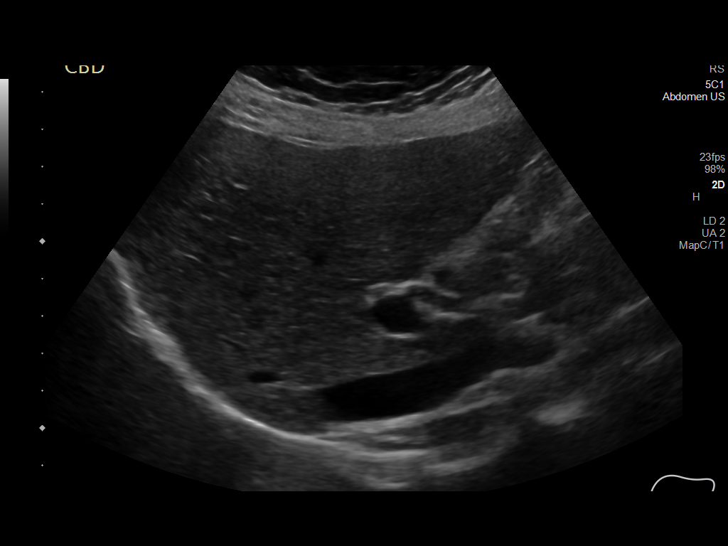
[im 27/36]
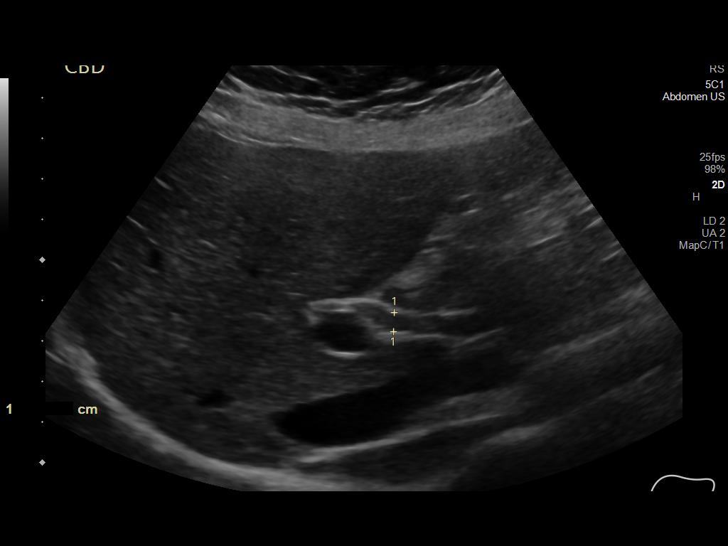
[im 30/36]
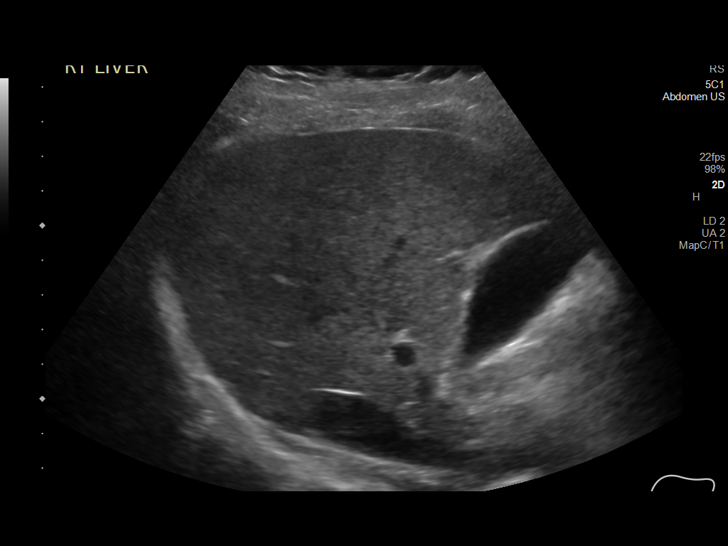
[im 33/36]
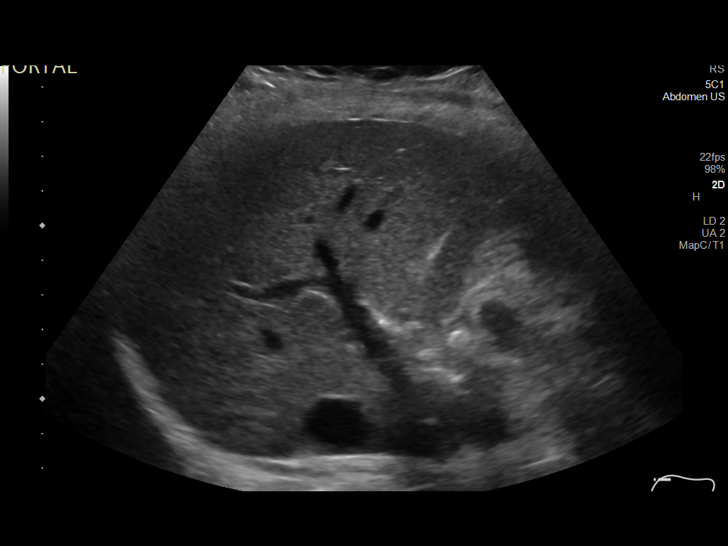
[im 36/36]
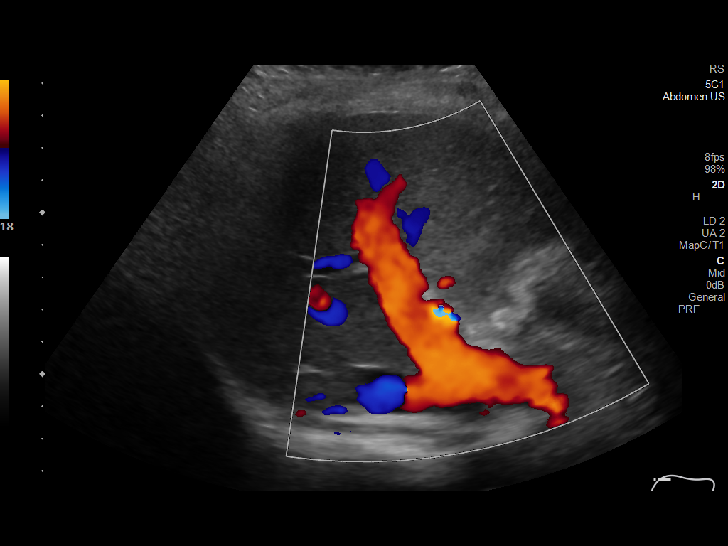

[14 of 25 positions shown; findings below may reference images not displayed]

FINDINGS: Gallbladder:

No gallstones or wall thickening visualized (2.6 mm). No sonographic
Murphy sign noted by sonographer.

Common bile duct:

Diameter: 4.7 mm

Liver:

No focal lesion identified. Within normal limits in parenchymal
echogenicity. Portal vein is patent on color Doppler imaging with
normal direction of blood flow towards the liver.

Other: None.
IMPRESSION: Normal right upper quadrant ultrasound.

## 2021-03-02 MED ORDER — GADOBUTROL 1 MMOL/ML IV SOLN
7.5000 mL | Freq: Once | INTRAVENOUS | Status: AC | PRN
  Administered 2021-03-02: 7.5 mL via INTRAVENOUS

## 2021-03-09 NOTE — Progress Notes (Signed)
    SUBJECTIVE:   CHIEF COMPLAINT / HPI:   Anxiety/depressed mood follow up Started on Zoloft last visit (took 25mg  daily for 1 week, then titrated up to 50mg  daily). Reports it is helping. States if her depression was a 10 before, it's now an 8. Interpreter notices a significant difference- interpreter notes they would speak on the phone and she was always tearful before, but now has a much better affect. Patient has not noticed any adverse effects with the Zoloft. She still endorses a lot of stress, lack of appetite, and difficulty sleeping. Never picked up the melatonin. Denies SI. Her 59 month old daughter brings her tremendous joy, she feels better and smiles when she holds her.  Headaches follow up Patient complained of headaches and pulsatile tinnitus at last visit. MR brain and MRA neck were normal. She was advised to use Ibuprofen for presumed tension headaches. Also advised to use melatonin to help with sleep. Patient taking the Ibuprofen occasionally. Never picked up the melatonin. Today she reports her headaches have essentially resolved. This is no longer a concern to her. She was informed of the normal MR results and was appreciative.   Cholestasis of pregnancy w/persistent alk phos elevation Alk phos downtrended and is now wnl. RUQ ultrasound on 9/22 was normal. Patient informed of normal ultrasound results. She did not have any further questions today.  PERTINENT  PMH / PSH: [place] refugee  OBJECTIVE:   BP 100/70   Pulse 68   Wt 163 lb 9.6 oz (74.2 kg)   SpO2 97%   BMI 29.92 kg/m   General: NAD, pleasant, able to participate in exam Respiratory: No respiratory distress Skin: warm and dry, no rashes noted Psych: Normal affect and mood Neuro: grossly intact   ASSESSMENT/PLAN:   Depressed mood, anxious mood Improving on Zoloft 50mg  daily. Still with significant stress, decreased appetite, and trouble sleeping. No SI. She has only been on medication for 2-3 weeks, so  hopefully will see continued improvement with time. -Continue Zoloft 50mg  daily -Return in 6 weeks. Consider increasing dose at that time -Patient aware of crisis resources if needed -Start melatonin 3mg  nightly to help with sleep  Severe frontal headaches MRI/MRA normal. Headaches have essentially resolved. She is not concerned about headaches today. -Patient informed of normal imaging results -Continue Ibuprofen 600mg  as needed -Discussed headache prevention strategies (addressing stress, sleep, proper hydration, etc) -Start Melatonin 3mg  to help with sleep  History of cholestasis during pregnancy Alk phos downtrended and is now wnl. RUQ ultrasound was normal. Patient informed of results. No additional workup needed today.   Health Maintenance Flu shot given today Patient will make separate appt for pap smear  In-person Farsi interpreter used for duration of encounter  Alcus Dad, MD [place]

## 2021-03-10 ENCOUNTER — Encounter: Payer: Self-pay | Admitting: Family Medicine

## 2021-03-10 ENCOUNTER — Ambulatory Visit (INDEPENDENT_AMBULATORY_CARE_PROVIDER_SITE_OTHER): Payer: Medicaid Other | Admitting: Family Medicine

## 2021-03-10 ENCOUNTER — Other Ambulatory Visit: Payer: Self-pay

## 2021-03-10 VITALS — BP 100/70 | HR 68 | Wt 163.6 lb

## 2021-03-10 DIAGNOSIS — Z8719 Personal history of other diseases of the digestive system: Secondary | ICD-10-CM

## 2021-03-10 DIAGNOSIS — Z23 Encounter for immunization: Secondary | ICD-10-CM | POA: Diagnosis not present

## 2021-03-10 DIAGNOSIS — R519 Headache, unspecified: Secondary | ICD-10-CM

## 2021-03-10 DIAGNOSIS — Z8759 Personal history of other complications of pregnancy, childbirth and the puerperium: Secondary | ICD-10-CM

## 2021-03-10 DIAGNOSIS — R4589 Other symptoms and signs involving emotional state: Secondary | ICD-10-CM | POA: Diagnosis not present

## 2021-03-10 DIAGNOSIS — G479 Sleep disorder, unspecified: Secondary | ICD-10-CM | POA: Diagnosis not present

## 2021-03-10 MED ORDER — MELATONIN 3 MG PO TABS: 3.0000 mg | ORAL_TABLET | Freq: Every day | ORAL | 0 refills | Status: DC

## 2021-03-10 MED ORDER — PRENATAL 28-0.8 MG PO TABS: ORAL_TABLET | ORAL | 0 refills | Status: DC

## 2021-03-10 MED ORDER — SERTRALINE HCL 50 MG PO TABS: 50.0000 mg | ORAL_TABLET | Freq: Every day | ORAL | 0 refills | Status: DC

## 2021-03-10 NOTE — Patient Instructions (Addendum)
It was great to meet you!  -The imaging of your head and your liver/gallbladder were both normal. This is good news -Continue taking the Sertraline once daily for your mood (the dose is 50mg ) -We will check in on your mood in ~6 weeks. We can increase the dose of the Sertraline if needed -Start taking Melatonin 3mg  (1 tablet) at bedtime to help with sleep  Take care and seek immediate care sooner if you develop any concerns.  Dr. Family Medicine

## 2021-03-11 DIAGNOSIS — Z419 Encounter for procedure for purposes other than remedying health state, unspecified: Secondary | ICD-10-CM | POA: Diagnosis not present

## 2021-03-11 DIAGNOSIS — R4589 Other symptoms and signs involving emotional state: Secondary | ICD-10-CM | POA: Insufficient documentation

## 2021-03-11 DIAGNOSIS — F419 Anxiety disorder, unspecified: Secondary | ICD-10-CM | POA: Insufficient documentation

## 2021-03-11 NOTE — Assessment & Plan Note (Signed)
MRI/MRA normal. Headaches have essentially resolved. She is not concerned about headaches today. -Patient informed of normal imaging results -Continue Ibuprofen 600mg  as needed -Discussed headache prevention strategies (addressing stress, sleep, proper hydration, etc) -Start Melatonin 3mg  to help with sleep

## 2021-03-11 NOTE — Assessment & Plan Note (Signed)
Alk phos downtrended and is now wnl. RUQ ultrasound was normal. Patient informed of results. No additional workup needed today.

## 2021-03-11 NOTE — Assessment & Plan Note (Signed)
>>  ASSESSMENT AND PLAN FOR SEVERE FRONTAL HEADACHES WRITTEN ON 03/11/2021  5:37 PM BY MALVINA ELLEN, MD  MRI/MRA normal. Headaches have essentially resolved. She is not concerned about headaches today. -Patient informed of normal imaging results -Continue Ibuprofen  600mg  as needed -Discussed headache prevention strategies (addressing stress, sleep, proper hydration, etc) -Start Melatonin 3mg  to help with sleep

## 2021-03-11 NOTE — Assessment & Plan Note (Addendum)
Improving on Zoloft 50mg  daily. Still with significant stress, decreased appetite, and trouble sleeping. No SI. She has only been on medication for 2-3 weeks, so hopefully will see continued improvement with time. -Continue Zoloft 50mg  daily -Return in 6 weeks. Consider increasing dose at that time -Patient aware of crisis resources if needed -Start melatonin 3mg  nightly to help with sleep

## 2021-04-07 ENCOUNTER — Ambulatory Visit (HOSPITAL_COMMUNITY)
Admission: EM | Admit: 2021-04-07 | Discharge: 2021-04-07 | Disposition: A | Discharge status: Home / Self Care | Payer: Medicaid Other | Attending: Medical Oncology | Admitting: Medical Oncology

## 2021-04-07 ENCOUNTER — Encounter (HOSPITAL_COMMUNITY): Payer: Self-pay | Admitting: Emergency Medicine

## 2021-04-07 ENCOUNTER — Other Ambulatory Visit: Payer: Self-pay

## 2021-04-07 DIAGNOSIS — Z789 Other specified health status: Secondary | ICD-10-CM

## 2021-04-07 DIAGNOSIS — L249 Irritant contact dermatitis, unspecified cause: Secondary | ICD-10-CM

## 2021-04-07 MED ORDER — TRIAMCINOLONE ACETONIDE 0.1 % EX CREA: 1.0000 "application " | TOPICAL_CREAM | Freq: Two times a day (BID) | CUTANEOUS | 0 refills | Status: DC

## 2021-04-07 NOTE — ED Triage Notes (Signed)
Pt reports having allergic reaction on left 2nd 3rd and starting 4th finger for 3 days that itch and skin starting to break. Denies new creams, lotions or soaps.

## 2021-04-07 NOTE — ED Provider Notes (Addendum)
[facility]    CSN: [id number] Arrival date & time: 04/07/21  1030      History   Chief Complaint Chief Complaint  Patient presents with   Rash    HPI Diana Hopkins is a 25 y.o. female. Pashto interpretor helped with our visit today   HPI  Rash: Pt states that over the past 3 weeks she has had finger irritation of mostly her left fingers. Rash feels rough and dry. She states that it is slightly progressive in nature to other fingers but not beyond the hand. She has not tried anything for symptoms. No fevers, bleeding, body involvement. No known new products, medications or foods.   Past Medical History:  Diagnosis Date   Cholestasis during pregnancy in third trimester    Medical history non-contributory     Patient Active Problem List   Diagnosis Date Noted   Depressed mood 03/11/2021   Encounter for health examination of refugee 02/07/2021   Severe frontal headaches 02/07/2021   History of cholestasis during pregnancy 02/07/2021   Vaginal delivery 09/30/2020   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 Authorizing Provider  triamcinolone cream (KENALOG) 0.1 % Apply 1 application topically 2 (two) times daily. 04/07/21  Yes Rett Stehlik M, PA-C  ibuprofen (ADVIL) 600 MG tablet TAKE ONE TABLET BY MOUTH EVERY 8 HOURS AS NEEDED 02/28/21   Maury Dus, MD  levonorgestrel (LILETTA, 52 MG,) 20.1 MCG/DAY IUD 1 each by Intrauterine route once.    [provider]  melatonin 3 MG TABS tablet Take 1 tablet (3 mg total) by  mouth at bedtime. 03/10/21   Maury Dus, MD  Prenatal 28-0.8 MG TABS Take 1 tablet by mouth daily 03/10/21   Maury Dus, MD  sertraline (ZOLOFT) 50 MG tablet Take 1 tablet (50 mg total) by mouth daily. 03/10/21   Maury Dus, MD    Family History Family History  Problem Relation Age of Onset   Hepatitis B Father     Social History Social History   Tobacco Use   Smoking status: Never   Smokeless tobacco: Never  Vaping Use   Vaping Use: Never used  Substance Use Topics   Alcohol use: Never   Drug use: Never     Allergies   Patient has no known allergies.   Review of Systems Review of Systems  As stated above in HPI Physical Exam Triage Vital Signs ED Triage Vitals [04/07/21 1226]  Enc Vitals Group     BP 116/73     Pulse Rate 65     Resp 18     Temp 98.3 F (36.8 C)     Temp Source Oral     SpO2 98 %  Weight      Height      Head Circumference      Peak Flow      Pain Score      Pain Loc      Pain Edu?      Excl. in GC?    No data found.  Updated Vital Signs BP 116/73 (BP Location: Left Arm)   Pulse 65   Temp 98.3 F (36.8 C) (Oral)   Resp 18   SpO2 98%   Physical Exam Vitals and nursing note reviewed.  Constitutional:      General: She is not in acute distress.    Appearance: Normal appearance. She is not ill-appearing, toxic-appearing or diaphoretic.  Cardiovascular:     Pulses: Normal pulses.  Skin:    General: Skin is warm.     Capillary Refill: Capillary refill takes less than 2 seconds.     Comments: Superficial patches of mild erythema with thin dry scale with few scattered raised small erythematic papules of fingers. No other rash of body visualized.   Neurological:     Mental Status: She is alert and oriented to person, place, and time.     Sensory: No sensory deficit.     UC Treatments / Results  Labs (all labs ordered are listed, but only abnormal results are displayed) Labs Reviewed - No data to  display  EKG   Radiology No results found.  Procedures Procedures (including critical care time)  Medications Ordered in UC Medications - No data to display  Initial Impression / Assessment and Plan / UC Course  I have reviewed the triage vital signs and the nursing notes.  Pertinent labs & imaging results that were available during my care of the patient were reviewed by me and considered in my medical decision making (see chart for details).     New. Treating for dermatitis with triamcinolone with nighttime cotton gloves. Discussed that typically this dermatitis occurs from lotions or soaps.  Final Clinical Impressions(s) / UC Diagnoses   Final diagnoses:  Irritant contact dermatitis, unspecified trigger     Discharge Instructions      Apply cream at night then wear cotton gloves to help the medication soak in. They can be found in the spa/manicure section of the pharmacy.    ED Prescriptions     Medication Sig Dispense Auth. Provider   triamcinolone cream (KENALOG) 0.1 % Apply 1 application topically 2 (two) times daily. 30 g Jabree Rebert M, [place]      PDMP not reviewed this encounter.   Rushie Chestnut, PA-C 04/07/21 1339    Rushie Chestnut, [place] 04/07/21 1339

## 2021-04-07 NOTE — Discharge Instructions (Addendum)
Apply cream at night then wear cotton gloves to help the medication soak in. They can be found in the spa/manicure section of the pharmacy.

## 2021-04-11 DIAGNOSIS — Z419 Encounter for procedure for purposes other than remedying health state, unspecified: Secondary | ICD-10-CM | POA: Diagnosis not present

## 2021-05-11 DIAGNOSIS — Z419 Encounter for procedure for purposes other than remedying health state, unspecified: Secondary | ICD-10-CM | POA: Diagnosis not present

## 2021-06-11 DIAGNOSIS — Z419 Encounter for procedure for purposes other than remedying health state, unspecified: Secondary | ICD-10-CM | POA: Diagnosis not present

## 2021-06-23 ENCOUNTER — Other Ambulatory Visit: Payer: Self-pay

## 2021-06-23 ENCOUNTER — Emergency Department (HOSPITAL_COMMUNITY)
Admission: EM | Admit: 2021-06-23 | Discharge: 2021-06-23 | Disposition: A | Discharge status: Home / Self Care | Payer: Medicaid Other | Attending: Student | Admitting: Student

## 2021-06-23 DIAGNOSIS — Z5321 Procedure and treatment not carried out due to patient leaving prior to being seen by health care provider: Secondary | ICD-10-CM | POA: Diagnosis not present

## 2021-06-23 DIAGNOSIS — Z743 Need for continuous supervision: Secondary | ICD-10-CM | POA: Diagnosis not present

## 2021-06-23 DIAGNOSIS — R064 Hyperventilation: Secondary | ICD-10-CM | POA: Diagnosis not present

## 2021-06-23 DIAGNOSIS — F419 Anxiety disorder, unspecified: Secondary | ICD-10-CM | POA: Diagnosis not present

## 2021-06-23 DIAGNOSIS — I10 Essential (primary) hypertension: Secondary | ICD-10-CM | POA: Diagnosis not present

## 2021-06-23 DIAGNOSIS — R6889 Other general symptoms and signs: Secondary | ICD-10-CM | POA: Diagnosis not present

## 2021-06-23 LAB — PREGNANCY, URINE: Preg Test, Ur: NEGATIVE

## 2021-06-23 MED ORDER — HYDROXYZINE HCL 50 MG PO TABS
50.0000 mg | ORAL_TABLET | Freq: Once | ORAL | Status: AC
  Administered 2021-06-23: 50 mg via ORAL
  Filled 2021-06-23: qty 1

## 2021-06-23 MED ORDER — ACETAMINOPHEN 500 MG PO TABS
1000.0000 mg | ORAL_TABLET | Freq: Once | ORAL | Status: AC
  Administered 2021-06-23: 500 mg via ORAL
  Filled 2021-06-23: qty 2

## 2021-06-23 NOTE — ED Provider Triage Note (Signed)
Emergency Medicine Provider Triage Evaluation Note  Diana Hopkins , a 26 y.o. female  was evaluated in triage.  Pt complains of anxiety. States that she has anxiety attacks relatively frequently since she moved to the [place] 1 year ago.  She denies any chest pain states that she was having some difficulty breathing earlier but that is resolved.  She states that she feels much improved now.  States that she does not have any suicidal or homicidal ideation no auditory visual hallucinations.  Review of Systems  Positive: Anxiety Negative: Chest pain  Physical Exam  BP (!) 112/55    Pulse 77    Temp 98.5 F (36.9 C) (Oral)    Resp 18    SpO2 99%  Gen:   Awake, no distress   Resp:  Normal effort  MSK:   Moves extremities without difficulty  Other:  Moves all 4 extremities.  Smile symmetric, grip symmetric RRR, lungs clear  Medical Decision Making  Medically screening exam initiated at 8:53 PM.  Appropriate orders placed.  Diana Hopkins was informed that the remainder of the evaluation will be completed by another provider, this initial triage assessment does not replace that evaluation, and the importance of remaining in the ED until their evaluation is complete.  Patient provided hydroxyzine and Tylenol.  I went to reassess patient and she has eloped   Gailen Shelter, [place] 06/23/21 2056

## 2021-06-23 NOTE — ED Triage Notes (Signed)
Pt here from home via [facility] after being found laying on the floor hyperventilating by neighbor. Pt wasn't speaking, but had intermittent hyperventilation. Per husband pt has no PMH. 162/100, 100HR, 100% RA, cbg 108

## 2021-07-01 NOTE — ED Provider Notes (Signed)
Patient eloped prior to my arrival   Glendora Score, MD 07/01/21 [phone]

## 2021-07-12 DIAGNOSIS — Z419 Encounter for procedure for purposes other than remedying health state, unspecified: Secondary | ICD-10-CM | POA: Diagnosis not present

## 2021-07-20 ENCOUNTER — Other Ambulatory Visit: Payer: Self-pay

## 2021-07-20 ENCOUNTER — Ambulatory Visit (INDEPENDENT_AMBULATORY_CARE_PROVIDER_SITE_OTHER): Payer: Medicaid Other | Admitting: Student

## 2021-07-20 ENCOUNTER — Other Ambulatory Visit (HOSPITAL_COMMUNITY)
Admission: RE | Admit: 2021-07-20 | Discharge: 2021-07-20 | Disposition: A | Discharge status: Home / Self Care | Payer: Medicaid Other | Source: Ambulatory Visit | Attending: Student | Admitting: Student

## 2021-07-20 VITALS — BP 106/42 | HR 89 | Wt 170.7 lb

## 2021-07-20 DIAGNOSIS — N898 Other specified noninflammatory disorders of vagina: Secondary | ICD-10-CM | POA: Insufficient documentation

## 2021-07-20 NOTE — Progress Notes (Signed)
°  History:  [name] is a 26 y.o. [phone] who presents to clinic today for complaints of yellow discharge, some odor and some lower abdominal pain. She denies dysuria, pain with intercourse, vaginal bleeding. She reports that the abdominal pain comes and goes but she cannot quantify when it comes and goes and any causal or alleviating factors. She states that she had this before when she lived in [place] and it was treated with antibiotics.   The following portions of the patient's history were reviewed and updated as appropriate: allergies, current medications, family history, past medical history, social history, past surgical history and problem list.  Review of Systems:  Review of Systems  Constitutional: Negative.   HENT: Negative.    Respiratory:  Negative for hemoptysis.   Cardiovascular: Negative.   Genitourinary:  Negative for urgency.  Musculoskeletal: Negative.   Skin: Negative.   Neurological: Negative.   Psychiatric/Behavioral: Negative.       Objective:  Physical Exam BP (!) 106/42    Pulse 89    Wt 170 lb 11.2 oz (77.4 kg)    Breastfeeding No    BMI 31.22 kg/m  Physical Exam Constitutional:      Appearance: Normal appearance.  HENT:     Nose: Nose normal.  Genitourinary:    General: Normal vulva.     Comments: NEFG; IUD strings visualized, no discharge in vagina, no CMT tenderness, no odor or lesions.  Musculoskeletal:        General: Normal range of motion.  Skin:    General: Skin is warm.  Neurological:     General: No focal deficit present.     Mental Status: She is alert.  Psychiatric:        Mood and Affect: Mood normal.      Labs and Imaging No results found for this or any previous visit (from the past 24 hour(s)).  No results found.  Health Maintenance Due  Topic Date Due   HPV VACCINES (1 - 2-dose series) Never done   PAP-Cervical Cytology Screening  Never done   PAP SMEAR-Modifier  Never done   COVID-19 Vaccine (2 - Pfizer  series) 03/21/2020     Assessment & Plan:   1. Vaginal discharge   -will send culture and treat appropriately -vaginal and bimanual exam is reassuring -confirmed that patient does not want IUD removed  Approximately 20 minutes of total time was spent with this patient on exam and coordination.   [name], CNM 07/20/2021 9:59 AM

## 2021-07-21 LAB — CERVICOVAGINAL ANCILLARY ONLY
Bacterial Vaginitis (gardnerella): POSITIVE — AB
Candida Glabrata: NEGATIVE
Candida Vaginitis: NEGATIVE
Comment: NEGATIVE
Comment: NEGATIVE
Comment: NEGATIVE
Comment: NEGATIVE
Trichomonas: NEGATIVE

## 2021-07-22 ENCOUNTER — Other Ambulatory Visit: Payer: Self-pay | Admitting: Student

## 2021-07-22 MED ORDER — METRONIDAZOLE 500 MG PO TABS: 500.0000 mg | ORAL_TABLET | Freq: Two times a day (BID) | ORAL | 0 refills | Status: DC

## 2021-07-25 ENCOUNTER — Telehealth: Payer: Self-pay | Admitting: *Deleted

## 2021-07-25 NOTE — Telephone Encounter (Signed)
-----   Message from [name], CNM sent at 07/22/2021  1:36 PM EST ----- Hi! Can you call this patient and tell her that she has BV?  Her flagyl has been called to the pharmacy in [place].  Thank you ! KK

## 2021-07-25 NOTE — Telephone Encounter (Signed)
I called Diana Hopkins with [place] # [phone] at her mobile number and heard message voicemail not set up. I called her home number which is her husband's phone. He said she is at work and asked that we not call her until 3pm or after at [phone]. I thanked him for that information. Yazlin Ekblad,RN

## 2021-08-01 NOTE — Telephone Encounter (Signed)
Called pt with [facility] interpreter ID [phone]. Spoke with patient's husband who gives updated cell phone number for patient. Contact information updated. Called new phone number; VM left stating I am calling with results, new medications, and callback number given.

## 2021-08-09 DIAGNOSIS — Z419 Encounter for procedure for purposes other than remedying health state, unspecified: Secondary | ICD-10-CM | POA: Diagnosis not present

## 2021-08-10 ENCOUNTER — Encounter: Payer: Self-pay | Admitting: Family Medicine

## 2021-08-10 ENCOUNTER — Other Ambulatory Visit: Payer: Self-pay

## 2021-08-10 ENCOUNTER — Ambulatory Visit (INDEPENDENT_AMBULATORY_CARE_PROVIDER_SITE_OTHER): Payer: Medicaid Other | Admitting: Family Medicine

## 2021-08-10 VITALS — BP 104/73 | HR 79 | Wt 172.0 lb

## 2021-08-10 DIAGNOSIS — K219 Gastro-esophageal reflux disease without esophagitis: Secondary | ICD-10-CM | POA: Diagnosis not present

## 2021-08-10 DIAGNOSIS — L301 Dyshidrosis [pompholyx]: Secondary | ICD-10-CM | POA: Diagnosis not present

## 2021-08-10 MED ORDER — OMEPRAZOLE 40 MG PO CPDR: 40.0000 mg | DELAYED_RELEASE_CAPSULE | Freq: Every day | ORAL | 1 refills | Status: DC

## 2021-08-10 MED ORDER — TRIAMCINOLONE ACETONIDE 0.1 % EX OINT: 1.0000 "application " | TOPICAL_OINTMENT | Freq: Two times a day (BID) | CUTANEOUS | 1 refills | Status: DC

## 2021-08-10 NOTE — Patient Instructions (Addendum)
For your hand rash: apply steroid ointment twice for 10 days  ? ?For your sore throat and chest burning:  Take omeprazole one capsule daily for the next 14 days.  ? ?I will call you if the stomach bacteria test is positive.  ? ? ?

## 2021-08-10 NOTE — Progress Notes (Signed)
? ?  SUBJECTIVE:  ? ?CHIEF COMPLAINT / HPI:  ? ?Chief Complaint  ?Patient presents with  ? Cough  ? Sore Throat  ? Rash  ? ?A [name] was used for this encounter:  Name: Madilyn Fireman ID #[id number] ? ?Diana Hopkins is a 26 y.o. female here for cough for the past 2 weeks that is worse at night.  States that she has a burning sensation in her chest as well.  Pain is worse with eating fatty foods.  Previously took a medicine that help with this.  She also states she has a scratchy throat.  Denies vomiting, diarrhea, back pain, runny nose, myalgias.  No recent sick contacts. ? ?Pt reports rash on her left hand that has been there for the past few weeks and she feels like it is worsening.  Notes that when she is cleaning her hands are burning.  Reports similar symptoms previously.  No one else has similar rash.  Denies rash on any other location. ? ? ?PERTINENT  PMH / PSH: reviewed and updated as appropriate  ? ?OBJECTIVE:  ? ?BP 104/73   Pulse 79   Wt 172 lb (78 kg)   SpO2 100%   BMI 31.46 kg/m?   ? ?GEN: pleasant well appearing female, in no acute distress  ?HENT: Moist mucous membranes, mild erythema, no tonsillar exudates or oral lesions ?NECK: Normal range of motion, no cervical lymphadenopathy ?CV: regular rate and rhythm ?RESP: no increased work of breathing, clear to ascultation bilaterally ?SKIN: warm, right hand without rash, left hand with multiple vesicular options on the fingers, palms dorsal aspect  ?NEURO: alert, moves all extremities appropriately ?PSYCH: Normal affect, appropriate speech and behavior  ? ? ?ASSESSMENT/PLAN:  ? ?Dyshidrotic eczema ?Exam consistent with dyshidrotic eczema.  Treat with Kenalog ointment.  Advised to wear gloves with cleaning.  ? ?Gastroesophageal reflux disease ?History consistent with GERD.  Given refugee status and ongoing symptoms we will test for H. pylori.  Treat with omeprazole daily.  Trial off omeprazole in 6 weeks.  ?  ? ? ?Katha Cabal, DO ?PGY-3, [facility] ?08/11/2021  ? ? ? ? ? ? ? ? ?

## 2021-08-11 DIAGNOSIS — L301 Dyshidrosis [pompholyx]: Secondary | ICD-10-CM | POA: Insufficient documentation

## 2021-08-11 DIAGNOSIS — K219 Gastro-esophageal reflux disease without esophagitis: Secondary | ICD-10-CM | POA: Insufficient documentation

## 2021-08-11 HISTORY — DX: Dyshidrosis (pompholyx): L30.1

## 2021-08-11 NOTE — Assessment & Plan Note (Signed)
History consistent with GERD.  Given refugee status and ongoing symptoms we will test for H. pylori.  Treat with omeprazole daily.  Trial off omeprazole in 6 weeks.  ?

## 2021-08-11 NOTE — Assessment & Plan Note (Signed)
Exam consistent with dyshidrotic eczema.  Treat with Kenalog ointment.  Advised to wear gloves with cleaning.  ?

## 2021-08-12 LAB — H. PYLORI BREATH TEST: H pylori Breath Test: NEGATIVE

## 2021-08-16 ENCOUNTER — Ambulatory Visit: Payer: Medicaid Other | Admitting: Family Medicine

## 2021-08-16 DIAGNOSIS — Z72821 Inadequate sleep hygiene: Secondary | ICD-10-CM | POA: Insufficient documentation

## 2021-08-16 NOTE — Progress Notes (Deleted)
? ? ?SUBJECTIVE:  ? ?CHIEF COMPLAINT / HPI:  ? ?[name] utilized throughout visit: Name ***.  ? ?Sleep Difficulty: Patent has a {0-10:33138} {time units w/ wo plural:11} history of symptoms of difficulty sleeping. Patient generally gets {numbers (fuzzy):14653} hours of sleep per night, and states they generally have {sleep quality:17851}. Denies snoring or apneic episodes. She has been seen in our clinic for this before and has been instructed to continue with Melatonin. Pt reports *** compliance and sees *** benefit.  ? ?Typical bedtime and rise time on weekdays and weekends. Presence of shift work. ?Quality of sleep: Is sleep refreshing? Is it difficult to get out of bed at the end of the sleep period? Are there awakenings during the sleep period? Poor quality sleep may be a clue to a specific sleep disorder such as: RLS, OSA, chronic insomnia  ?Daytime naps  ?Daytime fatigue and sleepiness  ? ?Cricadian rhythm disorders  ?Medications? ?Normal variant  ?Sleep state misperception  ? ?History of Depressed/Anxious Mood: ?Patient has been on Zoloft 50 mg daily. She complains of {depression symptoms:1002}. Onset was approximately {numbers; 0-10:33138} {time units:11} ago, {clinical course - history:17::"unchanged"} since that time.  She denies current suicidal and homicidal plan or intent.   Family history significant for {fam hx:15335}.Possible organic causes contributing are: {possible organic causes:15339}.  Risk factors: {depression risk factors:1001} Previous treatment includes {anxiety treatments:15336} and {depression treatment:1010}. Benefit from Zoloft ***.  ? ? ?Flowsheet Row Office Visit from 08/10/2021 in [facility]  ?PHQ-9 Total Score 2  ? ?  ?  ? ? ?PERTINENT  PMH / PSH:  ?Patient Active Problem List  ? Diagnosis Date Noted  ? Dyshidrotic eczema 08/11/2021  ? Gastroesophageal reflux disease 08/11/2021  ? Depressed mood 03/11/2021  ? Encounter for health examination of  refugee 02/07/2021  ? Severe frontal headaches 02/07/2021  ? History of cholestasis during pregnancy 02/07/2021  ? Varicose veins of both legs with edema 08/22/2020  ? H/O hand surgery 05/23/2020  ? ? ?OBJECTIVE:  ? ?There were no vitals taken for this visit.  ?General: Alert and oriented in no apparent distress ?Heart: Regular rate and rhythm with no murmurs appreciated ?Lungs: CTA bilaterally, no wheezing ?Abdomen: Bowel sounds present, no abdominal pain ?Skin: Warm and dry ?Extremities: No lower extremity edema ?*** ? ?ASSESSMENT/PLAN:  ? ?No problem-specific Assessment & Plan notes found for this encounter. ?  ?6 or more sufficient  ? ?AVS  ? ?Keep sleep diary  ? ?Having a consistent bedtime and rise time leads to more regular sleep schedules and avoids periods of sleep deprivation or periods of extended wakefulness during the night.   ?Avoid napping Avoid napping, especially naps lasting longer than 1 hour and naps late in the day.  ?Limit caffeine Avoid caffeine after lunch. The time between lunch and bedtime represents approximately 2 half-lives for caffeine, and this time window allows for most caffeine to be metabolized before bedtime.  ?Limit alcohol Recommendations are typically focused on avoiding alcohol near bedtime. Alcohol is initially sedating, but activating as it is metabolized. Alcohol also negatively impacts sleep architecture.  ?Avoid nicotine Nicotine is a stimulant and should be avoided near bedtime and at night.  ?Exercise Daytime physical activity is encouraged, in particular, 4 to 6 hours before bedtime, as this may facilitate sleep onset. Rigorous exercise within 2 hours of bedtime is discouraged.  ?Keep the sleep environment quiet and dark Noise and light exposure during the night can disrupt sleep. White noise or  ear plugs are often recommended to reduce noise. Using blackout shades or an eye mask is commonly recommended to reduce light. ?This may also include avoiding exposure to  television or technology near bedtime, as this can have an impact on circadian rhythms by shifting sleep timing later.  ?Bedroom clock Avoid checking the time at night. This includes alarm clocks and other time pieces (eg, watches and smart phones). Checking the time increases cognitive arousal and prolongs wakefulness.  ?Evening eating Avoid a large meal close to bedtime. Eat a healthy and filling (but not too heavy) meal in the early evening and avoid late-night snacks.  ? ? ? ?Alfredo Martinez, MD ?[facility]  ? ?

## 2021-08-17 ENCOUNTER — Ambulatory Visit (INDEPENDENT_AMBULATORY_CARE_PROVIDER_SITE_OTHER): Payer: Medicaid Other | Admitting: Student

## 2021-08-17 ENCOUNTER — Encounter: Payer: Self-pay | Admitting: Student

## 2021-08-17 ENCOUNTER — Other Ambulatory Visit: Payer: Self-pay

## 2021-08-17 VITALS — BP 96/77 | HR 77 | Temp 98.2°F | Wt 171.2 lb

## 2021-08-17 DIAGNOSIS — R059 Cough, unspecified: Secondary | ICD-10-CM | POA: Insufficient documentation

## 2021-08-17 DIAGNOSIS — Z72821 Inadequate sleep hygiene: Secondary | ICD-10-CM | POA: Diagnosis not present

## 2021-08-17 DIAGNOSIS — R051 Acute cough: Secondary | ICD-10-CM

## 2021-08-17 DIAGNOSIS — G4700 Insomnia, unspecified: Secondary | ICD-10-CM | POA: Insufficient documentation

## 2021-08-17 DIAGNOSIS — G479 Sleep disorder, unspecified: Secondary | ICD-10-CM | POA: Diagnosis not present

## 2021-08-17 HISTORY — DX: Cough, unspecified: R05.9

## 2021-08-17 MED ORDER — BENZONATATE 100 MG PO CAPS
100.0000 mg | ORAL_CAPSULE | Freq: Two times a day (BID) | ORAL | 0 refills | Status: DC | PRN
Start: 2021-08-17 — End: 2021-08-31

## 2021-08-17 MED ORDER — AMITRIPTYLINE HCL 25 MG PO TABS: 25.0000 mg | ORAL_TABLET | Freq: Every day | ORAL | 0 refills | Status: DC

## 2021-08-17 NOTE — Assessment & Plan Note (Signed)
>>  ASSESSMENT AND PLAN FOR DIFFICULTY SLEEPING WRITTEN ON 08/17/2021  1:42 PM BY MAXWELL, ALLEE, MD  Appears that the lack of sleep is related to life stressors and anxious mood. No evidence of OSA or RLS. No known medications to cause the onset of insomnia. Also considering circadian rhythm disorders. This does not appear to be a normal variant of sleeping. Patient needs further extensive counselling on sleep hygiene and possibly utilize a sleep diary, will discuss this with the patient on next visit. For now, continue with additional Amitriptyline 25 mg with Zoloft 50 mg. Can also consider Trazodone and Doxepin if this is not beneficial. Will have her follow up in 2 weeks.

## 2021-08-17 NOTE — Assessment & Plan Note (Addendum)
Appears that the lack of sleep is related to life stressors and anxious mood. No evidence of OSA or RLS. No known medications to cause the onset of insomnia. Also considering circadian rhythm disorders. This does not appear to be a normal variant of sleeping. Patient needs further extensive counselling on sleep hygiene and possibly utilize a sleep diary, will discuss this with the patient on next visit. For now, continue with additional Amitriptyline 25 mg with Zoloft 50 mg. Can also consider Trazodone and Doxepin if this is not beneficial. Will have her follow up in 2 weeks.  ?

## 2021-08-17 NOTE — Progress Notes (Addendum)
? ? ?SUBJECTIVE:  ? ?CHIEF COMPLAINT / HPI:  ? ?[name] used: Gwenyth Bouillon #[id number] ? ?Cough: Patient complains of cough onset 3 weeks ago.  No known sickness to cause the onset of symptoms, patient's parents tend to have the same problem with coughing. The cough is non-productive, without wheezing, dyspnea. Pt does report blood tinged sputum, with shortness of breath during the cough, chest is painful during coughing and is aggravated by  cold liquids.  Pt was prescribed Omeprazole for GERD, no relief but H. Pylori testing was negative. Associated symptoms include: shortness of breath and sputum production. Patient does not have new pets. Patient believes that she has had asthma in the past but has never been formally diagnosed. May have taken medication for asthma in [place], thinks she was receiving a shot for it but is unsure of the name of the medication.  Patient does not have a history of environmental allergens. Patient does not have recent travel, arrived to [place] 1 year ago. Patient does not have a history of smoking. Patient has not previous CXR. Patient is unsure if she had a PPD done. ? ?Husband has TB, has been taking medication for latent TB for 6 months. She is unsure of where he goes for treatment or if she has been tested before.  ? ?Sleep Difficulty: Patient has a 1 year history of symptoms of difficulty sleeping that is likely secondary to stress per the patient. The sleep difficulty has been present since arriving from [place]. Patient generally gets 2 or 3 hours of sleep per night, and states she generally has difficulty falling asleep and poor quality. Pt falls asleep around 3AM and sleeps until 6AM. Denies snoring or apneic episodes. She has been seen in our clinic for this before and has been instructed to continue with Melatonin. Pt reports good compliance and sees minimal benefit. No shift work history. No daytime naps, does have daytime fatigue and sleepiness.  ?  ? ?PERTINENT   PMH / PSH:  ?Patient Active Problem List  ? Diagnosis Date Noted  ? Sleeping difficulty 08/17/2021  ? Cough 08/17/2021  ? Dyshidrotic eczema 08/11/2021  ? Gastroesophageal reflux disease 08/11/2021  ? Depressed mood 03/11/2021  ? Severe frontal headaches 02/07/2021  ? Varicose veins of both legs with edema 08/22/2020  ? H/O hand surgery 05/23/2020  ? ? ? ?OBJECTIVE:  ? ?BP 96/77   Pulse 77   Temp 98.2 ?F (36.8 ?C) (Oral)   Wt 171 lb 4 oz (77.7 kg)   SpO2 100%   BMI 31.32 kg/m?   ?Physical Exam ?Constitutional:   ?   General: She is not in acute distress. ?   Appearance: She is well-developed. She is not ill-appearing.  ?Eyes:  ?   Extraocular Movements:  ?   Right eye: Normal extraocular motion.  ?   Left eye: Normal extraocular motion.  ?Cardiovascular:  ?   Rate and Rhythm: Normal rate and regular rhythm.  ?Pulmonary:  ?   Effort: Pulmonary effort is normal. No respiratory distress.  ?   Breath sounds: Normal breath sounds. No stridor. No wheezing.  ?Musculoskeletal:     ?   General: Normal range of motion.  ?Skin: ?   General: Skin is warm.  ?Neurological:  ?   Mental Status: She is alert.  ?Psychiatric:     ?   Mood and Affect: Mood normal.  ?   Comments: Anxious behavior   ? ? ? ?ASSESSMENT/PLAN:  ? ?Sleeping difficulty ?Appears that  the lack of sleep is related to life stressors and anxious mood. No evidence of OSA or RLS. No known medications to cause the onset of insomnia. Also considering circadian rhythm disorders. This does not appear to be a normal variant of sleeping. Patient needs further extensive counselling on sleep hygiene and possibly utilize a sleep diary, will discuss this with the patient on next visit. For now, continue with additional Amitriptyline 25 mg with Zoloft 50 mg. Can also consider Trazodone and Doxepin if this is not beneficial. Will have her follow up in 2 weeks.  ? ?Cough ?Cough ongoing for about three weeks. Broad differential but most concerned about possibility of TB  exposure. Unsure who husband sees for treatment or if patient has been tested previously. Will obtain quantiferon TB to evaluate in addition to CXR given persistent nature of cough with blood tinged sputum. Also consider asthma (may need PFTs), GERD (H. Pylori negative), viral illness, mononucleosis. Await TB results for next steps in evaluation. Tessalon Pearls for symptomatic treatment.  ?  ? ?Alfredo Martinez, MD ?[facility]  ? ?

## 2021-08-17 NOTE — Patient Instructions (Addendum)
It was a pleasure to see you today! Thank you for choosing [facility] for your primary care. Martyn Ehrich was seen for sleep difficulty and cough.  ? ?Our plans for today were: ?Continue with amitriptyline nightly  ?Keep taking Sertraline  ?Please go get the chest xray at [facility]  ?I will notify you the the test results  ? ? ?Best,  ?Maurisha Mongeau Data processing manager  ? ?????? ?? ????? ?????? ??? ?? ???? ?? ??? ???? ?????? ????? ??????? ??? ???? ?????? ??? ????? ??? ??. ????? ???? ???? ???? ???? ? ???? ???? ??.  ? ??????? ??? ?? ???? ????? ??? ???: ?? ????? ?? ???????????? ?????  ?? ??? ????? ???? ????????  ?? ????? ??? ???? ?? ?? ??????????? ????????? ????  ?? ?? ?? ??? ????? ????? ?????  ? ????????  ???? ?????  ? ?

## 2021-08-17 NOTE — Assessment & Plan Note (Signed)
Appears that the lack of sleep is related to stress and anxious mood. No evidence of OSA or RLS. No known medications to cause the onset of insomnia. Also considering  ?

## 2021-08-17 NOTE — Assessment & Plan Note (Signed)
Cough ongoing for about three weeks. Broad differential but most concerned about possibility of TB exposure. Unsure who husband sees for treatment or if patient has been tested previously. Will obtain quantiferon TB to evaluate in addition to CXR given persistent nature of cough with blood tinged sputum. Also consider asthma (may need PFTs), GERD (H. Pylori negative), viral illness, mononucleosis. Await TB results for next steps in evaluation.  ?

## 2021-08-20 LAB — QUANTIFERON-TB GOLD PLUS
QuantiFERON Mitogen Value: >10 IU/mL
QuantiFERON Nil Value: 0.02 IU/mL
QuantiFERON TB1 Ag Value: 0.03 IU/mL
QuantiFERON TB2 Ag Value: 0.02 IU/mL
QuantiFERON-TB Gold Plus: NEGATIVE

## 2021-08-22 ENCOUNTER — Telehealth: Payer: Self-pay | Admitting: Student

## 2021-08-22 ENCOUNTER — Encounter: Payer: Self-pay | Admitting: Student

## 2021-08-22 NOTE — Progress Notes (Signed)
Sent a letter to the patient with normal results as I can not reach her via telephone. Will continue trying to reach her through phone.  ? ? ?Diana Hopkins  ?

## 2021-08-22 NOTE — Telephone Encounter (Signed)
Called patient to discuss TB results (results were negative) and reached VM with use of Landscape architect for [employer]. Left HIPAA compliant VM and will try to call again.  ? ?Alfredo Martinez, MD ?

## 2021-08-30 ENCOUNTER — Other Ambulatory Visit: Payer: Self-pay | Admitting: Student

## 2021-08-30 ENCOUNTER — Telehealth: Payer: Self-pay | Admitting: Family Medicine

## 2021-08-30 DIAGNOSIS — R051 Acute cough: Secondary | ICD-10-CM

## 2021-08-30 NOTE — Telephone Encounter (Signed)
Sending message to the provider who saw her for the cough. Diana Hopkins, CMA ? ?

## 2021-08-30 NOTE — Telephone Encounter (Signed)
Patient's husband came in stating that the patient needs a refill on the Benzonatate. She is completely out, and since she has been off the medicine the coughing has gotten worse. He does state that the medicine did not help enough and would like to discuss changing medication, she does have an upcoming appt on 09/04/21. ?

## 2021-08-31 ENCOUNTER — Ambulatory Visit
Admission: RE | Admit: 2021-08-31 | Discharge: 2021-08-31 | Disposition: A | Discharge status: Home / Self Care | Payer: Medicaid Other | Source: Ambulatory Visit | Attending: Family Medicine | Admitting: Family Medicine

## 2021-08-31 DIAGNOSIS — R051 Acute cough: Secondary | ICD-10-CM

## 2021-08-31 DIAGNOSIS — R059 Cough, unspecified: Secondary | ICD-10-CM | POA: Diagnosis not present

## 2021-08-31 IMAGING — DX DG CHEST 2V
2 series · 2 of 2 positions shown · non-contrast
Comparison: [DATE]

CLINICAL DATA: Productive cough and hemoptysis for 1 month, concern
for TB

EXAM:
CHEST - 2 VIEW

[dg chest 2 view (1 of 2)]
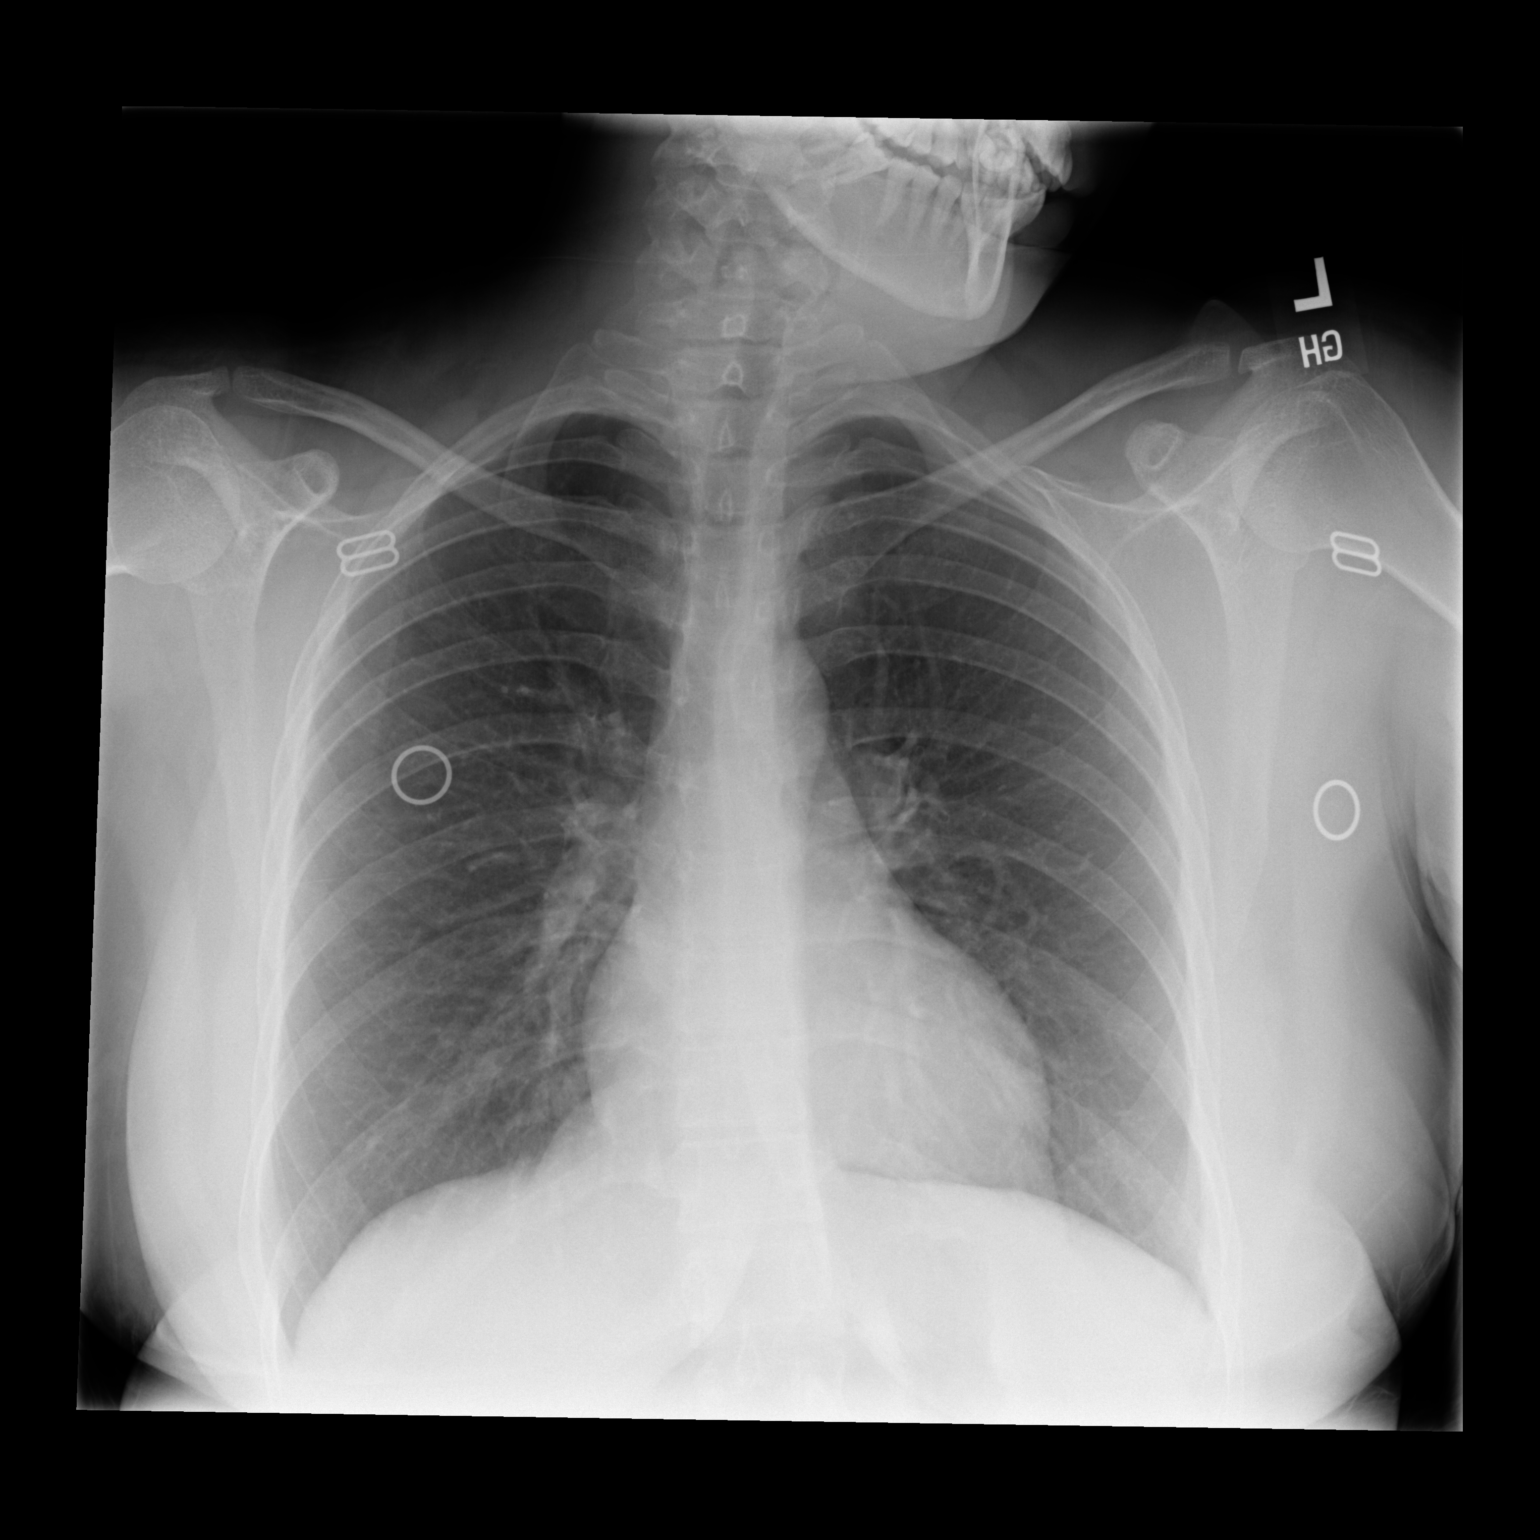

[dg chest 2 view (2 of 2)]
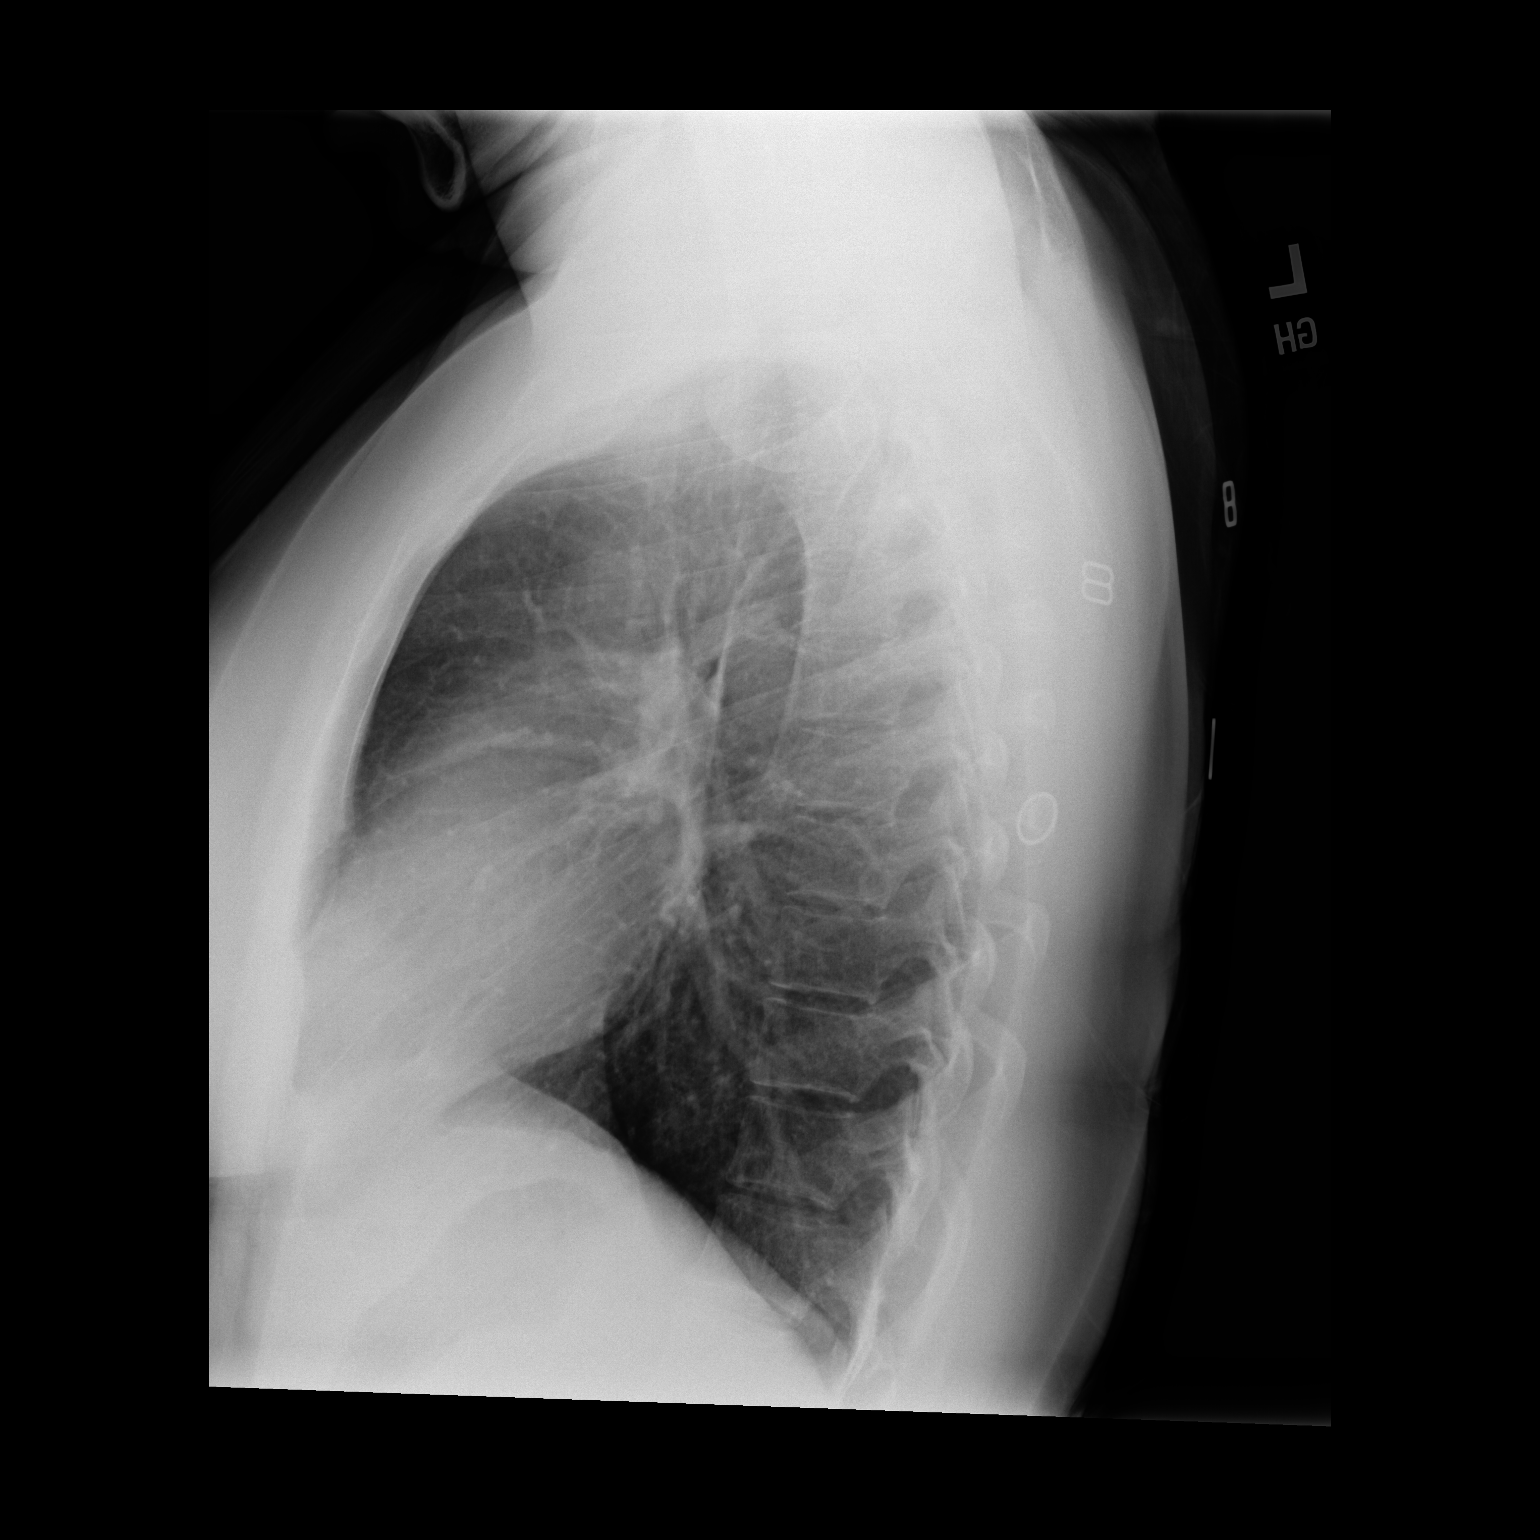

[2 of 2 positions shown; findings below may reference images not displayed]

FINDINGS: Check shadow is within normal limits. The lungs are clear
bilaterally. No focal infiltrate or effusion is seen. No bony
abnormality is noted.
IMPRESSION: No acute abnormality seen.  No findings to suggest tuberculosis

## 2021-08-31 MED ORDER — BENZONATATE 100 MG PO CAPS: 100.0000 mg | ORAL_CAPSULE | Freq: Two times a day (BID) | ORAL | 0 refills | Status: DC | PRN

## 2021-08-31 NOTE — Telephone Encounter (Signed)
Called patient to discuss worsening cough. No fever. No shortness of breath, chest pain. Says she has "phlegm and a little blood" with her cough.  ? ?I explained that I am unable to examine her over the phone, but did send in for 4 days of benzonatate to her pharmacy which she says helped "a little" since her last appointment. ? ?She is scheduled for an appointment on 09/04/21 in the clinic, but I advised her that if in the meantime she develops dizziness, lightheadedness, chest pain, difficulty breathing then she should go to the ER for urgent care. Patient understood. ? ?Used [employer] interpreter for entirety of phone call. ?

## 2021-08-31 NOTE — Telephone Encounter (Signed)
Noted and agree. 

## 2021-09-04 ENCOUNTER — Ambulatory Visit (INDEPENDENT_AMBULATORY_CARE_PROVIDER_SITE_OTHER): Payer: Medicaid Other | Admitting: Family Medicine

## 2021-09-04 ENCOUNTER — Encounter: Payer: Self-pay | Admitting: Student

## 2021-09-04 ENCOUNTER — Other Ambulatory Visit: Payer: Self-pay

## 2021-09-04 ENCOUNTER — Telehealth: Payer: Self-pay | Admitting: Student

## 2021-09-04 VITALS — BP 108/57 | HR 71 | Wt 171.8 lb

## 2021-09-04 DIAGNOSIS — G479 Sleep disorder, unspecified: Secondary | ICD-10-CM

## 2021-09-04 DIAGNOSIS — Z9889 Other specified postprocedural states: Secondary | ICD-10-CM | POA: Diagnosis not present

## 2021-09-04 DIAGNOSIS — M24541 Contracture, right hand: Secondary | ICD-10-CM | POA: Insufficient documentation

## 2021-09-04 DIAGNOSIS — R519 Headache, unspecified: Secondary | ICD-10-CM

## 2021-09-04 DIAGNOSIS — R051 Acute cough: Secondary | ICD-10-CM | POA: Diagnosis not present

## 2021-09-04 MED ORDER — FLUTICASONE PROPIONATE 50 MCG/ACT NA SUSP: 2.0000 | Freq: Every day | NASAL | 6 refills | Status: DC

## 2021-09-04 MED ORDER — AMOXICILLIN-POT CLAVULANATE 875-125 MG PO TABS: 1.0000 | ORAL_TABLET | Freq: Two times a day (BID) | ORAL | 0 refills | Status: DC

## 2021-09-04 NOTE — Assessment & Plan Note (Deleted)
Given the patient's significant hand motion impairment and its negative effect on her life, helping the patient regain some use of her hand is crucial to improving her quality of life. 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 Referred patient to occupational therapy for tertiary prevention and to orthopedic surgery for evaluation and possible surgical intervention. ?

## 2021-09-04 NOTE — Assessment & Plan Note (Addendum)
Deferred to address other concerns and to be addressed at next appointment. ?

## 2021-09-04 NOTE — Assessment & Plan Note (Signed)
The likely cause of the insomnia is a combination of factors. Differential includes adjustment disorder due to changing environment and persistent economic stressors. Discussed good sleep hygiene and recommended OTC melatonin 2-3 hours before sleep. Discontinued amitriptyline due to dry throat side effect and lack of results. Consider further pharmacological interventions if other treatment does not improve psychological state of mind. Discuss counseling and therapy at next appointment. ?

## 2021-09-04 NOTE — Assessment & Plan Note (Signed)
>>  ASSESSMENT AND PLAN FOR DIFFICULTY SLEEPING WRITTEN ON 09/04/2021  6:56 PM BY SHITAREV, DIMITRY, MEDICAL STUDENT  The likely cause of the insomnia is a combination of factors. Differential includes adjustment disorder due to changing environment and persistent economic stressors. Discussed good sleep hygiene and recommended OTC melatonin 2-3 hours before sleep. Discontinued amitriptyline due to dry throat side effect and lack of results. Consider further pharmacological interventions if other treatment does not improve psychological state of mind. Discuss counseling and therapy at next appointment.

## 2021-09-04 NOTE — Assessment & Plan Note (Signed)
Given the patient's significant hand motion impairment and its negative effect on her life, helping the patient regain some use of her hand is crucial to improving her quality of life. 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 Referred patient to occupational therapy for tertiary prevention and to orthopedic surgery for evaluation and possible surgical intervention. ?

## 2021-09-04 NOTE — Progress Notes (Signed)
? ? ?SUBJECTIVE:  ? ?Diana Hopkins is a 26 y.o. female with a PMH of hand surgery, insomnia, and frontal headaches presenting to the clinic for follow-up about sleep difficulty and persistent cough. ? ?CHIEF COMPLAINT / HPI:  ? ?Acute cough ?The patient has had a cough for the past month. It has gotten a bit worse since it started, with 3-4 occasions of hemoptysis since the beginning. She had a normal CXR 2 weeks ago. The patient occasionally coughs up some transparent phlegm when coughing. She reports that the cough keeps her from sleeping sometimes. She notes a history of "winter chest allergies" in her family. She only coughed a few times during the appointment. The patient was prescribed benzonatate for treatment previously and it has not helped much. She is not sure if the cough is more from her chest or her throat, but her throat does feel dry and uncomfortable. ? ?H/O hand surgery ?The patient fell and broke her right elbow as a kid in [place]. The injury required 5 corrective surgeries and she has lost most mobility in her right hand. She is unable to do most everyday tasks with this hand and it shakes if she tries to write with a pen. She indicates that her hand is usually frozen in a claw-like position. ? ?She states that her hand's limited ability has prevented her from finding a job and it causes her a lot of stress. She was hoping that she could get curative treatment in [place]. ? ?Severe frontal headaches ?The patient states that she has been getting unilateral headaches for several months. The pain is a 6/10 and localized behind the eye. She also associates a feeling of eye pressure with the pain. ? ?Sleeping difficulty ?The patient has been having difficulties sleeping for the past few months. She was prescribed amitriptyline on 03/09 and has been taking it consistently, but it has not helped her sleep. She endorses significant life stressors and economic difficulties ("I do not have even 10  dollars to spend on myself"). She denies daytime sleepiness, use of other medications, excessive caffeine intake, or abnormal snoring. ? ?PERTINENT  PMH: ?The patient has a history of "winter chest allergies" in her family. ?She had a series of surgeries on her elbow and arm as a child following a traumatic fracture due to a fall. ? ?PSH: ?The patient states that she is not doing well economically at home and that she is deeply stressed about her financial situation. She is unable to work because she does not speak [place] and her limited hand functioning keeps her from being hired for manual jobs. She was told before coming to the [place] that all of her issues would be addressed and fixed, giving her unrealistic expectations for her future here. The disappointment and difficulties have been difficult to handle and she is understandably unhappy. ? ?OBJECTIVE:  ? ?BP (!) 108/57   Pulse 71   Wt 171 lb 12.8 oz (77.9 kg)   SpO2 98%   BMI 31.42 kg/m?   ?General: Age-appropriate, resting comfortably in chair, NAD, WNWD, alert and at baseline ?HEENT: NCAT. PERRLA. Sclera without injection or icterus. MMM. Sinuses not TTP. ?Cardiovascular: Regular rate and rhythm. Normal S1/S2. No murmurs, rubs, or gallops appreciated. 2+ radial pulses. ?Pulmonary: Clear bilaterally to ascultation. No increased WOB, no accessory muscle usage. No wheezes, rales, or crackles. ?Extremities: No edema. Skin warm to touch. ?Psych: Pleasant and appropriate. ? ?ASSESSMENT/PLAN:  ? ?Diana Hopkins is a 26 y.o. female with a  PMH of hand surgery, insomnia, and frontal headaches presenting to the clinic for follow-up about sleep difficulty and persistent cough with no notable findings on physical exam and a recent normal CXR. ? ?Severe frontal headaches ?Deferred to address other concerns and to be addressed at next appointment. ? ?Sleeping difficulty ?The likely cause of the insomnia is a combination of factors. Differential includes adjustment disorder  due to changing environment and persistent economic stressors. Discussed good sleep hygiene and recommended OTC melatonin 2-3 hours before sleep. Discontinued amitriptyline due to dry throat side effect and lack of results. Consider further pharmacological interventions if other treatment does not improve psychological state of mind. Discuss counseling and therapy at next appointment. ? ?Cough ?Differential includes sinus infection, seasonal allergies, or somatization due to psychological stressors. Normal CXR is reassuring for lack of TB or severe lung tissue disease. Prescribed fluticasone to reduce postnasal drip and alleviate cough symptoms. Prescribed Augmentin course d/t suspicion of sinus infection. Discontinued benzonatate due to lack of symptomatic relief. ? ?H/O hand surgery ?Given the patient's significant hand motion impairment and its negative effect on her life, helping the patient regain some use of her hand is crucial to improving her quality of life. 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 Referred patient to occupational therapy for tertiary prevention and to orthopedic surgery for evaluation and possible surgical intervention. ? ? ?[facility] doctor, Medical Student ?[facility]  ? ?

## 2021-09-04 NOTE — Patient Instructions (Addendum)
It was good seeing you today. We are sorry to hear about all the difficulties you have been having. We hope to help you with some of them over time. We would like to see you one month from now. We will follow up about your cough, about your sleep, and discuss your headaches. ? ?Dr. Leveda Anna has referred you to the orthopedic surgeon for your arm. Please look out for a phone call about that. Dr. Leveda Anna has also referred you to the occupational therapy for more follow-up. ? ?You can stop taking the medication you have. It may not be the best medication for you. Instead, we have prescribed you some nasal spray and some antibiotics to help your sinuses. You can try over the counter melatonin to help with sleep a bit. ? ?Please do not hesitate to contact us if you have more questions. ?

## 2021-09-04 NOTE — Telephone Encounter (Signed)
Attempted to call patient with assistance of [name] for negative chest xray results. Will send letter and have patient follow up with me. Upon review, it appears that she has cancelled her upcoming appt on 09/07/21. Will try to have her appointment rescheduled as well for further evaluation of cough. ? ? ?Alfredo Martinez, MD ?

## 2021-09-04 NOTE — Assessment & Plan Note (Addendum)
Differential includes sinus infection, seasonal allergies, or somatization due to psychological stressors. Normal CXR is reassuring for lack of TB or severe lung tissue disease. Prescribed fluticasone to reduce postnasal drip and alleviate cough symptoms. Prescribed Augmentin course d/t suspicion of sinus infection. Discontinued benzonatate due to lack of symptomatic relief. ?

## 2021-09-04 NOTE — Assessment & Plan Note (Signed)
>>  ASSESSMENT AND PLAN FOR SEVERE FRONTAL HEADACHES WRITTEN ON 09/04/2021  6:52 PM BY SHITAREV, DIMITRY, MEDICAL STUDENT  Deferred to address other concerns and to be addressed at next appointment.

## 2021-09-05 ENCOUNTER — Encounter: Payer: Self-pay | Admitting: Family Medicine

## 2021-09-05 NOTE — Progress Notes (Signed)
What began as a simple visit quickly devolved into a complex interplay of mind body concerns.   ? ?I will start with the big picture that Diana Hopkins came to the [place] with high expectations.  Life would be good.  She would be given money not only for herself but also to send back to her family.  [place] doctors would fix her right arm, injured as a child.  Non of that has happened.  Patient is sad, broke and only has a limited support system (caveat: all of this is the patient's perspective.)  Life is very hard for her here. ? ?Specific issues: ?Cough x 1 month.  Recent normal CXR.  Lungs clear.  Will treat both as sinusitis and allergies.  Post viral cough likely playing a role. ?Right arm dysfunction.  Patient had a fall as a child, broke something (suspect supracondylar fracture) and then had complications (Suspect Volkman's ischemic contracture.)  Has had six surgeries.  Still with significant dysfunction.  Will refer for OT and for ortho consult.   ?Frequent HAs - sound like migraines  likely would benefit from tryptan and from preventive ?Depression.  On Zoloft.  Counseling likely difficult with language barrier.   ?Will follow up in one month.  Hopefully cough will be resolved and we will have made progress with the right arm dysfunction.  Perhaps we can focus on migraines and depression at next visit.   ?

## 2021-09-07 ENCOUNTER — Ambulatory Visit: Payer: Medicaid Other | Admitting: Student

## 2021-09-09 DIAGNOSIS — Z419 Encounter for procedure for purposes other than remedying health state, unspecified: Secondary | ICD-10-CM | POA: Diagnosis not present

## 2021-09-13 ENCOUNTER — Other Ambulatory Visit: Payer: Self-pay | Admitting: Student

## 2021-09-19 ENCOUNTER — Ambulatory Visit (INDEPENDENT_AMBULATORY_CARE_PROVIDER_SITE_OTHER): Payer: Medicaid Other

## 2021-09-19 ENCOUNTER — Ambulatory Visit (INDEPENDENT_AMBULATORY_CARE_PROVIDER_SITE_OTHER): Payer: Medicaid Other | Admitting: Orthopedic Surgery

## 2021-09-19 ENCOUNTER — Encounter: Payer: Self-pay | Admitting: Orthopedic Surgery

## 2021-09-19 VITALS — BP 108/75 | HR 76 | Ht 62.0 in | Wt 172.0 lb

## 2021-09-19 DIAGNOSIS — M79639 Pain in unspecified forearm: Secondary | ICD-10-CM

## 2021-09-19 DIAGNOSIS — M79631 Pain in right forearm: Secondary | ICD-10-CM

## 2021-09-19 DIAGNOSIS — M19029 Primary osteoarthritis, unspecified elbow: Secondary | ICD-10-CM | POA: Diagnosis not present

## 2021-09-19 DIAGNOSIS — M24541 Contracture, right hand: Secondary | ICD-10-CM

## 2021-09-19 HISTORY — DX: Pain in unspecified forearm: M79.639

## 2021-09-19 NOTE — Progress Notes (Signed)
? ?Office Visit Note ?  ?Patient: Diana Hopkins           ?Date of Birth: [birth year]           ?MRN: [id number] ?Visit Date: 09/19/2021 ?             ?Requested by: Moses Manners, MD ?[address] ?[place],  [place] [address] ?PCP: Maury Dus, MD ? ? ?Assessment & Plan: ?Visit Diagnoses:  ?1. Right forearm pain   ?2. Contracture of right hand   ?3. Elbow arthritis   ?4. Pain of right forearm   ? ? ?Plan: Discussed with patient that she has a complex issue involving his right upper extremity.  She had a significant injury to her elbow as a child with multiple subsequent surgeries.  She now has an extrinsic flexor contracture which has limited her hand function.  She also has limited range of motion at the forearm and wrist likely secondary to the radial shortening with posttraumatic elbow arthritis and likely chronic injury of the interosseous membrane.  Her arm is notably shorter than the contralateral side.  Discussed that her multiple complex upper extremity issues would be better served at an [employer].  I would like to refer her to [facility] for further evaluation.  She would like to work with hand therapy to work on strengthening of this hand in the meantime.  Discussed that I am not sure how much beneficial hand therapy will be but I am certainly happy to provide a referral.  She would also like to work with the same interpreted with therapy and at [facility].  The interpreter's name is Mahin Taremian.  ? ?Follow-Up Instructions: No follow-ups on file.  ? ?Orders:  ?Orders Placed This Encounter  ?Procedures  ? XR Forearm Right  ? XR Elbow 2 Views Right  ? XR Wrist Complete Right  ? ?No orders of the defined types were placed in this encounter. ? ? ? ? Procedures: ?No procedures performed ? ? ?Clinical Data: ?No additional findings. ? ? ?Subjective: ?Chief Complaint  ?Patient presents with  ? Right Forearm - Pain  ? ? ?Is a 26 year old left-hand-dominant female who presents with  multiple issues involving the right upper extremity.  This began when she was 26 years old.  She notes that she had a crush injury to her elbow.  She subsequently had 4 surgeries including irrigation and debridement for postoperative infection.  Last surgery was around 15 years ago.  She was living in [place] at the time but had to travel to [place] for this last surgery.  She notes that she has difficulty in using her hand.  She is unable to grab or grasp things.  She has limited range of motion at the wrist and forearm.  She has children and has difficulty caring for them secondary to limited hand function.  She also has difficulty with other activities such as eating, and hygiene.  She notes occasional numbness in all of her fingers. ? ? ?Review of Systems ? ? ?Objective: ?Vital Signs: BP 108/75 (BP Location: Left Arm, Patient Position: Sitting)   Pulse 76   Ht 5\' 2"  (1.575 m)   Wt 172 lb (78 kg)   BMI 31.46 kg/m?  ? ?Physical Exam ?Constitutional:   ?   Appearance: Normal appearance.  ?Cardiovascular:  ?   Rate and Rhythm: Normal rate.  ?   Pulses: Normal pulses.  ?Pulmonary:  ?   Effort: Pulmonary effort is normal.  ?  Skin: ?   General: Skin is warm and dry.  ?   Capillary Refill: Capillary refill takes less than 2 seconds.  ?Neurological:  ?   Mental Status: She is alert.  ? ? ?Right Hand Exam  ? ?Tenderness  ?Right hand tenderness location: Mildly TTP at volar forearm in area of previous incision. ? ?Other  ?Erythema: absent ?Sensation: normal ?Pulse: present ? ?Comments:  Forearm is notably shorter than the contralateral side.  Wrist ROM 30/50.  Able to fully supinate forearm but only able to pronate to just shy of neutral.  Mechanical block to further pronation.  Unable to make complete fist and lacks ~2cm from [facility] with index and middle fingers.  Contractures of thumb IP, index PIP and DIP, middle and ring DIP, and small PIP and DIP joints that is passively correctable w/ flexion at wrist.  Longitudinal incision on volar forearm and around carpal tunnel.  Sensation intact to light tough and symmetric to contralateral hand.  ? ? ?Right Elbow Exam  ? ?Tenderness  ?The patient is experiencing no tenderness.  ? ?Range of Motion  ?Pronation:  abnormal  ?Supination:  normal  ? ?Other  ?Erythema: absent ?Sensation: normal ?Pulse: present ? ?Comments:  Multiple scars at posterior aspect of arm and elbow.   ? ? ? ? ?Specialty Comments:  ?No specialty comments available. ? ?Imaging: ?No results found. ? ? ?PMFS History: ?Patient Active Problem List  ? Diagnosis Date Noted  ? 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  ? Forearm pain 09/19/2021  ? Contracture of right hand 09/04/2021  ? Sleeping difficulty 08/17/2021  ? Cough 08/17/2021  ? Dyshidrotic eczema 08/11/2021  ? Gastroesophageal reflux disease 08/11/2021  ? Depressed mood 03/11/2021  ? Severe frontal headaches 02/07/2021  ? Varicose veins of both legs with edema 08/22/2020  ? H/O hand surgery 05/23/2020  ? ?Past Medical History:  ?Diagnosis Date  ? Cholestasis during pregnancy in third trimester   ? Medical history non-contributory   ?  ?Family History  ?Problem Relation Age of Onset  ? Hepatitis B Father   ?  ?Past Surgical History:  ?Procedure Laterality Date  ? arm surgery    ? FRACTURE SURGERY    ? HAND SURGERY    ? ?Social History  ? ?Occupational History  ? Not on file  ?Tobacco Use  ? Smoking status: Never  ? Smokeless tobacco: Never  ?Vaping Use  ? Vaping Use: Never used  ?Substance and Sexual Activity  ? Alcohol use: Never  ? Drug use: Never  ? Sexual activity: Yes  ?  Birth control/protection: None  ? ? ? ? ? ? ?

## 2021-09-21 ENCOUNTER — Other Ambulatory Visit: Payer: Self-pay

## 2021-09-21 ENCOUNTER — Emergency Department (HOSPITAL_COMMUNITY)
Admission: EM | Admit: 2021-09-21 | Discharge: 2021-09-21 | Disposition: A | Discharge status: Home / Self Care | Payer: Medicaid Other | Attending: Emergency Medicine | Admitting: Emergency Medicine

## 2021-09-21 ENCOUNTER — Encounter (HOSPITAL_COMMUNITY): Payer: Self-pay

## 2021-09-21 DIAGNOSIS — Z5321 Procedure and treatment not carried out due to patient leaving prior to being seen by health care provider: Secondary | ICD-10-CM | POA: Diagnosis not present

## 2021-09-21 DIAGNOSIS — R000 Tachycardia, unspecified: Secondary | ICD-10-CM | POA: Insufficient documentation

## 2021-09-21 DIAGNOSIS — F410 Panic disorder [episodic paroxysmal anxiety] without agoraphobia: Secondary | ICD-10-CM | POA: Diagnosis not present

## 2021-09-21 DIAGNOSIS — R0602 Shortness of breath: Secondary | ICD-10-CM | POA: Diagnosis not present

## 2021-09-21 DIAGNOSIS — R531 Weakness: Secondary | ICD-10-CM | POA: Diagnosis not present

## 2021-09-21 LAB — CBC
HCT: 41.2 % (ref 36.0–46.0)
Hemoglobin: 13.5 g/dL (ref 12.0–15.0)
MCH: 28.1 pg (ref 26.0–34.0)
MCHC: 32.8 g/dL (ref 30.0–36.0)
MCV: 85.8 fL (ref 80.0–100.0)
Platelets: 242 10*3/uL (ref 150–400)
RBC: 4.8 MIL/uL (ref 3.87–5.11)
RDW: 13.4 % (ref 11.5–15.5)
WBC: 5.4 10*3/uL (ref 4.0–10.5)
nRBC: 0 % (ref 0.0–0.2)

## 2021-09-21 LAB — BASIC METABOLIC PANEL
Anion gap: 8 (ref 5–15)
BUN: 8 mg/dL (ref 6–20)
CO2: 20 mmol/L — ABNORMAL LOW (ref 22–32)
Calcium: 9.7 mg/dL (ref 8.9–10.3)
Chloride: 110 mmol/L (ref 98–111)
Creatinine, Ser: 0.53 mg/dL (ref 0.44–1.00)
GFR, Estimated: >60 mL/min (ref >60)
Glucose, Bld: 95 mg/dL (ref 70–99)
Potassium: 3.9 mmol/L (ref 3.5–5.1)
Sodium: 138 mmol/L (ref 135–145)

## 2021-09-21 LAB — I-STAT BETA HCG BLOOD, ED (MC, WL, AP ONLY): I-stat hCG, quantitative: <5 m[IU]/mL (ref <5)

## 2021-09-21 LAB — TROPONIN I (HIGH SENSITIVITY)
Troponin I (High Sensitivity): <2 ng/L (ref <18)
Troponin I (High Sensitivity): <2 ng/L (ref <18)

## 2021-09-21 NOTE — ED Triage Notes (Signed)
Pt arrived for c/o panic attack. Pt has hx of same. Pt states she is sad, because she misses her family in [place] and her kids were all crying at the same time. Pt states she started crying and then had the panic attack. Pt states she feels weak, tachycardic and SOB.  ?

## 2021-09-21 NOTE — ED Notes (Signed)
Pt left. Pt advised to stay.  

## 2021-09-25 ENCOUNTER — Ambulatory Visit: Payer: Medicaid Other | Attending: Orthopedic Surgery | Admitting: Occupational Therapy

## 2021-09-25 ENCOUNTER — Encounter: Payer: Self-pay | Admitting: Occupational Therapy

## 2021-09-25 DIAGNOSIS — M6281 Muscle weakness (generalized): Secondary | ICD-10-CM | POA: Diagnosis not present

## 2021-09-25 DIAGNOSIS — R278 Other lack of coordination: Secondary | ICD-10-CM | POA: Insufficient documentation

## 2021-09-25 DIAGNOSIS — M25631 Stiffness of right wrist, not elsewhere classified: Secondary | ICD-10-CM | POA: Insufficient documentation

## 2021-09-25 NOTE — Therapy (Signed)
?OUTPATIENT OCCUPATIONAL THERAPY ORTHO EVALUATION ? ?Patient Name: Diana Hopkins ?MRN: [id number] ?DOB:04-16-1996, 26 y.o., female ?Today's Date: 09/25/2021 ? ?PCP: Maury DusWells, Ashleigh, MD ?REFERRING PROVIDER: Marlyne BeardsBenfield, Charlie, MD  ? ? OT End of Session - 09/25/21 1221   ? ? Visit Number 1   ? Number of Visits 7   ? Date for OT Re-Evaluation 11/20/21   ? Authorization Type [employer]   ? Authorization Time Period $4 copay   ? OT Start Time 1145   ? OT Stop Time 1230   ? OT Time Calculation (min) 45 min   ? Activity Tolerance Patient tolerated treatment well   ? Behavior [facility] for tasks assessed/performed   ? ?  ?  ? ?  ? ? ?Past Medical History:  ?Diagnosis Date  ? Cholestasis during pregnancy in third trimester   ? Medical history non-contributory   ? ?Past Surgical History:  ?Procedure Laterality Date  ? arm surgery    ? FRACTURE SURGERY    ? HAND SURGERY    ? ?Patient Active Problem List  ? Diagnosis Date Noted  ? 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  ? Forearm pain 09/19/2021  ? Contracture of right hand 09/04/2021  ? Sleeping difficulty 08/17/2021  ? Cough 08/17/2021  ? Dyshidrotic eczema 08/11/2021  ? Gastroesophageal reflux disease 08/11/2021  ? Depressed mood 03/11/2021  ? Severe frontal headaches 02/07/2021  ? Varicose veins of both legs with edema 08/22/2020  ? H/O hand surgery 05/23/2020  ? ? ?ONSET DATE: 09/19/2021 referral date ? ?REFERRING DIAG: M24.541 (ICD-10-CM) - Contracture of right hand  ? ?THERAPY DIAG:  ?Muscle weakness (generalized) ? ?Other lack of coordination ? ?Stiffness of right wrist, not elsewhere classified ? ?SUBJECTIVE:  ? ?SUBJECTIVE STATEMENT: ?Pt is a 26 year old female that presents to OPOT today with contracture to RUE And limitations d/t elbow injury and surgeries in childhood. Pt reports wanting the RUE to be better. Pt denies any pain and denies any recent changes in function with RUE.  ? ?Pt accompanied by: interpreter: Raynelle FanningMona ? ?PERTINENT HISTORY: Broke elbow when 26 years  old and multiple surgeries for repair with most recent approx 15 years ago ? ?PRECAUTIONS: None ? ?WEIGHT BEARING RESTRICTIONS No ? ?PAIN:  ?Are you having pain? No ? ?FALLS: Has patient fallen in last 6 months? No ? ?LIVING ENVIRONMENT: ?Lives with: lives with their family, spouse and 3 kids ?Lives in: House/apartment ?Stairs:  first floor, tub/shower combo ?Has following equipment at home: None ? ?PLOF: Vocation/Vocational requirements: Unemployed and Leisure: go to the park ? ?PATIENT GOALS "just want a positive result" "want my hands to get better" ? ?OBJECTIVE:  ? ?HAND DOMINANCE: Right - currently uses LUE since childhood ? ?ADLs: ?Overall ADLs: Independent ? ? ?FUNCTIONAL OUTCOME MEASURES: ?Quick Dash: 52.3% ? ?UE ROM    ? ?Active ROM Right ?09/25/2021 Left ?09/25/2021  ?Shoulder flexion WFL   ?Shoulder abduction    ?Shoulder adduction    ?Shoulder extension    ?Shoulder internal rotation    ?Shoulder external rotation    ?Elbow flexion    ?Elbow extension    ?Wrist flexion    ?Wrist extension 5-10   ?Wrist ulnar deviation    ?Wrist radial deviation    ?Wrist pronation limited   ?Wrist supination WFL   ?(Blank rows = not tested) ? ?Active and Passive ROM Right ?09/25/2021 Left ?09/25/2021  ?Thumb MCP (0-60)    ?Thumb IP (0-80)    ?Thumb Radial abd/add (0-55)     ?  Thumb Palmar abd/add (0-45)     ?Thumb Opposition to Small Finger     ?Index MCP (0-90) MCP WFL, PIP and DIP limited in all digits     ?Index PIP (0-100)     ?Index DIP (0-70)     ?Long MCP (0-90)     ?Long PIP (0-100)     ?Long DIP (0-70)     ?Ring MCP (0-90)     ?Ring PIP (0-100)     ?Ring DIP (0-70)     ?Little MCP (0-90)     ?Little PIP (0-100)     ?Little DIP (0-70)     ?(Blank rows = not tested) ? ? ?UE MMT:    ? ?MMT Right ?09/25/2021 Left ?09/25/2021  ?Shoulder flexion    ?Shoulder abduction    ?Shoulder adduction    ?Shoulder extension    ?Shoulder internal rotation    ?Shoulder external rotation    ?Middle trapezius    ?Lower trapezius    ?Elbow  flexion 4+/5 5/5  ?Elbow extension 4+/5 5/5  ?Wrist flexion 4+/5   ?Wrist extension 2/5   ?Wrist ulnar deviation    ?Wrist radial deviation    ?Wrist pronation    ?Wrist supination    ?(Blank rows = not tested) ? ?HAND FUNCTION: ?Grip strength: Right: 23.5 lbs; Left: 35 lbs ? ?COORDINATION: ?9 Hole Peg test: Right: 66.65 sec; Left: 20.65 sec ?Box and Blocks:  Right 51 blocks, Left 63 blocks ? ?SENSATION: ?WFL does not report any numbness or tingling ? ?EDEMA: None noted ? ?COGNITION: ?Overall cognitive status: Within functional limits for tasks assessed ? ? ? ?TODAY'S TREATMENT:  ?09/25/21 None today - evaluation only. ? ? ?PATIENT EDUCATION: ?Education details: Education on role and purpose of OT  ?Person educated: Patient ?Education method: Explanation ?Education comprehension: verbalized understanding ? ? ?HOME EXERCISE PROGRAM: ?To be established. ? ?GOALS: ?Goals reviewed with patient? No ? ?SHORT TERM GOALS: Target date: 10/16/2021 ? ?Pt will be independent with initial HEP ?Baseline: encouraged to continue stretch and function use at eval. ?Goal status: INITIAL ? ? ?LONG TERM GOALS: Target date: 11/06/2021 ? ?Pt will be independent with updated HEP. ?Baseline:  not established at eval ?Goal status: INITIAL ? ?2.  Pt will improve grip strength by 5 lbs or greater with RUE for increasing functional use in dominant hand  ?Baseline: Right: 23.5 lbs; Left: 35 lbs ?Goal status: INITIAL ? ?3.  Pt will increase fine motor coordination in RUE by improving 9 hole peg test by 5 seconds or greater.   ?Baseline: Right: 66.65 sec; Left: 20.65 sec ?Goal status: INITIAL ? ?4.  Pt will verbalize understanding of adapted strategies and/or equipment PRN to increase safety and independence with ADLs and IADLs (I.e. taking care of children, opening containers, etc) ?Baseline: have not reviewed ?Goal status: INITIAL ? ? ?ASSESSMENT: ? ?CLINICAL IMPRESSION: ?Patient is a 26 y.o. female who was seen today for occupational therapy  evaluation for Contracture of right hand. Pt presents with deficits with RUE coordination and strengthening and ROM. Skilled occupational therapy is recommended to target listed areas of deficit and increase independence with ADLs and IADLs and decrease caregiver burden.   ? ?PERFORMANCE DEFICITS in functional skills including ADLs, IADLs, coordination, dexterity, ROM, strength, flexibility, FMC, GMC, decreased knowledge of use of DME, and UE functional use. ? ?IMPAIRMENTS are limiting patient from ADLs and IADLs.  ? ?COMORBIDITIES has no other co-morbidities that affects occupational performance. Patient will benefit from skilled OT to address  above impairments and improve overall function. ? ?MODIFICATION OR ASSISTANCE TO COMPLETE EVALUATION: No modification of tasks or assist necessary to complete an evaluation. ? ?OT OCCUPATIONAL PROFILE AND HISTORY: Problem focused assessment: Including review of records relating to presenting problem. ? ?CLINICAL DECISION MAKING: LOW - limited treatment options, no task modification necessary ? ?REHAB POTENTIAL: Fair chronic injury ? ?EVALUATION COMPLEXITY: Low ? ? ? ? ? ?PLAN: ?OT FREQUENCY: 1x/week ? ?OT DURATION: 6 weeks 4 visits over 6 weeks d/t any scheduling conflicts ? ?PLANNED INTERVENTIONS: self care/ADL training, therapeutic exercise, therapeutic activity, manual therapy, passive range of motion, splinting, electrical stimulation, ultrasound, fluidotherapy, patient/family education, energy conservation, and DME and/or AE instructions ? ?RECOMMENDED OTHER SERVICES: None ? ?CONSULTED AND AGREED WITH PLAN OF CARE: Patient ? ?PLAN FOR NEXT SESSION: coordination HEP, theraputty, ultrasound? ? ? ?Junious Dresser, OT ?09/25/2021, 3:54 PM ? ? ?

## 2021-09-26 ENCOUNTER — Ambulatory Visit (INDEPENDENT_AMBULATORY_CARE_PROVIDER_SITE_OTHER): Payer: Medicaid Other | Admitting: Family Medicine

## 2021-09-26 ENCOUNTER — Encounter: Payer: Self-pay | Admitting: Family Medicine

## 2021-09-26 ENCOUNTER — Other Ambulatory Visit: Payer: Self-pay

## 2021-09-26 VITALS — BP 102/59 | HR 70 | Wt 171.2 lb

## 2021-09-26 DIAGNOSIS — R4589 Other symptoms and signs involving emotional state: Secondary | ICD-10-CM | POA: Diagnosis not present

## 2021-09-26 DIAGNOSIS — R052 Subacute cough: Secondary | ICD-10-CM | POA: Diagnosis not present

## 2021-09-26 DIAGNOSIS — F410 Panic disorder [episodic paroxysmal anxiety] without agoraphobia: Secondary | ICD-10-CM

## 2021-09-26 MED ORDER — SERTRALINE HCL 50 MG PO TABS: 50.0000 mg | ORAL_TABLET | Freq: Every day | ORAL | 0 refills | Status: DC

## 2021-09-26 MED ORDER — CETIRIZINE HCL 10 MG PO TABS: 10.0000 mg | ORAL_TABLET | Freq: Every day | ORAL | 11 refills | Status: DC

## 2021-09-26 NOTE — Progress Notes (Signed)
? ? ?  SUBJECTIVE:  ? ?CHIEF COMPLAINT / HPI:  ? ?Panic attacks; ED follow up ?Per chart review: presented to ED on 4/13 due to panic attack but left before being seen. Did have EKG, BMP, CBC, and troponins which were all normal aside from bicarb of 20 (c/w hyperventilation) ? ?Today patient states she has episodes where she feels generally weak and has bilateral upper and lower extremity numbness. She says these happen when she has a lot of stress or is feeling angry. Reports she feels like she is dying in the moment. That's why she went to the ED, but she wasn't given any type of medication, which is what she was expecting. ? ?Of note, she was previously prescribed Zoloft 50mg  due to depressed mood. However, patient has not been taking this for quite some time. Apparently she did not understand this was a long term medication. ? ? ?Cough ?Present for the past 1 month. Recently given course of Augmentin which she states didn't help. Tried Flonase once daily for a few days and didn't notice any improvement. ?Has also been advised to take omeprazole in the past for possible GERD but it does not seem she took this. She reports her cough is worse at night. No sick contacts. No occupational exposures. No difficulty breathing. No fever, congestion, or rhinorrhea.  ? ?PERTINENT  PMH / PSH: depressed mood, refugee status ? ?OBJECTIVE:  ? ?BP (!) 102/59   Pulse 70   Wt 77.7 kg   SpO2 100%   BMI 31.31 kg/m?   ?General: NAD, pleasant, able to participate in exam ?Cardiac: RRR, S1 S2 present. normal heart sounds, no murmurs. ?Respiratory: CTAB, normal effort, No wheezes, rales or rhonchi ?Extremities: no edema or cyanosis. ?Neuro: alert, no obvious focal deficits ?Psych: mildly anxious appearing ? ? ?ASSESSMENT/PLAN:  ? ?Cough ?Subacute x1 month. No improvement after course of Augmentin. Reassuringly had normal CXR recently. Etiology unclear: possible post nasal drip related to allergic rhinitis vs asthma vs GERD. ?-Trial  daily cetirizine ?-If no improvement, consider PFTs and/or trial of albuterol inhaler ? ?Depressed mood; Panic attacks ?Patient continues to endorse significant stress and depressed mood. Also experiences occasional panic attacks. Denies SI.  ?-Resume Zoloft 50mg  daily. Patient counseled on the nature of this medication as a long-term, daily medication ?-Patient would benefit from therapy but this may be difficult due to language barrier, social situation ?-Follow up in 1 month ? ?In-person [name] used for duration of encounter ? ? ? , MD ?[facility]  ?

## 2021-09-26 NOTE — Patient Instructions (Signed)
It was great to see you! ? ?Things we discussed at today's visit: ?- You have a lot of stress in your life. This seems to be contributing to your depressed mood, trouble sleeping, and headaches. We are going to re-start the medication called Sertraline (Zoloft) ?Take 1 pill (50mg ) every day. This is a medication that is meant to be taken long-term. ? ?-For your cough, start taking Cetirizine (Zyrtec) every night. If it doesn't help at all, come back in 1 month and we can do additional testing and try other medication (maybe an inhaler). ? ? ?Take care, ? ?Dr. ?[facility]  ?

## 2021-09-27 DIAGNOSIS — F410 Panic disorder [episodic paroxysmal anxiety] without agoraphobia: Secondary | ICD-10-CM | POA: Insufficient documentation

## 2021-09-27 NOTE — Assessment & Plan Note (Addendum)
Patient continues to endorse significant stress and depressed mood. Also experiences occasional panic attacks. Denies SI.  ?-Resume Zoloft 50mg  daily. Patient counseled on the nature of this medication as a long-term, daily medication ?-Would likely benefit from therapy but this may be difficult due to language barrier, social situation. ?-Follow up in 1 month ?

## 2021-09-27 NOTE — Assessment & Plan Note (Addendum)
Subacute x1 month. No improvement after course of Augmentin. Reassuringly had normal CXR recently. Etiology unclear: possible post nasal drip related to allergic rhinitis vs asthma vs GERD. ?-Trial daily cetirizine ?-If no improvement, consider PFTs and/or trial of albuterol inhaler ?

## 2021-10-09 DIAGNOSIS — Z419 Encounter for procedure for purposes other than remedying health state, unspecified: Secondary | ICD-10-CM | POA: Diagnosis not present

## 2021-10-14 ENCOUNTER — Other Ambulatory Visit: Payer: Self-pay | Admitting: Advanced Practice Midwife

## 2021-10-14 DIAGNOSIS — R519 Headache, unspecified: Secondary | ICD-10-CM

## 2021-10-16 ENCOUNTER — Ambulatory Visit: Payer: Medicaid Other | Attending: Orthopedic Surgery | Admitting: Occupational Therapy

## 2021-10-16 DIAGNOSIS — M6281 Muscle weakness (generalized): Secondary | ICD-10-CM | POA: Insufficient documentation

## 2021-10-16 DIAGNOSIS — R278 Other lack of coordination: Secondary | ICD-10-CM | POA: Insufficient documentation

## 2021-10-16 DIAGNOSIS — M25631 Stiffness of right wrist, not elsewhere classified: Secondary | ICD-10-CM | POA: Insufficient documentation

## 2021-10-23 ENCOUNTER — Encounter: Payer: Medicaid Other | Admitting: Occupational Therapy

## 2021-10-27 ENCOUNTER — Encounter: Payer: Self-pay | Admitting: Family Medicine

## 2021-10-27 ENCOUNTER — Ambulatory Visit (INDEPENDENT_AMBULATORY_CARE_PROVIDER_SITE_OTHER): Payer: Medicaid Other | Admitting: Family Medicine

## 2021-10-27 VITALS — BP 116/56 | HR 87 | Temp 98.2°F | Ht 62.0 in | Wt 174.0 lb

## 2021-10-27 DIAGNOSIS — F410 Panic disorder [episodic paroxysmal anxiety] without agoraphobia: Secondary | ICD-10-CM | POA: Diagnosis not present

## 2021-10-27 DIAGNOSIS — R4589 Other symptoms and signs involving emotional state: Secondary | ICD-10-CM

## 2021-10-27 DIAGNOSIS — R519 Headache, unspecified: Secondary | ICD-10-CM

## 2021-10-27 DIAGNOSIS — R059 Cough, unspecified: Secondary | ICD-10-CM | POA: Diagnosis not present

## 2021-10-27 MED ORDER — SERTRALINE HCL 50 MG PO TABS: 50.0000 mg | ORAL_TABLET | Freq: Every day | ORAL | 0 refills | Status: DC

## 2021-10-27 MED ORDER — IBUPROFEN 600 MG PO TABS: 600.0000 mg | ORAL_TABLET | Freq: Three times a day (TID) | ORAL | 0 refills | Status: DC | PRN

## 2021-10-27 MED ORDER — CETIRIZINE HCL 10 MG PO TABS: 10.0000 mg | ORAL_TABLET | Freq: Every day | ORAL | 11 refills | Status: DC

## 2021-10-27 NOTE — Patient Instructions (Addendum)
It was great to see you!  Your cough is likely from allergies since the cetirizine is very helpful. Continue taking this medication daily. I have sent refills to your pharmacy.  For your mood: it is very important to take your sertraline every day. If you don't notice any improvement in 4-6 weeks, we will increase the dose.  I have scheduled a therapy appointment for you at the [facility] at [address] [facility]. It's Thursday, July 27th at 10:00am.  Only take Ibuprofen when you need it for significant pain or headache. This is NOT meant to be taken every day.   Take care, Dr Anner Crete

## 2021-10-27 NOTE — Progress Notes (Signed)
    SUBJECTIVE:   CHIEF COMPLAINT / HPI:   Cough follow-up Seen one month ago for subacute cough that had been ongoing for a few months. She was started on daily cetirizine at that time. Reports this has been very helpful. However, on days that she doesn't take the Cetirizine she still coughs a lot.   Mood follow-up Patient with history of depressed mood and occasional panic attacks. She is prescribed Sertraline 50mg  daily. However, patient reports she is still only taking the sertraline "as needed". Reports having a lot of stress at home, feels unhappy overall, but states she doesn't really want to talk about it today. The interpreter notes patient has a history of pregnancy loss x3 in (at 5 months, 7 months, 8 months gestational age) and feels this might contribute. Patient denies SI.  She is interested in therapy.  PERTINENT  PMH / PSH: refugee, right hand contracture s/p multiple surgeries  OBJECTIVE:   BP (!) 116/56   Pulse 87   Temp 98.2 F (36.8 C) (Oral)   Ht 5\' 2"  (1.575 m)   Wt 174 lb (78.9 kg)   SpO2 98%   Breastfeeding No   BMI 31.83 kg/m   General: NAD, pleasant, able to participate in exam Respiratory: No respiratory distress Skin: warm and dry, no rashes noted Psych: Normal affect and mood Neuro: grossly intact  ASSESSMENT/PLAN:   Depressed mood Remains an ongoing issue. No SI. Unfortunately she is only taking her Zoloft intermittently. Reiterated importance of taking Zoloft daily. If no improvement after 4-6 weeks of consistent use, will increase dose. Called [facility] on patient's behalf and scheduled first available therapy appointment, which was July 27th at 10:00am  Cough Significantly improved with Cetirizine. Refills provided today.  Med Refills Patient requesting refills on Ibuprofen 600mg  tablets. We discussed using this only as needed for headaches and not on a daily basis for long periods of time. She verbalized understanding. Refills  sent.  , MD [facility]

## 2021-10-28 NOTE — Assessment & Plan Note (Signed)
Remains an ongoing issue. No SI. Unfortunately she is only taking her Zoloft intermittently. Reiterated importance of taking Zoloft daily. If no improvement after 4-6 weeks of consistent use, will increase dose. Called [facility] on patient's behalf and scheduled first available therapy appointment, which was July 27th at 10:00am

## 2021-10-28 NOTE — Assessment & Plan Note (Signed)
Significantly improved with Cetirizine. Refills provided today.

## 2021-10-30 ENCOUNTER — Encounter: Payer: Self-pay | Admitting: Occupational Therapy

## 2021-10-30 ENCOUNTER — Ambulatory Visit: Payer: Medicaid Other | Admitting: Occupational Therapy

## 2021-10-30 DIAGNOSIS — R278 Other lack of coordination: Secondary | ICD-10-CM | POA: Diagnosis not present

## 2021-10-30 DIAGNOSIS — M25631 Stiffness of right wrist, not elsewhere classified: Secondary | ICD-10-CM

## 2021-10-30 DIAGNOSIS — M6281 Muscle weakness (generalized): Secondary | ICD-10-CM

## 2021-10-30 NOTE — Therapy (Signed)
OUTPATIENT OCCUPATIONAL THERAPY TREATMENT NOTE   Patient Name: Diana Hopkins MRN: [id number] DOB:15-Dec-1995, 26 y.o., female Today's Date: 10/30/2021  PCP: Maury Dus, MD REFERRING PROVIDER: Marlyne Beards, MD   END OF SESSION:   OT End of Session - 10/30/21 1148     Visit Number 2    Number of Visits 7    Date for OT Re-Evaluation 11/20/21    Authorization Type [employer]    Authorization Time Period $4 copay    OT Start Time 1147    OT Stop Time 1230    OT Time Calculation (min) 43 min    Activity Tolerance Patient tolerated treatment well    Behavior During Therapy WFL for tasks assessed/performed             Past Medical History:  Diagnosis Date   Cholestasis during pregnancy in third trimester    Medical history non-contributory    Past Surgical History:  Procedure Laterality Date   arm surgery     FRACTURE SURGERY     HAND SURGERY     Patient Active Problem List   Diagnosis Date Noted   Panic attacks 09/27/2021   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   Forearm pain 09/19/2021   Contracture of right hand 09/04/2021   Sleeping difficulty 08/17/2021   Cough 08/17/2021   Dyshidrotic eczema 08/11/2021   Gastroesophageal reflux disease 08/11/2021   Depressed mood 03/11/2021   Severe frontal headaches 02/07/2021   Varicose veins of both legs with edema 08/22/2020   H/O hand surgery 05/23/2020    ONSET DATE: 09/19/2021 referral date   REFERRING DIAG: M24.541 (ICD-10-CM) - Contracture of right hand   THERAPY DIAG:  Muscle weakness (generalized)  Other lack of coordination  Stiffness of right wrist, not elsewhere classified  Rationale for Evaluation and Treatment Rehabilitation  PERTINENT HISTORY: Broke elbow when 26 years old and multiple surgeries for repair with most recent approx 15 years ago  PRECAUTIONS: none  SUBJECTIVE: Pt denies any changes and denies any pain  PAIN:  Are you having pain? No     OBJECTIVE:   TODAY'S  TREATMENT: 10/30/21 Reviewed goals.  Pt reports no difficulty with any ADLs or IADLs.   Ultrasound 0.8w/cm2, 1 mhz, 8 minutes, pulsed 20% duty cycle to RUE cubital tunnel    Yellow Theraputty HEP  Coordination HEP   PATIENT EDUCATION: Education details: Coordination HEP, Yellow theraputty Person educated: Patient Education method: Explanation, Demonstration, and Handouts Education comprehension: verbalized understanding, returned demonstration, and needs further education      HOME EXERCISE PROGRAM: 10/30/21 yellow theraputty, coordination HEP   GOALS: Goals reviewed with patient? Yes   SHORT TERM GOALS: Target date: 10/16/2021   Pt will be independent with initial HEP Baseline: encouraged to continue stretch and function use at eval. Goal status: ACHIEVED     LONG TERM GOALS: Target date: 11/06/2021   Pt will be independent with updated HEP. Baseline:  not established at eval Goal status: ONGOING   2.  Pt will improve grip strength by 5 lbs or greater with RUE for increasing functional use in dominant hand  Baseline: Right: 23.5 lbs; Left: 35 lbs Goal status: ONGOING   3.  Pt will increase fine motor coordination in RUE by improving 9 hole peg test by 5 seconds or greater.   Baseline: Right: 66.65 sec; Left: 20.65 sec Goal status: ONGOING   4.  Pt will verbalize understanding of adapted strategies and/or equipment PRN to increase safety and independence with ADLs and IADLs (  I.e. taking care of children, opening containers, etc) Baseline: have not reviewed Goal status: ONGOING     ASSESSMENT:   CLINICAL IMPRESSION: Reviewed goals with patient - verbalized understanding. Established HEPs for continued work on strengthening and coordination.    PERFORMANCE DEFICITS in functional skills including ADLs, IADLs, coordination, dexterity, ROM, strength, flexibility, FMC, GMC, decreased knowledge of use of DME, and UE functional use.   IMPAIRMENTS are limiting patient  from ADLs and IADLs.    COMORBIDITIES has no other co-morbidities that affects occupational performance. Patient will benefit from skilled OT to address above impairments and improve overall function.   MODIFICATION OR ASSISTANCE TO COMPLETE EVALUATION: No modification of tasks or assist necessary to complete an evaluation.   OT OCCUPATIONAL PROFILE AND HISTORY: Problem focused assessment: Including review of records relating to presenting problem.   CLINICAL DECISION MAKING: LOW - limited treatment options, no task modification necessary   REHAB POTENTIAL: Fair chronic injury   EVALUATION COMPLEXITY: Low           PLAN: OT FREQUENCY: 1x/week   OT DURATION: 6 weeks 4 visits over 6 weeks d/t any scheduling conflicts   PLANNED INTERVENTIONS: self care/ADL training, therapeutic exercise, therapeutic activity, manual therapy, passive range of motion, splinting, electrical stimulation, ultrasound, fluidotherapy, patient/family education, energy conservation, and DME and/or AE instructions   RECOMMENDED OTHER SERVICES: None   CONSULTED AND AGREED WITH PLAN OF CARE: Patient   PLAN FOR NEXT SESSION: review HEPs and check goals, d/c - OT explained to patient that chronic condition and established HEPs. If needed - can schedule 1 more visit by 11/20/21   Junious Dresser, OT 10/30/2021, 11:49 AM

## 2021-10-30 NOTE — Patient Instructions (Addendum)
1. Grip Strengthening (Resistive Putty)   Squeeze putty using thumb and all fingers. Repeat _20___ times. Do __2__ sessions per day.   2. Roll putty into tube on table and pinch between each finger and thumb x 10 reps each. (can do ring and small finger together)     Copyright  V[employer]. All rights reserved.      Picking up pennies Stacking pennies Flipping cards Pushing top card off with thumb Tossing ball up with Right Hand Tossing ball between 2 hnds   ??????? ??? ??????? ???? ??? ??? ??? ??? ?? ?? ?? ?? ???? ???? ???? ?? ????? ??? ????? ??? ?? ??? ???? ????? ??? ??? 2 ????

## 2021-11-07 ENCOUNTER — Encounter: Payer: Self-pay | Admitting: Occupational Therapy

## 2021-11-07 ENCOUNTER — Ambulatory Visit: Payer: Medicaid Other | Admitting: Occupational Therapy

## 2021-11-07 DIAGNOSIS — R278 Other lack of coordination: Secondary | ICD-10-CM

## 2021-11-07 DIAGNOSIS — M25631 Stiffness of right wrist, not elsewhere classified: Secondary | ICD-10-CM

## 2021-11-07 DIAGNOSIS — M6281 Muscle weakness (generalized): Secondary | ICD-10-CM

## 2021-11-07 NOTE — Therapy (Signed)
OUTPATIENT OCCUPATIONAL THERAPY TREATMENT NOTE & DISCHARGE   Patient Name: Diana Hopkins MRN: [id number] DOB:Jul 10, 1995, 26 y.o., female Today's Date: 11/07/2021  PCP: Alcus Dad, MD REFERRING PROVIDER: Sherilyn Cooter, MD   END OF SESSION:   OT End of Session - 11/07/21 1453     Visit Number 3    Number of Visits 7    Date for OT Re-Evaluation 11/20/21    Authorization Type [employer]    Authorization Time Period $4 copay    OT Start Time 1450    OT Stop Time 1530    OT Time Calculation (min) 40 min    Activity Tolerance Patient tolerated treatment well    Behavior During Therapy [facility] for tasks assessed/performed             Past Medical History:  Diagnosis Date   Cholestasis during pregnancy in third trimester    Medical history non-contributory    Past Surgical History:  Procedure Laterality Date   arm surgery     FRACTURE SURGERY     HAND SURGERY     Patient Active Problem List   Diagnosis Date Noted   Panic attacks 09/27/2021   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   Forearm pain 09/19/2021   Contracture of right hand 09/04/2021   Sleeping difficulty 08/17/2021   Cough 08/17/2021   Dyshidrotic eczema 08/11/2021   Gastroesophageal reflux disease 08/11/2021   Depressed mood 03/11/2021   Severe frontal headaches 02/07/2021   Varicose veins of both legs with edema 08/22/2020   H/O hand surgery 05/23/2020    ONSET DATE: 09/19/2021 referral date   REFERRING DIAG: M24.541 (ICD-10-CM) - Contracture of right hand   THERAPY DIAG:  Muscle weakness (generalized)  Other lack of coordination  Stiffness of right wrist, not elsewhere classified  Rationale for Evaluation and Treatment Rehabilitation  PERTINENT HISTORY: Broke elbow when 26 years old and multiple surgeries for repair with most recent approx 15 years ago  PRECAUTIONS: none  SUBJECTIVE: Pt denies any changes and denies any pain  PAIN:  Are you having pain? No     OBJECTIVE:    TODAY'S TREATMENT: 11/07/21 Pt reports no difficulty with any ADLs or IADLs.   Ultrasound 0.8w/cm2, 1 mhz, 8 minutes, pulsed 20% duty cycle to RUE cubital tunnel    Pt had questions about how to work phone - OT assisted pt with turning phone off and back on for assisting with contacting spouse. Pt with many questions re: how to find work, developing an income for her family, etc. OT advised to speak to case manager and/or NAI for resources.   OCCUPATIONAL THERAPY DISCHARGE SUMMARY  Visits from Start of Care: 3  Current functional level related to goals / functional outcomes: Pt with increased coordination and understanding of HEPs   Remaining deficits: Continued with chronic decreased strength and coordination in RUE.   Education / Equipment: HEPs for coordination and strength   Patient agrees to discharge. Patient goals were partially met. Patient is being discharged due to maximized rehab potential. .       HOME EXERCISE PROGRAM: 10/30/21 yellow theraputty, coordination HEP   GOALS: Goals reviewed with patient? Yes   SHORT TERM GOALS: Target date: 10/16/2021   Pt will be independent with initial HEP Baseline: encouraged to continue stretch and function use at eval. Goal status: ACHIEVED     LONG TERM GOALS: Target date: 11/06/2021   Pt will be independent with updated HEP. Baseline:  not established at eval Goal status: ACHIEVED  2.  Pt will improve grip strength by 5 lbs or greater with RUE for increasing functional use in dominant hand  Baseline: Right: 23.5 lbs; Left: 35 lbs Goal status: NOT MET 21.3 lbs   3.  Pt will increase fine motor coordination in RUE by improving 9 hole peg test by 5 seconds or greater.  Baseline: Right: 66.65 sec; Left: 20.65 sec Goal status: NOT MET 64.74s   4.  Pt will verbalize understanding of adapted strategies and/or equipment PRN to increase safety and independence with ADLs and IADLs (I.e. taking care of children, opening  containers, etc) Baseline: have not reviewed Goal status: PARTIALLY MET     ASSESSMENT:   CLINICAL IMPRESSION: Pt ready for discharge from OT at this time d/t frequent no-shows and difficulty with schedule and lack of progress/maximizing OT potential.    PERFORMANCE DEFICITS in functional skills including ADLs, IADLs, coordination, dexterity, ROM, strength, flexibility, FMC, GMC, decreased knowledge of use of DME, and UE functional use.   IMPAIRMENTS are limiting patient from ADLs and IADLs.    COMORBIDITIES has no other co-morbidities that affects occupational performance. Patient will benefit from skilled OT to address above impairments and improve overall function.   MODIFICATION OR ASSISTANCE TO COMPLETE EVALUATION: No modification of tasks or assist necessary to complete an evaluation.   OT OCCUPATIONAL PROFILE AND HISTORY: Problem focused assessment: Including review of records relating to presenting problem.   CLINICAL DECISION MAKING: LOW - limited treatment options, no task modification necessary   REHAB POTENTIAL: Fair chronic injury   EVALUATION COMPLEXITY: Low           PLAN: OT FREQUENCY: 1x/week   OT DURATION: 6 weeks 4 visits over 6 weeks d/t any scheduling conflicts   PLANNED INTERVENTIONS: self care/ADL training, therapeutic exercise, therapeutic activity, manual therapy, passive range of motion, splinting, electrical stimulation, ultrasound, fluidotherapy, patient/family education, energy conservation, and DME and/or AE instructions   RECOMMENDED OTHER SERVICES: None   CONSULTED AND AGREED WITH PLAN OF CARE: Patient   PLAN FOR NEXT SESSION: OT D/C   Zachery Conch, [place] 11/07/2021, 3:24 PM

## 2021-11-09 DIAGNOSIS — Z419 Encounter for procedure for purposes other than remedying health state, unspecified: Secondary | ICD-10-CM | POA: Diagnosis not present

## 2021-11-16 NOTE — Progress Notes (Unsigned)
     SUBJECTIVE:   CHIEF COMPLAINT / HPI:   Diana Hopkins is a 26 y.o. female presents for swelling in right foot  Right calf pain Pt reports right calf swelling and pain for the past 6 days.  It felt "tight" in the first 2 days. Denies erythema over the area, no recent injuries.  No hx of DVT but does have a hx of varicose veins. No active cancer, long haul flights, immobilization or recent surgery within 12 weeks. Pt has IUD in place, not on OCPs. During her pregnancy she has varicose veins which are bothersome.  Denies dyspnea chest pain, palpitations or dizziness.  [place] Office Visit from 10/27/2021 in [place]  PHQ-9 Total Score 31       PERTINENT  PMH / PSH: Varicose veins, GERD, eczema  OBJECTIVE:   BP 104/64   Pulse 92   Ht 5\' 2"  (1.575 m)   Wt 171 lb 3.2 oz (77.7 kg)   SpO2 100%   BMI 31.31 kg/m    General: Alert, no acute distress Cardio: Well-perfused Pulm: normal work of breathing Abdomen: Bowel sounds normal. Abdomen soft and non-tender.  Extremities: No peripheral edema.  Neuro: Cranial nerves grossly intact       Negative Homan's sign bilaterally. ASSESSMENT/PLAN:   Varicose veins of both legs with edema Right calf pain likely due to varicose veins however cannot rule out DVT. Wells score 2, moderate risk of DVT. DVT US: negative for DVT. Consider referral to vascular therapy if persistent symptoms.    Lattie Haw, MD PGY-3 [place]

## 2021-11-17 ENCOUNTER — Ambulatory Visit (HOSPITAL_COMMUNITY)
Admission: RE | Admit: 2021-11-17 | Discharge: 2021-11-17 | Disposition: A | Discharge status: Home / Self Care | Payer: Medicaid Other | Source: Ambulatory Visit | Attending: Family Medicine | Admitting: Family Medicine

## 2021-11-17 ENCOUNTER — Ambulatory Visit (INDEPENDENT_AMBULATORY_CARE_PROVIDER_SITE_OTHER): Payer: Medicaid Other | Admitting: Family Medicine

## 2021-11-17 VITALS — BP 104/64 | HR 92 | Ht 62.0 in | Wt 171.2 lb

## 2021-11-17 DIAGNOSIS — M79661 Pain in right lower leg: Secondary | ICD-10-CM | POA: Insufficient documentation

## 2021-11-17 DIAGNOSIS — I83893 Varicose veins of bilateral lower extremities with other complications: Secondary | ICD-10-CM

## 2021-11-17 NOTE — Patient Instructions (Addendum)
???? ?????? ?????? ?????. ??? ?? ????? ?????. ????? ?????? ??? ????. ??? ??????? ???? ???? ?? ????? ?????? ????? ???? ?? ???? ???????. ???? ???????? ??? ???????  ???? ?????? ??? ??????? ??? ??????? ?? ???? ??? ????. ??? ???? ??????? ??? ?????? ????? ??  ???? ???????? ?? ???? ??????? ??????? ????? ?? ??? ?? ????? ???????  ??? ??? ???? ?? ????? ?? ????????? ? ?? ????? ?? ??????? ??????? ??? (  [phone]) [id number].  ???? ?????????  ????? ?????  Thank you for coming to see me today. It was a pleasure. Today we discussed your leg pain.  I would like to rule out a clot in the right leg which could be causing her symptoms.  I recommend an ultrasound  Please go to the ultrasound soon as possible.  If the result is abnormal I will call you  Please follow-up with your primary care doctor if you have no improvement in symptoms  If you have any questions or concerns, please do not hesitate to call the office at [phone].  Best wishes,   Dr Allena Katz

## 2021-11-21 NOTE — Assessment & Plan Note (Addendum)
Right calf pain likely due to varicose veins however cannot rule out DVT. Wells score 2, moderate risk of DVT. DVT US: negative for DVT. Consider referral to vascular therapy if persistent symptoms.

## 2021-11-22 ENCOUNTER — Ambulatory Visit (INDEPENDENT_AMBULATORY_CARE_PROVIDER_SITE_OTHER): Payer: Medicaid Other | Admitting: Family Medicine

## 2021-11-22 ENCOUNTER — Telehealth: Payer: Self-pay

## 2021-11-22 VITALS — BP 114/71 | HR 69 | Ht 62.0 in | Wt 171.1 lb

## 2021-11-22 DIAGNOSIS — J029 Acute pharyngitis, unspecified: Secondary | ICD-10-CM | POA: Insufficient documentation

## 2021-11-22 LAB — POCT RAPID STREP A (OFFICE): Rapid Strep A Screen: NEGATIVE

## 2021-11-22 MED ORDER — PENICILLIN V POTASSIUM 500 MG PO TABS: 500.0000 mg | ORAL_TABLET | Freq: Three times a day (TID) | ORAL | 0 refills | Status: AC

## 2021-11-22 MED ORDER — MAGIC MOUTHWASH W/LIDOCAINE: 5.0000 mL | Freq: Two times a day (BID) | ORAL | 0 refills | Status: AC

## 2021-11-22 NOTE — Telephone Encounter (Signed)
Pharmacy calls nurse line requesting ratio for Lidocaine.   Please let me know percentage and I will update pharmacy.   Will forward to provider who saw patient.

## 2021-11-22 NOTE — Assessment & Plan Note (Signed)
Rapid strep negative today.  Considered viral pharyngitis versus dental abscess.  Given worsening symptoms, bilateral tender anterior lymphadenopathy, and poor dentition will treat with antibiotics.  Rx penicillin V 500 mg 3 times daily x10 days, Magic mouthwash twice daily x10 days, and OTC Tylenol as needed return precautions given, see AVS for more.

## 2021-11-22 NOTE — Patient Instructions (Signed)
I have translated the following text using Google translate.  As such, there are many errors.  I apologize for the poor written translation; however, we do not have written  translation services yet. It was wonderful to see you today. Thank you for allowing me to be a part of your care. Below is a short summary of what we discussed at your visit today: ?? ????? ??? ? ???? ????? ?? ?????? ??? ?????? ??. ?? ?? ????? ???? ???? ???? ???. ?? ? ???? ???? ??? ????? ????? ????? ?????? ?? ??????? ??? ?? ???? ? ????? ????? ??????? ?? ???. ?? ?? ??? ???? ?? ?? ?? ??? ???? ?????. ???? ?? ?? ?? ????? ????? ?? ????? ? ??????? ???? ??. ????? ? ??? ?? ????? ??  ?? ??? ?? ????? ?? ????? ?? ??? ???:  I apologize, we do not have Dari available for print. I have translated this text into Pashto.  ????? ?????? ??? ? ??? ????? ??? ?? ???. ?? ?? ??? ?? ???? ?????? ??.  Throat and dental pain Your strep throat swab came back negative.  I am treating you as though you have a bacterial throat and mouth infection.  Take the antibiotic penicillin 3 times a day every day for total of 10 days.  Use the mouthwash twice a day every day for total of 10 days.  You may take Tylenol (also called acetaminophen).  This is found over-the-counter at many pharmacies. ? ????? ?? ??? ??? ????? ? ????? ???? ????? ???? ?????. ?? ????? ?????? ??? ??? ???? ?? ???? ? ????? ?? ???? ????????? ????? ???.  ???? ?????? ?????? ??? ??? ??? ??? ? 10 ???? ????? ?????.  ? 10 ???? ????? ??? ??? ??? ??? ? ???? ????? ??????.  ???? ???? ?? ??????? ????? (? ???????????? ?? ??? ?? ??????). ?? ?? ???? ?????????? ?? ? ?????? ??? ??? ????? ????.  Husband's appointment We have made your husband's appointment for this Monday, June 19 at 1:30 PM.  Please arrive by 1:15 PM to the clinic. ? ???? ???? ??? ? ?? ??????? ? ??? ?? 19 ? ??????? ?? 1:30 ??? ????? ? ???? ?????? ????? ??. ??????? ???? ? ??????? 1:15 ?????? ?? ????.   If you have any questions or  concerns, please do not hesitate to contact us via phone or MyChart message.  ?? ???? ???? ?????? ?? ??????? ???? ??????? ???? ? ?????? ?? MyChart ????? ?? ???? ??? ??? ????? ?????.  Ezequiel Essex, MD

## 2021-11-22 NOTE — Progress Notes (Signed)
    SUBJECTIVE:   CHIEF COMPLAINT / HPI:   Sore throat, tender lymphadenopathy 1 week duration of worsening symptoms Reports sore throat, difficulty eating, tender lymphadenopathy, cough, decreased appetite, close fitting looser Denies fever, ear pain, rhinorrhea, nausea, vomiting, acid reflux Fully vaccinated, including flu and COVID Lives at home with husband and 3 children, all of whom have experienced an array of these symptoms recently Husband with similar symptoms back in May, visited UC, strep negative, did very well with IM decadron; patient requesting similar shot today Patient went to dentist just yesterday, was told she needs 7 fillings and 12 extractions  PERTINENT  PMH / PSH: GERD, dyshidrotic eczema, 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, varicose veins of BLE, depressed mood, panic attacks, severe frontal headaches  OBJECTIVE:   BP 114/71   Pulse 69   Ht 5\' 2"  (1.575 m)   Wt 171 lb 2 oz (77.6 kg)   SpO2 100%   BMI 31.30 kg/m    Physical Exam General: Awake, alert, oriented HEENT: PERRL, bilateral TM pearly pink and flat, bilateral external auditory canals with minimal cerumen burden, no lesions, nasal mucosa slightly edematous, oral mucosa pink, moist, without lesion, erythematous posterior oropharynx, poor dentition with numerous multiple cavities Lymph: Bilateral tender lymphedema of anterior neck Cardiovascular: Regular rate and rhythm, S1 and S2 present, no murmurs auscultated Respiratory: Lung fields clear to auscultation bilaterally  ASSESSMENT/PLAN:   Sore throat Rapid strep negative today.  Considered viral pharyngitis versus dental abscess.  Given worsening symptoms, bilateral tender anterior lymphadenopathy, and poor dentition will treat with antibiotics.  Rx penicillin V 500 mg 3 times daily x10 days, Magic mouthwash twice daily x10 days, and OTC Tylenol as needed return precautions given, see AVS for more.    , MD [facility]

## 2021-11-26 NOTE — Progress Notes (Unsigned)
    SUBJECTIVE:   CHIEF COMPLAINT / HPI:   Depression Follow-Up Prescribed Zoloft 50mg  daily  PERTINENT  PMH / PSH: ***  OBJECTIVE:   There were no vitals taken for this visit.  ***  ASSESSMENT/PLAN:   No problem-specific Assessment & Plan notes found for this encounter.     , MD [facility]

## 2021-11-27 ENCOUNTER — Encounter: Payer: Self-pay | Admitting: Family Medicine

## 2021-11-27 ENCOUNTER — Ambulatory Visit (INDEPENDENT_AMBULATORY_CARE_PROVIDER_SITE_OTHER): Payer: Medicaid Other | Admitting: Family Medicine

## 2021-11-27 VITALS — BP 98/49 | HR 65 | Ht 62.0 in | Wt 171.4 lb

## 2021-11-27 DIAGNOSIS — R4589 Other symptoms and signs involving emotional state: Secondary | ICD-10-CM

## 2021-11-27 DIAGNOSIS — G479 Sleep disorder, unspecified: Secondary | ICD-10-CM | POA: Diagnosis not present

## 2021-11-27 DIAGNOSIS — R053 Chronic cough: Secondary | ICD-10-CM | POA: Diagnosis not present

## 2021-11-27 MED ORDER — CETIRIZINE HCL 10 MG PO TABS: 10.0000 mg | ORAL_TABLET | Freq: Every day | ORAL | 2 refills | Status: DC

## 2021-11-27 MED ORDER — SERTRALINE HCL 50 MG PO TABS: 50.0000 mg | ORAL_TABLET | Freq: Every day | ORAL | 0 refills | Status: DC

## 2021-11-27 MED ORDER — MELATONIN 3 MG PO TABS: 3.0000 mg | ORAL_TABLET | Freq: Every day | ORAL | 0 refills | Status: DC

## 2021-11-27 NOTE — Assessment & Plan Note (Signed)
-  Start Melatonin 3mg  nightly -Encouraged good sleep hygiene -Management of stress via medication and counseling as discussed above

## 2021-11-27 NOTE — Assessment & Plan Note (Signed)
Secondary to allergic rhinitis and post-nasal drip. Improved with daily cetirizine. Reassuringly, patient had normal CXR recently. GERD unlikely as she denies any heart-burn type symptoms, not temporally related to eating. -Refills sent for Cetirizine -Offered Flonase but patient declined

## 2021-11-27 NOTE — Patient Instructions (Addendum)
It was great to see you today!  Please bring ALL your medications to every visit.  I have sent refills on the medication for your cough.  It is called cetirizine. Continue taking it once daily in the evening.   I have also sent a new medication to help with sleep.  It is called melatonin.  Take 1 pill in the evening when you take your cough medication.  Continue taking the medication for stress.  It is called sertraline (Zoloft is the other name for it).  Take 1 pill every morning.  Take care, Dr. Anner Crete   ?? ??? ???? ??? ???? ??? ?? ??!  ??????? ???? ?? ????? ?? ??? ??? ???? ?????.  ?? ????? ? ???? ????? ?? ????? ?? ???????? ????? ??. ?? ? cetirizine ?? ??? ??????. ?? ??? ?? ?? ?? ?? ????? ?? ??????? ?? ???? ?????.  ?? ? ??? ??? ? ????? ????? ?? ??? ???? ?? ????? ??. ?? ?? melatonin ??? ????. ?? ????? ?? 1 ???? ????? ??? ?? ???? ? ???? ???? ????.  ? ???? ????? ? ????? ?????? ?? ???? ?????. ?? ?? ????????? ??? ???? (?????? ? ?? ?? ??? ??). ?? ???? 1 ???? ?????.  ???????? ????? ????

## 2021-11-27 NOTE — Assessment & Plan Note (Signed)
>>  ASSESSMENT AND PLAN FOR DIFFICULTY SLEEPING WRITTEN ON 11/27/2021  4:06 PM BY Maury Dus, MD  -Start Melatonin 3mg  nightly -Encouraged good sleep hygiene -Management of stress via medication and counseling as discussed above

## 2021-11-27 NOTE — Assessment & Plan Note (Signed)
Language barrier makes it challenging to assess extent of her mental health struggles. She endorses a lot of stress, difficulty sleeping, little energy, some feelings that she's a failure, sadness about leaving her country, but denies feeling depressed, denies SI. Historically has presented with a lot of somatic complaints. -Continue Zoloft 50mg  daily -Start melatonin 3mg  nightly to help with sleep -Consider increasing Zoloft at next visit -Has appointment to start therapy at [facility] next month

## 2021-12-07 ENCOUNTER — Ambulatory Visit (INDEPENDENT_AMBULATORY_CARE_PROVIDER_SITE_OTHER): Payer: Medicaid Other | Admitting: Family Medicine

## 2021-12-07 ENCOUNTER — Ambulatory Visit: Payer: Medicaid Other

## 2021-12-07 DIAGNOSIS — G4700 Insomnia, unspecified: Secondary | ICD-10-CM | POA: Diagnosis not present

## 2021-12-07 DIAGNOSIS — R4589 Other symptoms and signs involving emotional state: Secondary | ICD-10-CM | POA: Diagnosis not present

## 2021-12-07 DIAGNOSIS — G44209 Tension-type headache, unspecified, not intractable: Secondary | ICD-10-CM | POA: Diagnosis not present

## 2021-12-07 NOTE — Assessment & Plan Note (Signed)
-  insomnia seems secondary to depression, emphasized that treating depression will likely improve her sleeping difficulty -sleep hygiene extensively discussed -continue melatonin

## 2021-12-07 NOTE — Assessment & Plan Note (Signed)
-  reassuringly no red flag symptoms and BP appropriate, consistent with tension headache. Likely also secondary to depression and insomnia, I explained this to the patient in great detail and the importance of treating depression to resolve these other issues. She voiced understanding. -tylenol as needed for headaches

## 2021-12-07 NOTE — Assessment & Plan Note (Signed)
-  instructed to maintain compliance and take sertraline 50 mg daily as prescribed, med instructions discussed as this med can take 4-6 weeks to fully take into effect -consider referral to therapy when patient is amendable to doing so -follow up in 1 month with PCP

## 2021-12-07 NOTE — Assessment & Plan Note (Signed)
>>  ASSESSMENT AND PLAN FOR TENSION HEADACHE WRITTEN ON 12/07/2021  2:18 PM BY GANTA, ANUPA, DO  -reassuringly no red flag symptoms and BP appropriate, consistent with tension headache. Likely also secondary to depression and insomnia, I explained this to the patient in great detail and the importance of treating depression to resolve these other issues. She voiced understanding. -tylenol  as needed for headaches

## 2021-12-07 NOTE — Assessment & Plan Note (Signed)
>>  ASSESSMENT AND PLAN FOR SEVERE FRONTAL HEADACHES WRITTEN ON 01/23/2024  1:06 PM BY Michail Boyte, MD   >>ASSESSMENT AND PLAN FOR TENSION HEADACHE WRITTEN ON 12/07/2021  2:18 PM BY GANTA, ANUPA, DO  -reassuringly no red flag symptoms and BP appropriate, consistent with tension headache. Likely also secondary to depression and insomnia, I explained this to the patient in great detail and the importance of treating depression to resolve these other issues. She voiced understanding. -tylenol  as needed for headaches

## 2021-12-07 NOTE — Progress Notes (Signed)
    SUBJECTIVE:   CHIEF COMPLAINT / HPI:   Patient presents for follow up, she has been having difficulty sleeping for the past 6 months. Also reports intermittent headaches that has been ongoing for the past 6 months. Discussed this with PCP who prescribed melatonin and sertraline, reports occasional compliance. Takes it once every few days as she does not feel it helps her. Reports sleeping about 3 hours a night. Trouble with falling asleep. Started sertraline about 10 days ago. Reports depressed mood, has noticed a decrease in tolerance and patience when dealing with others. She does not like to go out. Support system includes husband. Denies thoughts of harming self or anyone else. Headaches hurt the most on the sides of her head that feels like an aching sensation. Denies vision changes, nausea or vomiting. These headaches occur 1-2 times a week. She has not seen a therapist to discuss her mood.  OBJECTIVE:   BP 116/68   Pulse 69   Ht 5\' 2"  (1.575 m)   Wt 171 lb 6 oz (77.7 kg)   SpO2 98%   BMI 31.34 kg/m   General: Patient well-appearing, in no acute distress. HEENT: PERRLA, no occipital or temporal tenderness noted on palpation  CV: RRR, no murmurs or gallops auscultated Resp: CTAB, no wheezing, rales or rhonchi noted Neuro: CN 2-12 grossly intact, gross sensation intact Psych: depressed mood, denies SI or HI   ASSESSMENT/PLAN:   Depressed mood -instructed to maintain compliance and take sertraline 50 mg daily as prescribed, med instructions discussed as this med can take 4-6 weeks to fully take into effect -consider referral to therapy when patient is amendable to doing so -follow up in 1 month with PCP   Insomnia -insomnia seems secondary to depression, emphasized that treating depression will likely improve her sleeping difficulty -sleep hygiene extensively discussed -continue melatonin   Tension headache -reassuringly no red flag symptoms and BP appropriate, consistent  with tension headache. Likely also secondary to depression and insomnia, I explained this to the patient in great detail and the importance of treating depression to resolve these other issues. She voiced understanding. -tylenol as needed for headaches    Video Dari interpretation ) utilized throughout the entirety of this encounter.   (#[id number], DO [facility]

## 2021-12-07 NOTE — Patient Instructions (Signed)
It was great seeing you today!  Today we discussed many things. It seems that your trouble sleeping and headaches are due to your mood. Please take sertraline 50 mg daily. Your headaches seem to be due to tension, please avoid stressful situations and find time to relax. You may take tylenol for your headache but this should improve as your mood and sleep improves.   For sleep, the goal is to get 7-8 hours a sleep a night. Please continue to take melatonin but I think your mood is causing your sleep issues. Make sure to avoid screen time at least 30 minutes before bedtime. Having a bedtime routine can help get your body ready for bed each night. If you are not able to fall asleep within 20-30 minutes, then please get up and do something else before returning to sleep. Reading and journaling can help as well.   Please follow up at your next scheduled appointment in 1 month, if anything arises between now and then, please don't hesitate to contact our office.   Thank you for allowing [place] to be a part of your medical care!  Thank you, Dr. Robyne Peers

## 2021-12-09 DIAGNOSIS — Z419 Encounter for procedure for purposes other than remedying health state, unspecified: Secondary | ICD-10-CM | POA: Diagnosis not present

## 2022-01-04 ENCOUNTER — Ambulatory Visit (HOSPITAL_COMMUNITY): Payer: Medicaid Other | Admitting: Clinical

## 2022-01-09 DIAGNOSIS — Z419 Encounter for procedure for purposes other than remedying health state, unspecified: Secondary | ICD-10-CM | POA: Diagnosis not present

## 2022-01-12 ENCOUNTER — Ambulatory Visit (INDEPENDENT_AMBULATORY_CARE_PROVIDER_SITE_OTHER): Payer: Medicaid Other | Admitting: Family Medicine

## 2022-01-12 VITALS — BP 101/62 | HR 75 | Ht 62.0 in | Wt 170.4 lb

## 2022-01-12 DIAGNOSIS — M79604 Pain in right leg: Secondary | ICD-10-CM

## 2022-01-12 DIAGNOSIS — R053 Chronic cough: Secondary | ICD-10-CM

## 2022-01-12 MED ORDER — CETIRIZINE HCL 10 MG PO TABS: 10.0000 mg | ORAL_TABLET | Freq: Every day | ORAL | 2 refills | Status: DC

## 2022-01-12 NOTE — Patient Instructions (Addendum)
It was great seeing you today!  You came in for refill of your medication and right leg pain. I have ordered an X ray which you can get done at the hospital.  You can take Tylenol as needed for the pain, and can alternate between ice and heat.  We will call you with anything abnormal, or we will send a letter if normal.  Feel free to call with any questions or concerns at any time, at [phone].   Take care,  Dr. Cora Collum [facility]  ????? ?? ????? ?????? ???  ??? ???? ?? ???? ???? ???? ? ??? ??? ???? ??? ?? ???. ?? ?? ???? ???? ????? ???? ?? ?? ??? ?? ?????? ?? ????????? ????? ???. ??? ?? ?????? ??????? ?? ?? ???? ???? ???? ???? ? ?? ????? ??? ?? ? ???? ??????.  ?? ??? ?? ?? ?? ??? ??? ????? ???? ??????? ? ?? ?? ?? ???? ????? ??? ???? ???.  ????? ?????? ???? ???? ?? ?? ???? ???? ?? ?????? ?? ?? ????? ?? [phone].  ????? ??? ???? ???????? ?? ??? ???? ????? ??????? ????? ???

## 2022-01-12 NOTE — Progress Notes (Signed)
    SUBJECTIVE:   CHIEF COMPLAINT / HPI:   Patient presents for chronic cough and leg pain. States if she doesn't take Zyrtec she has a lot of coughing. States she has allergies.  Would like refill of Zyrtec. Denies fever or signs of infection.   Also endorses right lower leg pain for the past 2 months. States its pain in the anterior part that hurts primarily with walking especially going up and down stairs. Denies any known injury. Would like X ray. Denies taking any medication or doing anything for the pain.   PERTINENT  PMH / PSH: Reviewed   OBJECTIVE:   BP 101/62   Pulse 75   Ht 5\' 2"  (1.575 m)   Wt 170 lb 6.4 oz (77.3 kg)   SpO2 100%   BMI 31.17 kg/m    General: alert, NAD CV: RRR no murmurs  Resp: CTAB normal WOB GI: soft, non distended MSK: no gross deformity of R knee or anterior lower leg. No swelling/effusion, erythema or bruising. Skin intact. Mild TTP along anterior tibia from mid shin to under knee. full active ROM with flexion and extension. 5/5 strength. NV intact distally. negative anterior and posterior drawer, negative Lachman's, no MCL or LCL laxity.   ASSESSMENT/PLAN:   Anterior leg pain, right 2 months in duration, worse with activity. On exam some tenderness along anterior aspect of tibia below knee to mid shin. Normal strength. Possibly shin splints or patello femoral pain syndrome. Potentially stress fracture though would expect more significant point tenderness. Will obtain X ray to rule out fracture. Discussed OTC pain medication, icing area. Discussed return precautions. Will follow up pending X ray.   Cough Presents with chronic cough consistent with her allergic rhinitis and post nasal drip. Improved with Zyrtec and refilled medication. Discussed potential additional relief with Flonase but declines. Will follow up as needed.     , DO [facility]

## 2022-01-13 DIAGNOSIS — M79604 Pain in right leg: Secondary | ICD-10-CM | POA: Insufficient documentation

## 2022-01-13 NOTE — Assessment & Plan Note (Signed)
Presents with chronic cough consistent with her allergic rhinitis and post nasal drip. Improved with Zyrtec and refilled medication. Discussed potential additional relief with Flonase but declines. Will follow up as needed.

## 2022-01-13 NOTE — Assessment & Plan Note (Addendum)
2 months in duration, worse with activity. On exam some tenderness along anterior aspect of tibia below knee to mid shin. Normal strength. Possibly shin splints or patello femoral pain syndrome. Potentially stress fracture though would expect more significant point tenderness. Will obtain X ray to rule out fracture. Discussed OTC pain medication, icing area. Discussed return precautions. Will follow up pending X ray.

## 2022-01-16 ENCOUNTER — Encounter: Payer: Self-pay | Admitting: Family Medicine

## 2022-01-16 ENCOUNTER — Ambulatory Visit (HOSPITAL_COMMUNITY)
Admission: RE | Admit: 2022-01-16 | Discharge: 2022-01-16 | Disposition: A | Discharge status: Home / Self Care | Payer: Medicaid Other | Source: Ambulatory Visit | Attending: Family Medicine | Admitting: Family Medicine

## 2022-01-16 DIAGNOSIS — M79604 Pain in right leg: Secondary | ICD-10-CM | POA: Diagnosis not present

## 2022-02-01 ENCOUNTER — Ambulatory Visit (INDEPENDENT_AMBULATORY_CARE_PROVIDER_SITE_OTHER): Payer: Medicaid Other | Admitting: Student

## 2022-02-01 ENCOUNTER — Ambulatory Visit: Payer: Medicaid Other | Admitting: Family Medicine

## 2022-02-01 ENCOUNTER — Other Ambulatory Visit: Payer: Self-pay

## 2022-02-01 ENCOUNTER — Encounter: Payer: Self-pay | Admitting: Student

## 2022-02-01 ENCOUNTER — Other Ambulatory Visit (HOSPITAL_COMMUNITY)
Admission: RE | Admit: 2022-02-01 | Discharge: 2022-02-01 | Disposition: A | Discharge status: Home / Self Care | Payer: Medicaid Other | Source: Ambulatory Visit | Attending: Family Medicine | Admitting: Family Medicine

## 2022-02-01 VITALS — BP 96/54 | HR 84 | Ht 62.0 in | Wt 171.4 lb

## 2022-02-01 DIAGNOSIS — N898 Other specified noninflammatory disorders of vagina: Secondary | ICD-10-CM

## 2022-02-01 DIAGNOSIS — Z30431 Encounter for routine checking of intrauterine contraceptive device: Secondary | ICD-10-CM

## 2022-02-01 DIAGNOSIS — Z124 Encounter for screening for malignant neoplasm of cervix: Secondary | ICD-10-CM

## 2022-02-01 NOTE — Progress Notes (Signed)
  History:  Ms. Nou Chard is a 26 y.o. [phone] who presents to clinic today for vaginal discharge, odor, pap smear and IUD strings checked. She reports that she has not had a period now for two months and she is concerned about that. She would like something to start her period. She also reports a strong odor that her husband complains about. She denies any pain with intercourse , irregular bleeding. Her discharge is yellow and white; she denies itching, burning with urination.   In person interpreter present for this visit.   The following portions of the patient's history were reviewed and updated as appropriate: allergies, current medications, family history, past medical history, social history, past surgical history and problem list.  Review of Systems:  Review of Systems  Constitutional: Negative.   HENT: Negative.    Respiratory: Negative.    Cardiovascular: Negative.   Genitourinary: Negative.   Musculoskeletal: Negative.   Skin: Negative.   Neurological: Negative.   Psychiatric/Behavioral: Negative.        Objective:  Physical Exam BP (!) 96/54   Pulse 84   Ht 5\' 2"  (1.575 m)   Wt 171 lb 6.4 oz (77.7 kg)   BMI 31.35 kg/m  Physical Exam Constitutional:      Appearance: Normal appearance.  Pulmonary:     Effort: Pulmonary effort is normal.  Abdominal:     General: Abdomen is flat.  Genitourinary:    General: Normal vulva.  Musculoskeletal:        General: Normal range of motion.  Skin:    General: Skin is warm.  Neurological:     General: No focal deficit present.     Mental Status: She is alert.   NEFG: iud string visualized; she has yellow discharge in vagina but no signs of yeast, no adnexal or tenderness over uterus   Labs and Imaging No results found for this or any previous visit (from the past 24 hour(s)).  No results found.  Health Maintenance Due  Topic Date Due   HPV VACCINES (1 - 2-dose series) Never done   PAP-Cervical Cytology Screening   Never done   PAP SMEAR-Modifier  Never done   COVID-19 Vaccine (2 - Pfizer series) 04/25/2020   INFLUENZA VACCINE  01/09/2022    Labs, imaging and previous visits in Epic and Care Everywhere reviewed  Assessment & Plan:   1. Cervical cancer screening   2. Vaginal discharge   3. IUD check up   -wet prep done today,  -pap smear done today -strings visualized, not trimmed -15 min discussion with patient around how the IUD works, why she is not getting a period, and possible explanations for vaginal discharge. Patient verbalized understanding   Approximately 30 minutes of total time was spent with this patient on exam and teaching.   03/11/2022, CNM 02/02/2022 6:38 AM

## 2022-02-01 NOTE — Progress Notes (Signed)
Pt states having vaginal discharge with odor.

## 2022-02-05 LAB — CERVICOVAGINAL ANCILLARY ONLY
Bacterial Vaginitis (gardnerella): POSITIVE — AB
Candida Glabrata: NEGATIVE
Candida Vaginitis: NEGATIVE
Chlamydia: NEGATIVE
Comment: NEGATIVE
Comment: NEGATIVE
Comment: NEGATIVE
Comment: NEGATIVE
Comment: NEGATIVE
Comment: NORMAL
Neisseria Gonorrhea: NEGATIVE
Trichomonas: NEGATIVE

## 2022-02-07 ENCOUNTER — Telehealth: Payer: Self-pay | Admitting: *Deleted

## 2022-02-07 MED ORDER — METRONIDAZOLE 500 MG PO TABS: 500.0000 mg | ORAL_TABLET | Freq: Two times a day (BID) | ORAL | 0 refills | Status: DC

## 2022-02-07 NOTE — Telephone Encounter (Addendum)
-----   Message from [name], CNM sent at 02/06/2022  7:32 PM EDT ----- Hi! This patient has BV--she is very concerned about the odor and discharge. Do you mind calling in flagyl for her and then calling her with an interpreter to tell her about the medicine and how to take it?  Thank you!   KK  8/30  1030  Called pt w/[employer] interpreter [phone] and received voicemail for her husband. Message was left stating that I am calling with non-urgent test result information for Diana Hopkins. We will call back.  Rx sent to pt's pharmacy.

## 2022-02-09 DIAGNOSIS — Z419 Encounter for procedure for purposes other than remedying health state, unspecified: Secondary | ICD-10-CM | POA: Diagnosis not present

## 2022-02-09 LAB — CYTOLOGY - PAP
Diagnosis: NEGATIVE
Diagnosis: REACTIVE

## 2022-02-14 NOTE — Telephone Encounter (Signed)
-----   Message from [name], CNM sent at 02/06/2022  7:32 PM EDT ----- Hi! This patient has BV--she is very concerned about the odor and discharge. Do you mind calling in flagyl for her and then calling her with an interpreter to tell her about the medicine and how to take it?  Thank you!    KK   8/30  1030  Called pt w/[employer] interpreter [phone] and received voicemail for her husband. Message was left stating that I am calling with non-urgent test result information for Diana Hopkins. We will call back.  Rx sent to pt's pharmacy.  --------------------------------------------------------------- 9/6 1050 Called pt with [facility] [phone] and received voicemail. Left patient a voicemail stating to call back.   Janeece Agee, RN

## 2022-02-20 ENCOUNTER — Ambulatory Visit (INDEPENDENT_AMBULATORY_CARE_PROVIDER_SITE_OTHER): Payer: Medicaid Other | Admitting: Student

## 2022-02-20 VITALS — BP 104/54 | HR 64 | Ht 62.0 in | Wt 169.4 lb

## 2022-02-20 DIAGNOSIS — R4589 Other symptoms and signs involving emotional state: Secondary | ICD-10-CM

## 2022-02-20 MED ORDER — SERTRALINE HCL 100 MG PO TABS: 100.0000 mg | ORAL_TABLET | Freq: Every day | ORAL | 1 refills | Status: DC

## 2022-02-20 NOTE — Progress Notes (Unsigned)
SUBJECTIVE:   CHIEF COMPLAINT / HPI:   Depression Seen 12/07/21 for tension HA and depression/sleep disturbance. Encourage to take Zoloft 50 mg daily, as had poor compliance.   Patient reports that she was taking the medicine (zoloft) and feeling better, but ended up having another episode (was crying a lot and feeling like dying), also doesn't have an appetite and isn't sleeping. Reports she's been taking the Zoloft daily, but it's no longer effective for her. Notes that things are getting worse and it's stressing relationship with husband. Is worried that if this continues she may end her life, but has no plans now and doesn't feel like ending her life right now. Does feel that problem is very big issue for her and hard for her to deal with. Also reports husband is not supportive/understanding and thinks that she is lying. Patient reports that her children are what keep her from attempting suicide. She knows it will negatively affect their lives. Patient also able to reach out to sister in [place], when she's struggling with SI, who provides encouragement. Patient also notes that her religion keeps her from attempting suicide.   Reports has also been having headaches with worsening depression. Reports headaches made worse by crying/depression. Also reports feeling fatigued after crying.   Also reports that things were better in [place] and she had her family and friends and support.Felt like she was always smiling and keeping other's smiling. Since being in [place], she doesn't have car to drive and family to visit. Also feels anxious and sad and isolated.    PERTINENT  PMH / PSH: Depression   OBJECTIVE:  BP (!) 104/54   Pulse 64   Ht 5\' 2"  (1.575 m)   Wt 169 lb 6 oz (76.8 kg)   SpO2 99%   BMI 30.98 kg/m   General: intermittently weepy, pleasant, able to participate in exam Neuro: alert, no obvious focal deficits, speech normal Psych: Normal affect and sad mood, not responding to  internal stimuli  ASSESSMENT/PLAN:  Depressed mood Patient reports depressed mood despite taking zoloft daily. Reports it was working once, but is not now. Patient reports poor support from husband as he doesn't belive/understand her depression. Depression makes patient cry more frequently, sleep and eat less, and have headaches. Patient reports that she's worried she may try to end her life if nothing changes, but reports no SI or plans to harm herself/others today. Patient reports her children, sister, and religion keep her from attempting suicide. Spoke with patient about increasing dose of medicine, starting therapy, and bring husband to next appointment so that provider can help him to understand depression.  -Zoloft 100 mg -Ambulatory referral to psychology for therapist -f/u 2 weeks -tylenol 500 mg PRN for HA -Provider to explain depression to husband at next appointment   Orders Placed This Encounter  Procedures   Ambulatory referral to Psychology    Referral Priority:   Routine    Referral Type:   Psychiatric    Referral Reason:   Specialty Services Required    Requested Specialty:   Psychology    Number of Visits Requested:   1   Meds ordered this encounter  Medications   sertraline (ZOLOFT) 100 MG tablet    Sig: Take 1 tablet (100 mg total) by mouth daily.    Dispense:  60 tablet    Refill:  1   acetaminophen (TYLENOL) 500 MG tablet    Sig: Take 1 tablet (500 mg total) by mouth every 6 (six)  hours as needed.    Dispense:  30 tablet    Refill:  0   No follow-ups on file. @SIGNNOTE @

## 2022-02-20 NOTE — Patient Instructions (Addendum)
It was great to see you! Thank you for allowing me to participate in your care!  It looks like your issues with sleep and headaches are coming from your depression. We will adjust your medicine and get you access to talk therapy.  Our plans for today:   - Depression  Zoloft 100 mg, take daily  Follow up appointment in 2 weeks  Bring husband to follow up, to discuss depression and headaches  - Therapy  Someone will call you, in your language, to discuss therapy options for you   Take care and seek immediate care sooner if you develop any concerns.   Dr. Bess Kinds, MD [facility]   ?? ?????? ??? ???????! ?? ????? ?? ?? ????? ????? ?? ?????? ??? ???? ??? ??????!  ?? ??? ?? ??? ?????? ???? ? ????? ??? ?? ??????? ??? ???? ?? ???. ?? ????? ??? ?? ????? ?? ???? ? ?? ??? ????? ?? ???? ?? ????? ?????? ?????? ????? ?????.  ?????? ??? ????? ??:  - ??????? ?????? 100 ???? ???? ?????? ???? ??? ?????? ???? ?????? ?? 2 ???? ???? ?? ???? ??????? ???? ??? ?? ???? ??????? ? ????? ???????  - ????? ???? ?? ???? ??? ?? ??? ???? ?? ???? ?? ?? ???? ????? ??? ?????? ??? ???? ???   ????? ????? ? ?? ???? ???? ?? ???? ?????? ????? ?? ????? ?????? ???? ?????.  ???? ??????? ????? MD ????? ??????? ?????

## 2022-02-21 MED ORDER — ACETAMINOPHEN 500 MG PO TABS
500.0000 mg | ORAL_TABLET | Freq: Four times a day (QID) | ORAL | 0 refills | Status: DC | PRN
Start: 2022-02-21 — End: 2022-06-21

## 2022-02-21 NOTE — Assessment & Plan Note (Addendum)
Patient reports depressed mood despite taking zoloft daily. Reports it was working once, but is not now. Patient reports poor support from husband as he doesn't belive/understand her depression. Depression makes patient cry more frequently, sleep and eat less, and have headaches. Patient reports that she's worried she may try to end her life if nothing changes, but reports no SI or plans to harm herself/others today. Patient reports her children, sister, and religion keep her from attempting suicide. Spoke with patient about increasing dose of medicine, starting therapy, and bring husband to next appointment so that provider can help him to understand depression.  -Zoloft 100 mg -Ambulatory referral to psychology for therapist -f/u 2 weeks -tylenol 500 mg PRN for HA -Provider to explain depression to husband at next appointment

## 2022-03-02 ENCOUNTER — Other Ambulatory Visit: Payer: Self-pay | Admitting: Family Medicine

## 2022-03-02 DIAGNOSIS — R4589 Other symptoms and signs involving emotional state: Secondary | ICD-10-CM

## 2022-03-02 NOTE — Telephone Encounter (Signed)
Refused automated refill request for sertraline 50mg . Dose recently changed to 100mg  and new Rx sent by Dr Marcina Millard last week.

## 2022-03-04 ENCOUNTER — Other Ambulatory Visit: Payer: Self-pay

## 2022-03-04 ENCOUNTER — Emergency Department (HOSPITAL_COMMUNITY): Payer: Medicaid Other

## 2022-03-04 ENCOUNTER — Encounter (HOSPITAL_COMMUNITY): Payer: Self-pay | Admitting: Emergency Medicine

## 2022-03-04 ENCOUNTER — Emergency Department (HOSPITAL_COMMUNITY)
Admission: EM | Admit: 2022-03-04 | Discharge: 2022-03-04 | Discharge status: Against Medical Advice | Payer: Medicaid Other | Attending: Emergency Medicine | Admitting: Emergency Medicine

## 2022-03-04 ENCOUNTER — Emergency Department (HOSPITAL_COMMUNITY)
Admission: EM | Admit: 2022-03-04 | Discharge: 2022-03-04 | Disposition: A | Discharge status: Home / Self Care | Payer: Medicaid Other | Source: Home / Self Care | Attending: Emergency Medicine | Admitting: Emergency Medicine

## 2022-03-04 DIAGNOSIS — Z5321 Procedure and treatment not carried out due to patient leaving prior to being seen by health care provider: Secondary | ICD-10-CM | POA: Insufficient documentation

## 2022-03-04 DIAGNOSIS — R419 Unspecified symptoms and signs involving cognitive functions and awareness: Secondary | ICD-10-CM | POA: Insufficient documentation

## 2022-03-04 DIAGNOSIS — R209 Unspecified disturbances of skin sensation: Secondary | ICD-10-CM | POA: Insufficient documentation

## 2022-03-04 DIAGNOSIS — F32A Depression, unspecified: Secondary | ICD-10-CM | POA: Diagnosis not present

## 2022-03-04 DIAGNOSIS — Y900 Blood alcohol level of less than 20 mg/100 ml: Secondary | ICD-10-CM | POA: Diagnosis not present

## 2022-03-04 DIAGNOSIS — F419 Anxiety disorder, unspecified: Secondary | ICD-10-CM | POA: Insufficient documentation

## 2022-03-04 DIAGNOSIS — R519 Headache, unspecified: Secondary | ICD-10-CM | POA: Insufficient documentation

## 2022-03-04 DIAGNOSIS — F29 Unspecified psychosis not due to a substance or known physiological condition: Secondary | ICD-10-CM | POA: Diagnosis not present

## 2022-03-04 DIAGNOSIS — Z743 Need for continuous supervision: Secondary | ICD-10-CM | POA: Diagnosis not present

## 2022-03-04 DIAGNOSIS — R55 Syncope and collapse: Secondary | ICD-10-CM | POA: Diagnosis not present

## 2022-03-04 DIAGNOSIS — R0689 Other abnormalities of breathing: Secondary | ICD-10-CM | POA: Diagnosis not present

## 2022-03-04 LAB — COMPREHENSIVE METABOLIC PANEL
ALT: 32 U/L (ref 0–44)
AST: 28 U/L (ref 15–41)
Albumin: 3.8 g/dL (ref 3.5–5.0)
Alkaline Phosphatase: 63 U/L (ref 38–126)
Anion gap: 6 (ref 5–15)
BUN: 8 mg/dL (ref 6–20)
CO2: 22 mmol/L (ref 22–32)
Calcium: 8.7 mg/dL — ABNORMAL LOW (ref 8.9–10.3)
Chloride: 111 mmol/L (ref 98–111)
Creatinine, Ser: 0.53 mg/dL (ref 0.44–1.00)
GFR, Estimated: >60 mL/min (ref >60)
Glucose, Bld: 95 mg/dL (ref 70–99)
Potassium: 3.6 mmol/L (ref 3.5–5.1)
Sodium: 139 mmol/L (ref 135–145)
Total Bilirubin: 0.5 mg/dL (ref 0.3–1.2)
Total Protein: 7 g/dL (ref 6.5–8.1)

## 2022-03-04 LAB — RAPID URINE DRUG SCREEN, HOSP PERFORMED
Amphetamines: NOT DETECTED
Barbiturates: NOT DETECTED
Benzodiazepines: NOT DETECTED
Cocaine: NOT DETECTED
Opiates: NOT DETECTED
Tetrahydrocannabinol: NOT DETECTED

## 2022-03-04 LAB — SALICYLATE LEVEL: Salicylate Lvl: <7 mg/dL — ABNORMAL LOW (ref 7.0–30.0)

## 2022-03-04 LAB — CBC
HCT: 40.8 % (ref 36.0–46.0)
Hemoglobin: 13.2 g/dL (ref 12.0–15.0)
MCH: 28.3 pg (ref 26.0–34.0)
MCHC: 32.4 g/dL (ref 30.0–36.0)
MCV: 87.6 fL (ref 80.0–100.0)
Platelets: 184 10*3/uL (ref 150–400)
RBC: 4.66 MIL/uL (ref 3.87–5.11)
RDW: 13.5 % (ref 11.5–15.5)
WBC: 4.4 10*3/uL (ref 4.0–10.5)
nRBC: 0 % (ref 0.0–0.2)

## 2022-03-04 LAB — I-STAT BETA HCG BLOOD, ED (MC, WL, AP ONLY): I-stat hCG, quantitative: <5 m[IU]/mL (ref <5)

## 2022-03-04 LAB — ETHANOL: Alcohol, Ethyl (B): <10 mg/dL (ref <10)

## 2022-03-04 LAB — ACETAMINOPHEN LEVEL: Acetaminophen (Tylenol), Serum: <10 ug/mL — ABNORMAL LOW (ref 10–30)

## 2022-03-04 MED ORDER — LORAZEPAM 0.5 MG PO TABS
0.5000 mg | ORAL_TABLET | Freq: Once | ORAL | Status: AC
  Administered 2022-03-04: 0.5 mg via ORAL
  Filled 2022-03-04: qty 1

## 2022-03-04 MED ORDER — LORAZEPAM 0.5 MG PO TABS: 0.5000 mg | ORAL_TABLET | Freq: Three times a day (TID) | ORAL | 0 refills | Status: DC | PRN

## 2022-03-04 NOTE — ED Triage Notes (Signed)
Per family patient has went unresponsive at home 5 times in past 3 days. Pt reports prior to this occuring she has numbness in fingertips, shob, and loses vision.  Hx of depression and takes anti depressants.  Husband has been gone out of town for a month and she has been home alone with 3 kids and stressed and crying a lot with headaches.  Same thing occurred 9 months ago and was prescribed meds and it helped and improved.

## 2022-03-04 NOTE — ED Notes (Signed)
Patient was placed in alcove room across from nurses station in triage. Patient must have waited for this RN to go into patient room and left premises w/ friend who was by her side.

## 2022-03-04 NOTE — Discharge Instructions (Addendum)
You were seen in the emergency department for fainting spells along with tingling in your hands and feet and clenching of your jaw.  You had blood work urinalysis and a CAT scan of your head that did not show an obvious explanation for your symptoms.  These may be related to anxiety and stress.  We are prescribing you some medication which may help.  Please contact your primary care doctor tomorrow for close follow-up.  Return to the emergency department if any worsening or concerning symptoms.  I am also giving you the contact information of the behavioral [facility] which may help you with your anxiety issues.

## 2022-03-04 NOTE — ED Triage Notes (Signed)
Patient BIB [facility] from home. Patient with a hx a depression and has been dealing with feeling hopeless, depressed. Takes antidepressents. Patient almost in a state of catatonia. Patient is feeling suicidal with no specific plan. Denies HI. VSS.

## 2022-03-04 NOTE — ED Provider Notes (Signed)
[facility]-EMERGENCY DEPT Provider Note   CSN: [id number] Arrival date & time: 03/04/22  2048     History  Chief Complaint  Patient presents with   Anxiety    Diana Hopkins is a 26 y.o. female.  She is brought in with a companion for evaluation of 1 week of having episodes of syncope.  She says this happened multiple times a day.  She is also noticed that she is having tingling in her hands and feet and sometimes her jaws clenched.  She is also is having worsening of some chronic left-sided headaches.  She said she had this before 9 months ago and was treated with anxiety medicine with improvement.  She has does endorse multiple stressors and has 3 kids at home who currently are staying with a family member.  She was at [facility] emergency department earlier today and had lab work and urinalysis done that did not show any significant findings.  The history is provided by the patient and a relative. The history is limited by a language barrier. A language interpreter was used Forensic psychologist).  Loss of Consciousness Episode history:  Multiple Most recent episode:  Today Timing:  Intermittent Progression:  Unchanged Chronicity:  New Relieved by:  Nothing Worsened by:  Nothing Ineffective treatments:  None tried Associated symptoms: difficulty breathing, headaches and shortness of breath   Associated symptoms: no chest pain        Home Medications Prior to Admission medications   Medication Sig Start Date End Date Taking? Authorizing Provider  acetaminophen (TYLENOL) 500 MG tablet Take 1 tablet (500 mg total) by mouth every 6 (six) hours as needed. 02/21/22   Bess Kinds, MD  cetirizine (ZYRTEC) 10 MG tablet Take 1 tablet (10 mg total) by mouth daily. 01/12/22   Cora Collum, DO  levonorgestrel (LILETTA, 52 MG,) 20.1 MCG/DAY IUD 1 each by Intrauterine route once.    [provider]  melatonin 3 MG TABS tablet Take 1 tablet (3 mg total) by mouth at bedtime.  11/27/21   Maury Dus, MD  metroNIDAZOLE (FLAGYL) 500 MG tablet Take 1 tablet (500 mg total) by mouth 2 (two) times daily. 02/07/22   [name], CNM  sertraline (ZOLOFT) 100 MG tablet Take 1 tablet (100 mg total) by mouth daily. 02/20/22 06/20/22  Bess Kinds, MD      Allergies    Patient has no known allergies.    Review of Systems   Review of Systems  Eyes:  Positive for visual disturbance.  Respiratory:  Positive for shortness of breath.   Cardiovascular:  Positive for syncope. Negative for chest pain.  Neurological:  Positive for numbness and headaches.    Physical Exam Updated Vital Signs BP 117/79   Pulse 65   Temp (!) 97.4 F (36.3 C) (Oral)   Resp 12   SpO2 100%  Physical Exam Vitals and nursing note reviewed.  Constitutional:      General: She is not in acute distress.    Appearance: Normal appearance. She is well-developed.  HENT:     Head: Normocephalic and atraumatic.  Eyes:     Conjunctiva/sclera: Conjunctivae normal.  Cardiovascular:     Rate and Rhythm: Normal rate and regular rhythm.     Heart sounds: No murmur heard. Pulmonary:     Effort: Pulmonary effort is normal. No respiratory distress.     Breath sounds: Normal breath sounds.  Abdominal:     Palpations: Abdomen is soft.  Tenderness: There is no abdominal tenderness.  Musculoskeletal:        General: No swelling.     Cervical back: Neck supple.  Skin:    General: Skin is warm and dry.     Capillary Refill: Capillary refill takes less than 2 seconds.  Neurological:     General: No focal deficit present.     Mental Status: She is alert.     Sensory: No sensory deficit.     Motor: No weakness.     ED Results / Procedures / Treatments   Labs (all labs ordered are listed, but only abnormal results are displayed) Labs Reviewed - No data to display  EKG EKG Interpretation  Date/Time:  Sunday March 04 2022 22:00:00 EDT Ventricular Rate:  59 PR  Interval:  145 QRS Duration: 93 QT Interval:  422 QTC Calculation: 418 R Axis:   62 Text Interpretation: Sinus rhythm No significant change since prior 4/23 Confirmed by Meridee Score [phone]) on 03/04/2022 10:05:44 PM  Radiology CT Head Wo Contrast  Result Date: 03/04/2022 CLINICAL DATA:  Five episodes of unconsciousness in the past 3 days EXAM: CT HEAD WITHOUT CONTRAST TECHNIQUE: Contiguous axial images were obtained from the base of the skull through the vertex without intravenous contrast. RADIATION DOSE REDUCTION: This exam was performed according to the departmental dose-optimization program which includes automated exposure control, adjustment of the mA and/or kV according to patient size and/or use of iterative reconstruction technique. COMPARISON:  MRI 03/02/2021 FINDINGS: Brain: No intracranial hemorrhage, mass effect, or evidence of acute infarct. No hydrocephalus. No extra-axial fluid collection. Vascular: No hyperdense vessel or unexpected calcification. Skull: No fracture or focal lesion. Sinuses/Orbits: No acute finding. Paranasal sinuses and mastoid air cells are well aerated. Other: None. IMPRESSION: Unremarkable noncontrast CT head. Electronically Signed   By: Minerva Fester M.D.   On: 03/04/2022 22:36    Procedures Procedures    Medications Ordered in ED Medications - No data to display  ED Course/ Medical Decision Making/ A&P                           Medical Decision Making Amount and/or Complexity of Data Reviewed Radiology: ordered.  Risk Prescription drug management.   This patient complains of multiple events of syncope, tingling in her hands and feet clenched jaw shortness of breath; this involves an extensive number of treatment Options and is a complaint that carries with it a high risk of complications and morbidity. The differential includes anxiety, vasovagal, metabolic derangement, arrhythmia, stroke, seizure  I ordered, reviewed and interpreted  labs, which included CBC and chemistries urinalysis pregnancy test talk screen which were done over at [facility].  All were unremarkable. I ordered medication oral Ativan and reviewed PMP when indicated. I ordered imaging studies which included CT head and I independently    visualized and interpreted imaging which showed no acute findings Additional history obtained from patient's relative Previous records obtained and reviewed in epic including multiple PCP visits which commented upon stressors for patient and her difficulty in coping with them Cardiac monitoring reviewed, normal sinus rhythm Social determinants considered, patient with multiple barriers including language and transportation needs food insecurity cultural Critical Interventions: None  After the interventions stated above, I reevaluated the patient and found patient to be well-appearing neuro intact in no distress Admission and further testing considered, no indications for admission at this time.  I did offer her consultation with behavioral health which she  declines at this time.  She would like to start on some medications to help her with her symptoms.  I reviewed her prior visits and see that she is on Zoloft but I do not see any other anxiety meds at this time.  We will give her a short course of some lorazepam although counseled will need very close follow-up with PCP and I also gave her contact information for behavioral health.  Return instructions discussed.         Final Clinical Impression(s) / ED Diagnoses Final diagnoses:  Syncope and collapse    Rx / DC Orders ED Discharge Orders          Ordered    LORazepam (ATIVAN) 0.5 MG tablet  Every 8 hours PRN        03/04/22 2311              Hayden Rasmussen, MD 03/05/22 1100

## 2022-03-05 ENCOUNTER — Telehealth: Payer: Self-pay

## 2022-03-05 NOTE — Telephone Encounter (Signed)
Transition Care Management Unsuccessful Follow-up Telephone Call  Date of discharge and from where:  03/04/2022 from [facility]  Attempts:  1st Attempt  Reason for unsuccessful TCM follow-up call:  Left voice message

## 2022-03-06 ENCOUNTER — Telehealth: Payer: Self-pay

## 2022-03-06 DIAGNOSIS — F4323 Adjustment disorder with mixed anxiety and depressed mood: Secondary | ICD-10-CM | POA: Diagnosis not present

## 2022-03-06 NOTE — Telephone Encounter (Signed)
Received call from [facility] in regards to patient.   She reports they saw her daughter today and the patient wanted to let us know she found a behavioral health provider and does not need to be seen for FU mood tomorrow.   However, patient has been experiencing swelling in both legs and feet. Denies SOB or pain at this time.  Patient will keep apt tomorrow to discuss edema.

## 2022-03-07 ENCOUNTER — Encounter: Payer: Self-pay | Admitting: Family Medicine

## 2022-03-07 ENCOUNTER — Ambulatory Visit (INDEPENDENT_AMBULATORY_CARE_PROVIDER_SITE_OTHER): Payer: Medicaid Other | Admitting: Family Medicine

## 2022-03-07 VITALS — BP 106/71 | HR 72 | Ht 62.0 in | Wt 169.2 lb

## 2022-03-07 DIAGNOSIS — R4589 Other symptoms and signs involving emotional state: Secondary | ICD-10-CM | POA: Diagnosis not present

## 2022-03-07 DIAGNOSIS — I83893 Varicose veins of bilateral lower extremities with other complications: Secondary | ICD-10-CM | POA: Diagnosis not present

## 2022-03-07 MED ORDER — MEDICAL COMPRESSION STOCKINGS MISC: 1.0000 | Freq: Every day | 1 refills | Status: DC

## 2022-03-07 NOTE — Progress Notes (Unsigned)
    SUBJECTIVE:   CHIEF COMPLAINT / HPI:   Leg Swelling Patient reports swelling in both legs that comes and goes.  She has none currently.  States that the veins bulge and look very green sometimes-this worries her.  Denies pain or redness.   "Stress", Depression Patient reports extremely high levels of stress.  States it is too much and she finds herself crying frequently.  Denies SI or thoughts of self-harm.  This has been an ongoing issue since she left [place].  Previously on sertraline 100 mg daily although did not notice improvement. Apparently saw psychiatrist yesterday who switched her to escitalopram 10 mg daily.  She was seen in the ED recently for "syncopal episodes", which were deemed panic attacks.  Was given 12 tablets of lorazepam 0.5 mg to use as needed. She is not in therapy, but was given information yesterday by her psychiatrist and plans to establish with therapist.  PERTINENT  PMH / [place]: refugee  OBJECTIVE:   BP 106/71   Pulse 72   Ht 5\' 2"  (1.575 m)   Wt 169 lb 3.2 oz (76.7 kg)   SpO2 100%   BMI 30.95 kg/m   Gen: NAD, able to participate in exam CV: RRR, normal S1/S2, no murmur Resp: Normal effort, lungs CTAB Extremities: no appreciable edema or cyanosis, no calf tenderness, varicose veins noted in bilateral lower extremities Skin: warm and dry, no rashes noted Neuro: alert, no obvious focal deficits, normal gait Psych: Anxious, appears mildly irritable but is pleasant  ASSESSMENT/PLAN:   Varicose veins of both legs with edema This is an established problem.  No concern for acute DVT.  No edema on exam today.  Reassurance provided.  Recommended compression stockings and elevation.  Depressed mood Longstanding issue that is very poorly controlled.  No SI or self-harm.  Fortunately she has recently established with psychiatry and has close follow-up with them in 2 weeks. -Continue escitalopram 10 mg daily per psychiatry -Continue lorazepam 0.5 mg as  needed for severe anxiety -Emphasized importance of behavioral therapy to complement medications.  She has resources from her psychiatrist and plans to establish -Crisis resources reviewed   Alcus Dad, MD [place]

## 2022-03-07 NOTE — Patient Instructions (Addendum)
  It was great to see you!  Continue your medications for stress. Escitalopram 10mg  every night. Lorazepam 0.5mg  as needed for severe anxiety/panic attacks  Follow up with your psychiatrist as scheduled in 2 weeks. See your therapist regularly as well.  For your leg swelling: elevate your legs as much as possible. Wear compression socks daily. The veins are called varicose veins and are not dangerous.  Take care, Dr Rock Nephew

## 2022-03-08 ENCOUNTER — Encounter (HOSPITAL_COMMUNITY): Payer: Self-pay

## 2022-03-08 ENCOUNTER — Ambulatory Visit (HOSPITAL_COMMUNITY)
Admission: EM | Admit: 2022-03-08 | Discharge: 2022-03-08 | Disposition: A | Discharge status: Home / Self Care | Payer: Medicaid Other | Attending: Sports Medicine | Admitting: Sports Medicine

## 2022-03-08 DIAGNOSIS — J029 Acute pharyngitis, unspecified: Secondary | ICD-10-CM | POA: Diagnosis not present

## 2022-03-08 DIAGNOSIS — Z1152 Encounter for screening for COVID-19: Secondary | ICD-10-CM | POA: Diagnosis not present

## 2022-03-08 LAB — POCT RAPID STREP A, ED / UC: Streptococcus, Group A Screen (Direct): NEGATIVE

## 2022-03-08 MED ORDER — OMEPRAZOLE 20 MG PO CPDR: 20.0000 mg | DELAYED_RELEASE_CAPSULE | Freq: Every day | ORAL | 0 refills | Status: DC

## 2022-03-08 MED ORDER — OMEPRAZOLE 40 MG PO CPDR: 40.0000 mg | DELAYED_RELEASE_CAPSULE | Freq: Every day | ORAL | 0 refills | Status: DC

## 2022-03-08 MED ORDER — PHENOL 1.4 % MT LIQD: 1.0000 | OROMUCOSAL | 0 refills | Status: DC | PRN

## 2022-03-08 NOTE — ED Provider Notes (Signed)
[facility]    CSN: [id number] Arrival date & time: 03/08/22  3846      History   Chief Complaint Chief Complaint  Patient presents with   Sore Throat   Cough    HPI Diana Hopkins is a 26 y.o. female.   She presents today with chief complaint of sore throat for the past couple of weeks.  Translator was used for the entirety of the visit.  She reports she has been getting a sore throat monthly for the past 3 to 5 years.  She was in the past treated with omeprazole that seemed to help her symptoms.  She notices her symptoms are worse when she eats foods like garlic and eggplant.  She denies any sneezing, runny nose or cough.  She does recall once when she had a sore throat in the past she was treated with antibiotics.  She has not tried anything today for alleviation of her symptoms but reports it was excruciating this morning and painful to swallow.  She denies any fevers, chills, chest pain, shortness of breath or sick contacts with similar symptoms.   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 Authorizing Provider  omeprazole (PRILOSEC) 20 MG capsule Take 1 capsule (20 mg total) by mouth daily. 03/08/22  Yes Gillermo Murdoch A, DO  omeprazole (PRILOSEC) 40 MG capsule Take 1 capsule (40 mg total) by mouth daily. 03/08/22  Yes Gillermo Murdoch A, DO  phenol (CHLORASEPTIC) 1.4 % LIQD Use as directed 1 spray in the mouth or throat as needed for throat irritation / pain. 03/08/22  Yes Gillermo Murdoch A, DO  acetaminophen (TYLENOL) 500 MG tablet Take 1 tablet (500 mg total) by mouth every 6 (six) hours as needed. 02/21/22   Bess Kinds, MD  cetirizine (ZYRTEC) 10 MG tablet Take 1 tablet (10 mg total) by mouth daily. 01/12/22   Cora Collum, DO  Elastic Bandages & Supports (MEDICAL COMPRESSION STOCKINGS) MISC 1 each by Does not apply route daily. 03/07/22   Maury Dus, MD  levonorgestrel (LILETTA, 52 MG,) 20.1 MCG/DAY IUD 1 each by  Intrauterine route once.    [provider]  LORazepam (ATIVAN) 0.5 MG tablet Take 1 tablet (0.5 mg total) by mouth every 8 (eight) hours as needed for anxiety. 03/04/22   Terrilee Files, MD  melatonin 3 MG TABS tablet Take 1 tablet (3 mg total) by mouth at bedtime. 11/27/21   Maury Dus, MD  metroNIDAZOLE (FLAGYL) 500 MG tablet Take 1 tablet (500 mg total) by mouth 2 (two) times daily. 02/07/22   [name], CNM  sertraline (ZOLOFT) 100 MG tablet Take 1 tablet (100 mg total) by mouth daily. 02/20/22 06/20/22  Bess Kinds, MD    Family History Family History  Problem Relation Age of Onset   Hepatitis B Father     Social History Social History   Tobacco Use   Smoking status: Never   Smokeless tobacco: Never  Vaping Use   Vaping Use: Never used  Substance Use Topics   Alcohol use: Never   Drug use: Never     Allergies   Patient has no known allergies.   Review  of Systems Review of SystemsAs listed above in HPI   Physical Exam Triage Vital Signs ED Triage Vitals [03/08/22 0957]  Enc Vitals Group     BP 106/62     Pulse Rate 69     Resp 18     Temp 97.9 F (36.6 C)     Temp Source Oral     SpO2 98 %     Weight      Height      Head Circumference      Peak Flow      Pain Score      Pain Loc      Pain Edu?      Excl. in GC?    No data found.  Updated Vital Signs BP 106/62 (BP Location: Left Arm)   Pulse 69   Temp 97.9 F (36.6 C) (Oral)   Resp 18   SpO2 98%     Physical Exam Vitals reviewed.  Constitutional:      General: She is not in acute distress.    Appearance: She is well-developed. She is not ill-appearing, toxic-appearing or diaphoretic.  HENT:     Nose: No congestion or rhinorrhea.     Mouth/Throat:     Mouth: Mucous membranes are moist. No oral lesions.     Pharynx: Uvula midline. No pharyngeal swelling, oropharyngeal exudate, posterior oropharyngeal erythema or uvula swelling.  Cardiovascular:     Rate and Rhythm: Normal rate.  Pulmonary:     Effort: Pulmonary effort is normal.  Neurological:     Mental Status: She is alert.      UC Treatments / Results  Labs (all labs ordered are listed, but only abnormal results are displayed) Labs Reviewed  SARS CORONAVIRUS 2 (TAT 6-24 HRS)  POCT RAPID STREP A, ED / UC    EKG   Radiology No results found.  Procedures Procedures (including critical care time)  Medications Ordered in UC Medications - No data to display  Initial Impression / Assessment and Plan / UC Course  I have reviewed the triage vital signs and the nursing notes.  Pertinent labs & imaging results that were available during my care of the patient were reviewed by me and considered in my medical decision making (see chart for details).     Patient's throat did not appear to be infected at this time.  I did send a prescription for omeprazole 40  mg for 1 month followed by omeprazole  20 mg the following month.  I encouraged her to talk with her primary care provider if this resolves her pain but also if it does not help her symptoms.  She is currently on an antihistamine with no relief of her symptoms.  I also sent Chloraseptic throat spray for some immediate relief for the next few days.  We did discuss prevention of GERD with regimens including decreasing acidic foods, carbonated beverages, alcohol and remaining upright for couple hours after eating.  She verbalized understanding. Final Clinical Impressions(s) / UC Diagnoses   Final diagnoses:  Sore throat     Discharge Instructions      Your throat does not appear to be infected today.  COVID swab was collected.  Urgent care staff will call you with results.  Your results will also be available in [place].  I have sent to your pharmacy omeprazole for silent GERD as I believe this may be the cause of your pain and you got relief with this in the past.  You are to take the 40 mg tablets for 1 month then transition to the 20 mg tablets.  I recommend you follow-up with your primary care provider to discuss further treatment and if you need to be tested for other causes that may be giving you GERD symptoms.  I also sent a throat spray to your pharmacy for some instant relief.    ED Prescriptions     Medication Sig Dispense Auth. Provider   omeprazole (PRILOSEC) 20 MG capsule Take 1 capsule (20 mg total) by mouth daily. 30 capsule Rodena Goldmann A, DO   omeprazole (PRILOSEC) 40 MG capsule Take 1 capsule (40 mg total) by mouth daily. 30 capsule Jaben Benegas A, DO   phenol (CHLORASEPTIC) 1.4 % LIQD Use as directed 1 spray in the mouth or throat as needed for throat irritation / pain. 20 mL Rodena Goldmann A, DO      PDMP not reviewed this encounter.   Elmore Guise, DO 03/08/22 1047

## 2022-03-08 NOTE — Assessment & Plan Note (Addendum)
This is an established problem.  No concern for acute DVT.  No edema on exam today.  Reassurance provided.  Recommended compression stockings and elevation.

## 2022-03-08 NOTE — Assessment & Plan Note (Signed)
Longstanding issue that is very poorly controlled.  No SI or self-harm.  Fortunately she has recently established with psychiatry and has close follow-up with them in 2 weeks. -Continue escitalopram 10 mg daily per psychiatry -Continue lorazepam 0.5 mg as needed for severe anxiety -Emphasized importance of behavioral therapy to complement medications.  She has resources from her psychiatrist and plans to establish -Crisis resources reviewed

## 2022-03-08 NOTE — ED Triage Notes (Signed)
Pt presents to the office for sore throat x 2-3 weeks. Pt reports this has been happening for years and she is concern.

## 2022-03-08 NOTE — Discharge Instructions (Signed)
Your throat does not appear to be infected today.  COVID swab was collected.  Urgent care staff will call you with results.  Your results will also be available in [place].  I have sent to your pharmacy omeprazole for silent GERD as I believe this may be the cause of your pain and you got relief with this in the past.  You are to take the 40 mg tablets for 1 month then transition to the 20 mg tablets.  I recommend you follow-up with your primary care provider to discuss further treatment and if you need to be tested for other causes that may be giving you GERD symptoms.  I also sent a throat spray to your pharmacy for some instant relief.

## 2022-03-09 LAB — SARS CORONAVIRUS 2 (TAT 6-24 HRS): SARS Coronavirus 2: NEGATIVE

## 2022-03-11 DIAGNOSIS — Z419 Encounter for procedure for purposes other than remedying health state, unspecified: Secondary | ICD-10-CM | POA: Diagnosis not present

## 2022-03-13 NOTE — Telephone Encounter (Signed)
Transition Care Management Unsuccessful Follow-up Telephone Call  Date of discharge and from where:   03/04/2022 from [facility]  Attempts:  2nd Attempt  Reason for unsuccessful TCM follow-up call:  Left voice message    

## 2022-03-20 DIAGNOSIS — I10 Essential (primary) hypertension: Secondary | ICD-10-CM | POA: Diagnosis not present

## 2022-03-20 DIAGNOSIS — Z743 Need for continuous supervision: Secondary | ICD-10-CM | POA: Diagnosis not present

## 2022-03-20 DIAGNOSIS — R404 Transient alteration of awareness: Secondary | ICD-10-CM | POA: Diagnosis not present

## 2022-03-20 DIAGNOSIS — R55 Syncope and collapse: Secondary | ICD-10-CM | POA: Diagnosis not present

## 2022-03-20 DIAGNOSIS — R0689 Other abnormalities of breathing: Secondary | ICD-10-CM | POA: Diagnosis not present

## 2022-03-20 NOTE — Telephone Encounter (Signed)
Transition Care Management Unsuccessful Follow-up Telephone Call  Date of discharge and from where:  03/04/2022 from [facility]  Attempts:  3rd Attempt  Reason for unsuccessful TCM follow-up call:  Unable to reach patient   Appt completed on 03/07/2022 with PCP

## 2022-03-21 ENCOUNTER — Telehealth: Payer: Self-pay

## 2022-03-21 NOTE — Telephone Encounter (Signed)
Diana Hopkins had syncope episode yesterday while at the [facility] for her daughters appointment. EMS did come and examine her. Diana Hopkins did not go to the hospital. Diana Hopkins is scheduled for a phone follow up with psychiatrist tomorrow.  Routed to Dr. Alcus Dad, Diana Hopkins's primary care provider. Dr. Rock Nephew, please schedule follow up and send referrals as appropriate. Diana Hopkins is open to a referral to neurology for further assessment of syncope episodes.   See contact information below. Lauree Chandler is the Geophysical data processor for the [employer] at [employer]. Lauree Chandler can help to ensure Diana Hopkins and family are connected with case management/care coordination, as well as counseling.   Bernerd Pho, MSW, Development worker, international aid (she/her)  Refugee Wellness Program Manager  [facility] [place]  [email or web address] .[phone], [place], [place] [address]  [phone]  (o) [phone]

## 2022-03-22 DIAGNOSIS — F4323 Adjustment disorder with mixed anxiety and depressed mood: Secondary | ICD-10-CM | POA: Diagnosis not present

## 2022-03-27 ENCOUNTER — Ambulatory Visit (INDEPENDENT_AMBULATORY_CARE_PROVIDER_SITE_OTHER): Payer: Medicaid Other | Admitting: Family Medicine

## 2022-03-27 ENCOUNTER — Encounter: Payer: Self-pay | Admitting: Family Medicine

## 2022-03-27 ENCOUNTER — Ambulatory Visit (HOSPITAL_COMMUNITY)
Admission: RE | Admit: 2022-03-27 | Disposition: A | Discharge status: Home / Self Care | Payer: Medicaid Other | Source: Ambulatory Visit

## 2022-03-27 VITALS — BP 125/84 | HR 84 | Ht 62.0 in | Wt 174.0 lb

## 2022-03-27 DIAGNOSIS — R4182 Altered mental status, unspecified: Secondary | ICD-10-CM | POA: Insufficient documentation

## 2022-03-27 DIAGNOSIS — R251 Tremor, unspecified: Secondary | ICD-10-CM

## 2022-03-27 DIAGNOSIS — R0789 Other chest pain: Secondary | ICD-10-CM | POA: Insufficient documentation

## 2022-03-27 DIAGNOSIS — F419 Anxiety disorder, unspecified: Secondary | ICD-10-CM | POA: Insufficient documentation

## 2022-03-27 DIAGNOSIS — R55 Syncope and collapse: Secondary | ICD-10-CM | POA: Insufficient documentation

## 2022-03-27 NOTE — Progress Notes (Addendum)
Interpreter Used  SUBJECTIVE:   CHIEF COMPLAINT / HPI:   Collapse: This is a recurrent problem which started 9 months ago. She got better for about 7 months until about 1-2 months ago when her symptoms started again. The problem occurs intermittently, and it has been waxing and waning. There was no loss of consciousness. (She denies LOC for each episode but will feel confused and weak for a few minutes after her symptoms. Whenever she experiences these episodes, she is usually unable to talk or get up at the time, even though she is aware of the situation and of her surroundings.  Each episode lasts for about 1 hour. She gets better after some time of relaxation.). Associated symptoms include an aura - she could tell when it is about to happen. Whenever this episode occurs, she talks jargon to herself, as previously witnessed by her interpreter, who was with her today and confirmed by the patient.  Pertinent negatives include no auditory change, bladder incontinence, bowel incontinence, clumsiness, fever, nausea, palpitations, slurred speech, or vomiting. She denies head trauma at each episode. It is associated with headache, eye, jaw, and teeth pain. She sometimes jerks, her hands tremble with each episode, and her teeth lock up tight for a few seconds. She has tried nothing for the symptoms. There is no history of vertigo or dizziness. She experiences chest tightness and a lot of anxiety, as if she is about to pass out. Symptoms is not position dependent. She had exact similar episode three weeks ago for which she was evaluated in the ED and her most recent was 6 days ago and none since then. She denies any symptoms at the moment.  Depression: She was diagnosed with depression about 9 months ago. She was started on Zoloft with no improvement. She saw her Psychiatrist last week, who started her on Lexapro 10 mg and increased her Remeron from 15 mg to 30 mg QD. She is also on Ativan 0.5 mg prn. She  can in with two bottles of Ativa 0.5 mg Q8 prn and Q12 prn. She came in with two bottles of 50 mg Zoloft and two bottles of Remeron 15mg  and 30 mg, respectively. She confirmed that her Psychiatrist asked her to stop her Zoloft, and she had not been taking it.  She endorses intermittent SI but has known today. Her last SI was more than 1 week ago. She feels stressed with 6 children and no help or support at home. Her husband lives in [place] and travels a lot as he is a Naval architect. Her youngest child is 1, and her oldest is 7. She denies previous hx of suicide attempts. She has f/u with her Psychiatrist in 2 weeks. She could not tell me the name of her Psychiatrist.  PERTINENT  PMH / PSH: PMHx reviewed  OBJECTIVE:   Vitals:   03/27/22 1001  BP: 125/84  Pulse: 84  SpO2: 100%  Weight: 174 lb (78.9 kg)  Height: 5\' 2"  (1.575 m)    Physical Exam Vitals reviewed.  Cardiovascular:     Rate and Rhythm: Normal rate and regular rhythm.     Heart sounds: Normal heart sounds. No murmur heard. Pulmonary:     Effort: Pulmonary effort is normal. No respiratory distress.     Breath sounds: Normal breath sounds. No wheezing.  Abdominal:     General: Abdomen is flat. Bowel sounds are normal. There is no distension.     Tenderness: There is no abdominal tenderness.  Skin:    Coloration: Skin is not pale.  Neurological:     General: No focal deficit present.     Mental Status: She is oriented to person, place, and time.     Cranial Nerves: No cranial nerve deficit.     Sensory: No sensory deficit.     Motor: No weakness.     Gait: Gait normal.     Deep Tendon Reflexes: Reflexes normal.  Psychiatric:        Mood and Affect: Mood is anxious.     Comments: Currently, no SI or HI Rapid but non-pressured speech      ASSESSMENT/PLAN:   Collapse Less likely syncope. Differentials include panic attacks, anxiety, seizures, and vasovagal syndrome. It is less likely cardiac etiology, but it is a  possibility. ?? Polypharmacy. I reviewed her ED visit note, lab, CT head, and EKG from 3 weeks ago, which were all unremarkable. EKG repeated during this visit is unchanged from the one done 3 weeks ago.  EEG discussed - referral to neuro discussed, and she agreed with the plan. I doubt cardiac etiology, but I will order ECHO as well for baseline evaluation. I discussed CBT with psychiatry and adequate medication counseling and adjustment. She will contact their office to be seen soon for medication clarification. ED precautions discussed.   Anxiety  Lots of stress and anxiety are going on, likely contributing to her symptoms. During this visit, sometimes, it was difficult for me to get a word in as she was trying to speak all in her mind at once, despite using an interpreter. She also came in with many medication bottles of the same. She combined her Zoloft 50 mg bottle but did mention that she is not taking it anyway. She combined her 0.5 mg Ativan into one bottle of her most recent dose adjustment of 0.5 mg Q8 prn during this visit (i.e., similar dose with different frequency, most recent frequency of Q8 prn) I took away her Remeron 15 mg and handed it to the RN to discard while she continued her new dose of 30 mg. I advised her to contact her Psychiatrist's office for a sooner appointment for medication review and adjustment. I also helped her schedule a PCP follow-up for reassessment. She agreed with the plan.  ED precautions discussed.    More than 50% of this face to face encounter was spent on counseling, and coordination of care.   Janit Pagan, MD [facility]

## 2022-03-27 NOTE — Assessment & Plan Note (Signed)
>>  ASSESSMENT AND PLAN FOR ANXIETY WRITTEN ON 03/27/2022  2:33 PM BY Kinnie Feil, MD   Lots of stress and anxiety are going on, likely contributing to her symptoms. During this visit, sometimes, it was difficult for me to get a word in as she was trying to speak all in her mind at once, despite using an interpreter. She also came in with many medication bottles of the same. She combined her Zoloft 50 mg bottle but did mention that she is not taking it anyway. She combined her 0.5 mg Ativan into one bottle of her most recent dose adjustment of 0.5 mg Q8 prn during this visit (i.e., similar dose with different frequency, most recent frequency of Q8 prn) I took away her Remeron 15 mg and handed it to the RN to discard while she continued her new dose of 30 mg. I advised her to contact her Psychiatrist's office for a sooner appointment for medication review and adjustment. I also helped her schedule a PCP follow-up for reassessment. She agreed with the plan.  ED precautions discussed.

## 2022-03-27 NOTE — Progress Notes (Deleted)
[id number] 

## 2022-03-27 NOTE — Assessment & Plan Note (Signed)
  Lots of stress and anxiety are going on, likely contributing to her symptoms. During this visit, sometimes, it was difficult for me to get a word in as she was trying to speak all in her mind at once, despite using an interpreter. She also came in with many medication bottles of the same. She combined her Zoloft 50 mg bottle but did mention that she is not taking it anyway. She combined her 0.5 mg Ativan into one bottle of her most recent dose adjustment of 0.5 mg Q8 prn during this visit (i.e., similar dose with different frequency, most recent frequency of Q8 prn) I took away her Remeron 15 mg and handed it to the RN to discard while she continued her new dose of 30 mg. I advised her to contact her Psychiatrist's office for a sooner appointment for medication review and adjustment. I also helped her schedule a PCP follow-up for reassessment. She agreed with the plan.  ED precautions discussed.

## 2022-03-27 NOTE — Assessment & Plan Note (Addendum)
Less likely syncope. Differentials include panic attacks, anxiety, seizures, and vasovagal syndrome. It is less likely cardiac etiology, but it is a possibility. ?? Polypharmacy. I reviewed her ED visit note, lab, CT head, and EKG from 3 weeks ago, which were all unremarkable. EKG repeated during this visit is unchanged from the one done 3 weeks ago.  EEG discussed - referral to neuro discussed, and she agreed with the plan. I doubt cardiac etiology, but I will order ECHO as well for baseline evaluation. I discussed CBT with psychiatry and adequate medication counseling and adjustment. She will contact their office to be seen soon for medication clarification. ED precautions discussed.

## 2022-03-27 NOTE — Patient Instructions (Addendum)
Tremor A tremor is trembling or shaking that you cannot control. Most tremors affect the hands or arms. Tremors can also affect the head, vocal cords, face, and other parts of the body. There are many types of tremors. Common types include: Essential tremor. These usually occur in people older than 40. This type of tremor may run in families and can happen in otherwise healthy people. Resting tremor. These occur when the muscles are at rest, such as when your hands are resting in your lap. People with Parkinson's disease often have resting tremors. Postural tremor. These occur when you try to hold a pose, such as keeping your hands outstretched. Kinetic tremor. These occur during purposeful movement, such as trying to touch a finger to your nose. Task-specific tremor. These may occur when you do certain tasks such as writing, speaking, or standing. Psychogenic tremor. These are greatly reduced or go away when you are distracted. These tremors happen due to underlying stress or psychiatric disease. They can happen in people of all ages. Some types of tremors have no known cause. Tremors can also be a symptom of nervous system problems (neurological disorders) that may occur with aging. Some tremors go away with treatment, while others do not. Follow these instructions at home: Lifestyle     If you drink alcohol: Limit how much you have to: 0-1 drink a day for women who are not pregnant. 0-2 drinks a day for men. Know how much alcohol is in a drink. In the [place] one drink equals one 12 oz bottle of beer (355 mL), one 5 oz glass of wine (148 mL), or one 1 oz glass of hard liquor (44 mL). Do not use any products that contain nicotine or tobacco. These products include cigarettes, chewing tobacco, and vaping devices, such as e-cigarettes. If you need help quitting, ask your health care provider. Avoid extreme heat and extreme cold. Limit your caffeine intake, as told by your health care  provider. Try to get 8 hours of sleep each night. Find ways to manage your stress, such as meditation or yoga. General instructions Take over-the-counter and prescription medicines only as told by your health care provider. Keep all follow-up visits. This is important. Contact a health care provider if: You develop a tremor after starting a new medicine. You have a tremor along with other symptoms such as: Numbness. Tingling. Pain. Weakness. Your tremor gets worse. Your tremor interferes with your day-to-day life. Summary A tremor is trembling or shaking that you cannot control. Most tremors affect the hands or arms. Some types of tremors have no known cause. Others may be a symptom of nervous system problems (neurological disorders). Make sure you discuss any tremors you have with your health care provider. This information is not intended to replace advice given to you by your health care provider. Make sure you discuss any questions you have with your health care provider. Document Revised: 03/17/2021 Document Reviewed: 03/17/2021 Elsevier Patient Education  2023 Elsevier Inc.  

## 2022-04-02 ENCOUNTER — Telehealth: Payer: Self-pay

## 2022-04-02 NOTE — Telephone Encounter (Signed)
Patient No Showed for 10/17 ECHO appt. Salvatore Marvel, CMA

## 2022-04-02 NOTE — Telephone Encounter (Signed)
-----   Message from Kinnie Feil, MD sent at 03/27/2022  2:41 PM EDT ----- Please, help patient schedule ECHO. Thanks.

## 2022-04-04 ENCOUNTER — Other Ambulatory Visit: Payer: Self-pay | Admitting: Sports Medicine

## 2022-04-05 ENCOUNTER — Telehealth: Payer: Self-pay

## 2022-04-05 DIAGNOSIS — F4323 Adjustment disorder with mixed anxiety and depressed mood: Secondary | ICD-10-CM | POA: Diagnosis not present

## 2022-04-05 DIAGNOSIS — Z09 Encounter for follow-up examination after completed treatment for conditions other than malignant neoplasm: Secondary | ICD-10-CM

## 2022-04-05 NOTE — Telephone Encounter (Signed)
SWCM helped mother connect to [facility]:    Pt reports that she has had recent social connections, pt's husband and pt attended a wedding. Pt reports this has made her feel better. Pt reports when home alone she experiences depressive symptoms- Husband works out of state. Pt reports she is sleeping somewhat better, however is still having headaches.  Dr. [place] recommends pt see a neurologist for f/u of headaches and syncope. Pt reports that appetite is also improved. Pt reports panic attacks and syncope about 2-3 times one week ago and thoughts of hopelessness, but no panic attacks or syncope this past week. Psychiatrist explained to pt that medication is starting to work. Pt reports that it is helpful for her to be around people of her culture and religion for socialization. Mother to been seen for f/u in 2 weeks on Nov 9th at 10:40am.   SWCM emailed Bernerd Pho with [employer] to ask for coordination on interpretation for appt and transportation assistance as needed for pt's appt.    Shaune Pollack, BSW, QP Social Work Case Programmer, multimedia and [employer] [facility] Office: [phone] Direct Number: [phone]

## 2022-04-09 ENCOUNTER — Telehealth: Payer: Self-pay | Admitting: *Deleted

## 2022-04-09 NOTE — Telephone Encounter (Signed)
PA pending with [facility].  Tracking: [id number]

## 2022-04-11 DIAGNOSIS — Z419 Encounter for procedure for purposes other than remedying health state, unspecified: Secondary | ICD-10-CM | POA: Diagnosis not present

## 2022-04-11 NOTE — Telephone Encounter (Signed)
EKG report needed and faxed to [facility] for review

## 2022-04-13 ENCOUNTER — Ambulatory Visit: Payer: Self-pay | Admitting: Family Medicine

## 2022-04-19 DIAGNOSIS — F4323 Adjustment disorder with mixed anxiety and depressed mood: Secondary | ICD-10-CM | POA: Diagnosis not present

## 2022-04-25 ENCOUNTER — Other Ambulatory Visit: Payer: Self-pay | Admitting: Family Medicine

## 2022-04-27 ENCOUNTER — Ambulatory Visit (INDEPENDENT_AMBULATORY_CARE_PROVIDER_SITE_OTHER): Payer: Medicaid Other | Admitting: Family Medicine

## 2022-04-27 VITALS — BP 110/68 | HR 85 | Wt 170.4 lb

## 2022-04-27 DIAGNOSIS — F419 Anxiety disorder, unspecified: Secondary | ICD-10-CM

## 2022-04-27 DIAGNOSIS — F32A Depression, unspecified: Secondary | ICD-10-CM | POA: Diagnosis not present

## 2022-04-27 DIAGNOSIS — G44209 Tension-type headache, unspecified, not intractable: Secondary | ICD-10-CM

## 2022-04-27 DIAGNOSIS — Z23 Encounter for immunization: Secondary | ICD-10-CM

## 2022-04-27 MED ORDER — IBUPROFEN 600 MG PO TABS: 600.0000 mg | ORAL_TABLET | Freq: Three times a day (TID) | ORAL | 0 refills | Status: DC | PRN

## 2022-04-27 NOTE — Progress Notes (Unsigned)
    SUBJECTIVE:   CHIEF COMPLAINT / HPI:   Anxiety/depression -Saw [facility]- they don't have interpreter capabilities, needs different referral for therapist -  Headaches For 2-3 months, located in temples Sharp pain Takes Tylenol, no improvement Referred to neurology previously  PERTINENT  PMH / PSH: ***  OBJECTIVE:   BP 110/68   Pulse 85   Wt 170 lb 6.4 oz (77.3 kg)   SpO2 98%   BMI 31.17 kg/m   ***  ASSESSMENT/PLAN:   No problem-specific Assessment & Plan notes found for this encounter.     Maury Dus, MD [facility]

## 2022-04-27 NOTE — Patient Instructions (Addendum)
It was great to see you!  I will make sure you have referrals to neurology (brain doctor for your headaches) and psychiatry (mental health doctors for stress, anxiety, depression). Loletta Specter will help coordinate as well.  You can try Ibuprofen alternating with the Tylenol for your headaches. Just don't take these every single day because that could make your headaches worse.  Trying to reduce stress, drink plenty of water, and get enough sleep will also help with your headaches.  We will coordinate a follow up appointment in our [facility] so we can take more time with you.  Take care, Dr Anner Crete

## 2022-04-29 ENCOUNTER — Encounter (HOSPITAL_COMMUNITY): Payer: Self-pay

## 2022-04-29 ENCOUNTER — Other Ambulatory Visit: Payer: Self-pay

## 2022-04-29 ENCOUNTER — Emergency Department (HOSPITAL_COMMUNITY): Payer: Medicaid Other

## 2022-04-29 ENCOUNTER — Emergency Department (HOSPITAL_COMMUNITY)
Admission: EM | Admit: 2022-04-29 | Discharge: 2022-04-29 | Disposition: A | Discharge status: Home / Self Care | Payer: Medicaid Other | Attending: Emergency Medicine | Admitting: Emergency Medicine

## 2022-04-29 DIAGNOSIS — Z743 Need for continuous supervision: Secondary | ICD-10-CM | POA: Diagnosis not present

## 2022-04-29 DIAGNOSIS — R55 Syncope and collapse: Secondary | ICD-10-CM | POA: Diagnosis not present

## 2022-04-29 DIAGNOSIS — N9489 Other specified conditions associated with female genital organs and menstrual cycle: Secondary | ICD-10-CM | POA: Insufficient documentation

## 2022-04-29 DIAGNOSIS — R519 Headache, unspecified: Secondary | ICD-10-CM | POA: Diagnosis not present

## 2022-04-29 DIAGNOSIS — I959 Hypotension, unspecified: Secondary | ICD-10-CM | POA: Diagnosis not present

## 2022-04-29 DIAGNOSIS — R6889 Other general symptoms and signs: Secondary | ICD-10-CM | POA: Diagnosis not present

## 2022-04-29 LAB — CBC
HCT: 40.8 % (ref 36.0–46.0)
Hemoglobin: 13.5 g/dL (ref 12.0–15.0)
MCH: 28.5 pg (ref 26.0–34.0)
MCHC: 33.1 g/dL (ref 30.0–36.0)
MCV: 86.1 fL (ref 80.0–100.0)
Platelets: 216 10*3/uL (ref 150–400)
RBC: 4.74 MIL/uL (ref 3.87–5.11)
RDW: 13.3 % (ref 11.5–15.5)
WBC: 4.2 10*3/uL (ref 4.0–10.5)
nRBC: 0 % (ref 0.0–0.2)

## 2022-04-29 LAB — I-STAT BETA HCG BLOOD, ED (MC, WL, AP ONLY): I-stat hCG, quantitative: <5 m[IU]/mL (ref <5)

## 2022-04-29 LAB — BASIC METABOLIC PANEL
Anion gap: 8 (ref 5–15)
BUN: 9 mg/dL (ref 6–20)
CO2: 23 mmol/L (ref 22–32)
Calcium: 9.4 mg/dL (ref 8.9–10.3)
Chloride: 108 mmol/L (ref 98–111)
Creatinine, Ser: 0.54 mg/dL (ref 0.44–1.00)
GFR, Estimated: >60 mL/min (ref >60)
Glucose, Bld: 110 mg/dL — ABNORMAL HIGH (ref 70–99)
Potassium: 3.7 mmol/L (ref 3.5–5.1)
Sodium: 139 mmol/L (ref 135–145)

## 2022-04-29 MED ORDER — DIPHENHYDRAMINE HCL 25 MG PO CAPS: 25.0000 mg | ORAL_CAPSULE | Freq: Once | ORAL | Status: DC

## 2022-04-29 MED ORDER — METOCLOPRAMIDE HCL 10 MG PO TABS: 5.0000 mg | ORAL_TABLET | Freq: Once | ORAL | Status: DC

## 2022-04-29 MED ORDER — ACETAMINOPHEN 500 MG PO TABS: 1000.0000 mg | ORAL_TABLET | Freq: Once | ORAL | Status: DC

## 2022-04-29 NOTE — ED Provider Notes (Signed)
[facility] EMERGENCY DEPARTMENT Provider Note   CSN: [id number] Arrival date & time: 04/29/22  1233     History  Chief Complaint  Patient presents with   Loss of Consciousness    Cindi Ghazarian is a 26 y.o. female.  Patient as above with significant medical history as below, including anxiety, recurrent headache who presents to the ED with complaint of fall, head injury, headache. Pt reports this is a recurrent problem. She had an argument with her spouse this morning, she felt very upset. She went to [employer] to run an errand, while walking up to the store she felt very upset about the argument with her spouse, began to feel light headed, was breathing rapidly, felt hot/sweaty, was having palpitations, thought she was "going to die." She then fell to the ground, she is unsure if she had LOC but she thinks so. 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 No vision or hearing changes at this time. On my evaluation her primary complaint is headache and her severe anxiety/worry. Spouse is not with her in treatment area.   Seen by her pcp 11/17 with similar complaint. Similar ED visit 9/24   Past Medical History:  Diagnosis Date   Cholestasis during pregnancy in third trimester    Cough 08/17/2021   Forearm pain 09/19/2021   Medical history non-contributory     Past Surgical History:  Procedure Laterality Date   arm surgery     FRACTURE SURGERY     HAND SURGERY       The history is provided by the patient. The history is limited by a language barrier. A language interpreter was used.  Loss of Consciousness Associated symptoms: headaches   Associated symptoms: no chest pain, no fever, no nausea, no palpitations and no shortness of breath        Home Medications Prior to Admission medications   Medication Sig Start Date End Date  Taking? Authorizing Provider  acetaminophen (TYLENOL) 500 MG tablet Take 1 tablet (500 mg total) by mouth every 6 (six) hours as needed. Patient not taking: Reported on 03/27/2022 02/21/22   Bess Kinds, MD  cetirizine (ZYRTEC) 10 MG tablet Take 1 tablet (10 mg total) by mouth daily. 01/12/22   Cora Collum, DO  Elastic Bandages & Supports (MEDICAL COMPRESSION STOCKINGS) MISC 1 each by Does not apply route daily. 03/07/22   Maury Dus, MD  ibuprofen (ADVIL) 600 MG tablet Take 1 tablet (600 mg total) by mouth every 8 (eight) hours as needed for headache. 04/27/22   Maury Dus, MD  levonorgestrel (LILETTA, 52 MG,) 20.1 MCG/DAY IUD 1 each by Intrauterine route once.    [provider]  LORazepam (ATIVAN) 0.5 MG tablet Take 1 tablet (0.5 mg total) by mouth every 8 (eight) hours as needed for anxiety. 03/04/22   Terrilee Files, MD  melatonin 3 MG TABS tablet Take 1 tablet (3 mg total) by mouth at bedtime. Patient not taking: Reported on 03/27/2022 11/27/21   Maury Dus, MD  omeprazole (PRILOSEC) 20 MG capsule Take 1 capsule (20 mg total) by mouth daily. 03/08/22   Claudie Leach, DO  omeprazole (PRILOSEC) 40 MG capsule Take 1 capsule (40 mg total) by mouth daily. 03/08/22   Gillermo Murdoch A, DO  sertraline (ZOLOFT) 100 MG tablet Take 1 tablet (100 mg total) by mouth daily.  02/20/22 06/20/22  Bess Kinds, MD      Allergies    Patient has no known allergies.    Review of Systems   Review of Systems  Constitutional:  Negative for activity change and fever.  HENT:  Negative for facial swelling and trouble swallowing.   Eyes:  Negative for discharge and redness.  Respiratory:  Negative for cough and shortness of breath.   Cardiovascular:  Positive for syncope. Negative for chest pain and palpitations.  Gastrointestinal:  Negative for abdominal pain and nausea.  Genitourinary:  Negative for dysuria and flank pain.  Musculoskeletal:  Negative for back pain and  gait problem.  Skin:  Negative for pallor and rash.  Neurological:  Positive for syncope and headaches.  Psychiatric/Behavioral:  Negative for suicidal ideas. The patient is nervous/anxious.     Physical Exam Updated Vital Signs BP (!) 106/57   Pulse 74   Temp 98.2 F (36.8 C) (Oral)   Resp 18   SpO2 100%  Physical Exam Vitals and nursing note reviewed.  Constitutional:      General: She is not in acute distress.    Appearance: Normal appearance.  HENT:     Head: Normocephalic and atraumatic. No raccoon eyes, Battle's sign, right periorbital erythema or left periorbital erythema.     Jaw: There is normal jaw occlusion. No trismus.     Right Ear: External ear normal.     Left Ear: External ear normal.     Nose: Nose normal.     Mouth/Throat:     Mouth: Mucous membranes are moist.  Eyes:     General: No scleral icterus.       Right eye: No discharge.        Left eye: No discharge.     Extraocular Movements: Extraocular movements intact.     Pupils: Pupils are equal, round, and reactive to light.  Cardiovascular:     Rate and Rhythm: Normal rate and regular rhythm.     Pulses: Normal pulses.     Heart sounds: Normal heart sounds.     No S3 or S4 sounds.  Pulmonary:     Effort: Pulmonary effort is normal. No tachypnea, accessory muscle usage or respiratory distress.     Breath sounds: Normal breath sounds. No decreased breath sounds.  Abdominal:     General: Abdomen is flat.     Palpations: Abdomen is soft.     Tenderness: There is no abdominal tenderness. There is no guarding or rebound.  Musculoskeletal:        General: Normal range of motion.     Cervical back: Full passive range of motion without pain and normal range of motion.     Right lower leg: No edema.     Left lower leg: No edema.  Skin:    General: Skin is warm and dry.     Capillary Refill: Capillary refill takes less than 2 seconds.       Neurological:     Mental Status: She is alert and oriented to  person, place, and time.     GCS: GCS eye subscore is 4. GCS verbal subscore is 5. GCS motor subscore is 6.     Cranial Nerves: Cranial nerves 2-12 are intact. No dysarthria or facial asymmetry.     Sensory: Sensation is intact.     Motor: Motor function is intact. No tremor or pronator drift.     Coordination: Coordination is intact. Coordination normal.     Comments:  Strength 5/5 symmetric BL LE UE   Psychiatric:        Mood and Affect: Mood normal.        Behavior: Behavior normal.     ED Results / Procedures / Treatments   Labs (all labs ordered are listed, but only abnormal results are displayed) Labs Reviewed  BASIC METABOLIC PANEL - Abnormal; Notable for the following components:      Result Value   Glucose, Bld 110 (*)    All other components within normal limits  CBC  I-STAT BETA HCG BLOOD, ED ([facility], [facility], [facility] ONLY)    EKG EKG Interpretation  Date/Time:  Sunday April 29 2022 13:55:49 EST Ventricular Rate:  76 PR Interval:  137 QRS Duration: 78 QT Interval:  369 QTC Calculation: 415 R Axis:   64 Text Interpretation: Sinus rhythm Low voltage, precordial leads similar to prior no stemi Confirmed by Tanda RockersGray, Terryl Niziolek ([id number]) on 04/29/2022 2:10:03 PM  Radiology CT Head Wo Contrast  Result Date: 04/29/2022 CLINICAL DATA:  Syncope/presyncope, cerebrovascular cause suspected Headache, increasing frequency or severity EXAM: CT HEAD WITHOUT CONTRAST TECHNIQUE: Contiguous axial images were obtained from the base of the skull through the vertex without intravenous contrast. RADIATION DOSE REDUCTION: This exam was performed according to the departmental dose-optimization program which includes automated exposure control, adjustment of the mA and/or kV according to patient size and/or use of iterative reconstruction technique. COMPARISON:  March 02, 2021, March 04, 2022 FINDINGS: Brain: No evidence of acute infarction, hemorrhage, hydrocephalus, extra-axial collection or mass  lesion/mass effect. Vascular: No hyperdense vessel or unexpected calcification. Skull: Normal. Negative for fracture or focal lesion. Sinuses/Orbits: No acute finding. Other: None. IMPRESSION: No acute intracranial abnormality. Electronically Signed   By: Meda KlinefelterStephanie  Peacock M.D.   On: 04/29/2022 14:24    Procedures Procedures    Medications Ordered in ED Medications  acetaminophen (TYLENOL) tablet 1,000 mg (has no administration in time range)  metoCLOPramide (REGLAN) tablet 5 mg (has no administration in time range)  diphenhydrAMINE (BENADRYL) capsule 25 mg (has no administration in time range)    ED Course/ Medical Decision Making/ A&P                           Medical Decision Making Risk Prescription drug management.   This patient presents to the ED with chief complaint(s) of headache, anxious, fall with pertinent past medical history of as above which further complicates the presenting complaint. The complaint involves an extensive differential diagnosis and also carries with it a high risk of complications and morbidity.    The differential diagnosis includes but not limited to Differential diagnosis includes but is not exclusive to subarachnoid hemorrhage, meningitis, encephalitis, previous head trauma, cavernous venous thrombosis, muscle tension headache, glaucoma, temporal arteritis, migraine or migraine equivalent, etc.  Differential diagnoses for head trauma includes subdural hematoma, epidural hematoma, acute concussion, traumatic subarachnoid hemorrhage, cerebral contusions, etc.  . Serious etiologies were considered.   The initial plan is to imaging/labs ordered in triage   Additional history obtained: Additional history obtained from  na Records reviewed Primary Care Documents and prior ed visits, home meds  Independent labs interpretation:  The following labs were independently interpreted: cbc/bmp/preg reviewed, stable  Independent visualization of imaging: -  I independently visualized the following imaging with scope of interpretation limited to determining acute life threatening conditions related [facility] emergency [facility], which revealed no acute process  Cardiac monitoring was reviewed and interpreted by myself which  shows na  Treatment and Reassessment: Reglan/benadryl Tylenol Oral fluids >> went to reassess the pt and she had eloped.  Consultation: - Consulted or discussed management/test interpretation w/ external professional: nsr. EKG w/o stemi  Consideration for admission or further workup: Admission was considered   Patient presents with headache. Based on the patient's history and physical there is very low clinical suspicion for significant intracranial pathology. The headache was not sudden onset, not maximal at onset, there are no neurologic findings on exam, the patient does not have a fever, the patient does not have any jaw claudication, the patient does not endorse a clotting disorder, patient denies any trauma or eye pain and the headache is not associated with dizziness, weakness on one side of the body, diplopia, vertigo, slurred speech, or ataxia. Given the extremely low risk of these diagnoses further testing and evaluation for these possibilities does not appear to be indicated at this time.  Patient presents with syncopal symptoms without worrisome features. Presentation most suggestive of neuro-cardiogenic or orthostatic cause. Very low suspicion for serious arrhythmia, cardiac ischemia or other serious etiology. ECG reviewed, no evidence of a cardiac arrhythmia such as Brugada, WPW, HOCM, IHSS, Long or short QT. Neurologic exam is nonfocal, not consistent with CVA or primary neurologic abnormality.    Imaging and labs are stable, pt with recurrent headache, also ongoing issues with anxiety and what appears to be a panic attack. [place] syncope score is low risk.   Pt unfortunately eloped prior to  discharge/re-assessment. Did not receive follow up instructions or results.    Social Determinants of health: Non english speaking  Social History   Tobacco Use   Smoking status: Never   Smokeless tobacco: Never  Vaping Use   Vaping Use: Never used  Substance Use Topics   Alcohol use: Never   Drug use: Never            Final Clinical Impression(s) / ED Diagnoses Final diagnoses:  Nonintractable headache, unspecified chronicity pattern, unspecified headache type    Rx / DC Orders ED Discharge Orders     None         Sloan Leiter, DO 04/29/22 1539

## 2022-04-29 NOTE — Assessment & Plan Note (Signed)
>>  ASSESSMENT AND PLAN FOR SEVERE FRONTAL HEADACHES WRITTEN ON 01/23/2024  1:06 PM BY Nataleigh Griffin, MD   >>ASSESSMENT AND PLAN FOR TENSION HEADACHE WRITTEN ON 04/29/2022  7:21 AM BY MALVINA ELLEN, MD  Patient with chronic tension-type headache. Reassuringly she had normal head CT fairly recently. She has sporadic episodes of collapse with maintained awareness (thought to be anxiety related), but otherwise no red flags on history or exam. -Her poorly controlled stress as well as inadequate sleep are certainly contributing to headaches -Discussed headache hygiene -Continue prn Tylenol  and Ibuprofen  (counseled on proper use and medication-overuse headaches) -Previously referred to neuro in light of her episodes of collapse- will place new referral ([facility] one [facility]?)

## 2022-04-29 NOTE — ED Provider Triage Note (Signed)
Emergency Medicine Provider Triage Evaluation Note  Diana Hopkins , a 26 y.o. female  was evaluated in triage.  Pt complains of episode of syncope with significant headache.  Patient reports she got in a heated verbal argument with her spouse, then went to go get her prescriptions.  When she was in the parking lot she began "feeling funny" and then fainted.  Denies grogginess upon awakening, states she knew exactly what happened and felt oriented.  States this has been happening more frequently, has happened twice this month, and at least 5 times last month.  States usually occurs when she feels very stressed, and usually accompanied with headache as well.  Headache worsened by loud sounds, bright lights, and stress.  Headache located on both sides of her forehead and in the front.  States usually she takes medicine and starts to feel better for at least 5 to 6 hours before it returns.  States she has had a headache every day for almost 2 months.  Denies chest pain, shortness of breath, N/V/D.  Requesting medication for headache.  Review of Systems  Positive:  Negative: See above  Physical Exam  BP 105/64 (BP Location: Left Arm)   Pulse 78   Temp 98.2 F (36.8 C) (Oral)   Resp 16   SpO2 100%  Gen:   Awake, no distress, appears uncomfortable and anxious  Resp:  Normal effort, equal chest rise MSK:   Moves extremities without difficulty  Other:  Chest non-TTP.  Abdomen soft, nontender.  Gaze aligned appropriately.  Coordination and strength appear grossly intact.  PERRLA.  Medical Decision Making  Medically screening exam initiated at 12:55 PM.  Appropriate orders placed.  Lakia Gritton was informed that the remainder of the evaluation will be completed by another provider, this initial triage assessment does not replace that evaluation, and the importance of remaining in the ED until their evaluation is complete.  Tylenol provided.     Cecil Cobbs, PA-C 04/29/22 1311

## 2022-04-29 NOTE — ED Notes (Signed)
RN notified by Diplomatic Services operational officer that the pt got dressed an exited. EDP notified

## 2022-04-29 NOTE — Assessment & Plan Note (Signed)
Patient with longstanding, very poorly controlled anxiety (including panic attacks) and depressed mood. Denies SI (reports children are protective factors). Unclear what she's currently taking (?celexa, remeron, and prn ativan). Her current mental health provider unable to continue her care due to lack of interpreter services. -New referral to psychiatry at [facility] -Advised to bring all medications to every appointment -Will schedule in [facility] clinic to allow more time with patient

## 2022-04-29 NOTE — ED Notes (Signed)
Pt states that she was in [facility] parking lot and thinks that she "passed out" because she fell to the ground.She does endorse hitting her head. She says that for a couple months she has had "episodes" where she gets really upset and starts to feel bad like this. The medication that she is taking, is not working for her. Pt states that the pain in her head travels to her ear and her legs. Pt also complains of chest pain during these episodes and had chest pain today when she passed out.

## 2022-04-29 NOTE — Assessment & Plan Note (Signed)
>>  ASSESSMENT AND PLAN FOR TENSION HEADACHE WRITTEN ON 04/29/2022  7:21 AM BY MALVINA ELLEN, MD  Patient with chronic tension-type headache. Reassuringly she had normal head CT fairly recently. She has sporadic episodes of collapse with maintained awareness (thought to be anxiety related), but otherwise no red flags on history or exam. -Her poorly controlled stress as well as inadequate sleep are certainly contributing to headaches -Discussed headache hygiene -Continue prn Tylenol  and Ibuprofen  (counseled on proper use and medication-overuse headaches) -Previously referred to neuro in light of her episodes of collapse- will place new referral (old one sent [facility] only?)

## 2022-04-29 NOTE — Assessment & Plan Note (Signed)
Patient with chronic tension-type headache. Reassuringly she had normal head CT fairly recently. She has sporadic episodes of collapse with maintained awareness (thought to be anxiety related), but otherwise no red flags on history or exam. -Her poorly controlled stress as well as inadequate sleep are certainly contributing to headaches -Discussed headache hygiene -Continue prn Tylenol and Ibuprofen (counseled on proper use and medication-overuse headaches) -Previously referred to neuro in light of her episodes of collapse- will place new referral (old one sent [facility] only?)

## 2022-04-29 NOTE — ED Triage Notes (Signed)
Pt BIB [facility] from [facility] where she had a syncopal episode in the parking lot. Per the husband this has happened 6 times and is related to anxiety and panic attacks. Pt c/o a headache.

## 2022-04-29 NOTE — ED Notes (Signed)
Pt did not answer to go back to room.

## 2022-04-30 ENCOUNTER — Other Ambulatory Visit: Payer: Self-pay | Admitting: Family Medicine

## 2022-04-30 DIAGNOSIS — R55 Syncope and collapse: Secondary | ICD-10-CM

## 2022-05-11 ENCOUNTER — Encounter: Payer: Self-pay | Admitting: Neurology

## 2022-05-11 DIAGNOSIS — Z419 Encounter for procedure for purposes other than remedying health state, unspecified: Secondary | ICD-10-CM | POA: Diagnosis not present

## 2022-05-12 ENCOUNTER — Ambulatory Visit (HOSPITAL_COMMUNITY)
Admission: EM | Admit: 2022-05-12 | Discharge: 2022-05-12 | Disposition: A | Discharge status: Home / Self Care | Payer: Medicaid Other

## 2022-05-12 ENCOUNTER — Emergency Department (HOSPITAL_BASED_OUTPATIENT_CLINIC_OR_DEPARTMENT_OTHER)
Admission: EM | Admit: 2022-05-12 | Discharge: 2022-05-12 | Disposition: A | Discharge status: Home / Self Care | Payer: Medicaid Other | Attending: Emergency Medicine | Admitting: Emergency Medicine

## 2022-05-12 ENCOUNTER — Other Ambulatory Visit: Payer: Self-pay

## 2022-05-12 ENCOUNTER — Encounter (HOSPITAL_BASED_OUTPATIENT_CLINIC_OR_DEPARTMENT_OTHER): Payer: Self-pay | Admitting: *Deleted

## 2022-05-12 DIAGNOSIS — R6884 Jaw pain: Secondary | ICD-10-CM | POA: Diagnosis not present

## 2022-05-12 DIAGNOSIS — R519 Headache, unspecified: Secondary | ICD-10-CM | POA: Insufficient documentation

## 2022-05-12 MED ORDER — ACETAMINOPHEN 500 MG PO TABS
1000.0000 mg | ORAL_TABLET | Freq: Once | ORAL | Status: AC
  Administered 2022-05-12: 1000 mg via ORAL
  Filled 2022-05-12: qty 2

## 2022-05-12 MED ORDER — DIPHENHYDRAMINE HCL 50 MG/ML IJ SOLN
25.0000 mg | Freq: Once | INTRAMUSCULAR | Status: AC
  Administered 2022-05-12: 25 mg via INTRAMUSCULAR
  Filled 2022-05-12: qty 1

## 2022-05-12 MED ORDER — OXYCODONE-ACETAMINOPHEN 5-325 MG PO TABS
1.0000 | ORAL_TABLET | ORAL | Status: DC | PRN
  Administered 2022-05-12: 1 via ORAL
  Filled 2022-05-12: qty 1

## 2022-05-12 MED ORDER — PROCHLORPERAZINE EDISYLATE 10 MG/2ML IJ SOLN
10.0000 mg | Freq: Once | INTRAMUSCULAR | Status: AC
  Administered 2022-05-12: 10 mg via INTRAMUSCULAR
  Filled 2022-05-12: qty 2

## 2022-05-12 MED ORDER — IBUPROFEN 800 MG PO TABS
800.0000 mg | ORAL_TABLET | Freq: Once | ORAL | Status: AC
  Administered 2022-05-12: 800 mg via ORAL
  Filled 2022-05-12: qty 1

## 2022-05-12 NOTE — ED Notes (Signed)
Interpreter used for [employer]. Difficult discharge d/t pt requesting pain medication. Pt requested Percocet many times during her visit. Pt very upset EDP was not giving her another Percocet while here or a prescription sent to her pharmacy. Pt extremely resistant on leaving the facility, wanted to take pictures of all the medications I was given here while she was here so she could send to her husband.   Pt ambulatory at discharge, this RN walked pt to Exit area, pt given two hot packs per her request. Pt asked for several hot packs, pt given two upon discharge.   During the EDP exam pt requested to have a different interpreter when EDP explained the treatment plan and would not prescribe pt a narcotic

## 2022-05-12 NOTE — ED Triage Notes (Signed)
Pt arrives to triage, no Pashto interpreter available so we are attempting for a [name] as pt is fluent in both languages.

## 2022-05-12 NOTE — ED Notes (Signed)
Pt is in severe pain, given med and hot pack for comfort

## 2022-05-12 NOTE — Discharge Instructions (Addendum)
Please follow-up with your dentist in the office.

## 2022-05-12 NOTE — ED Notes (Addendum)
Dc instructions discussed using Pashto interpreter services

## 2022-05-12 NOTE — ED Notes (Signed)
Pt appears in severe pain

## 2022-05-12 NOTE — ED Provider Notes (Signed)
[facility] EMERGENCY DEPT Provider Note   CSN: [id number] Arrival date & time: 05/12/22  1755     History  Chief Complaint  Patient presents with   Headache   Dental Pain    Diana Hopkins is a 25 y.o. female.  26 yo F with a chief complaint of left temporal headache.  This been going on for couple days.  Tells me that this occurred after having her wisdom tooth extracted on that side.  She had pain that was severe immediately after her numbing medications for the procedure had worn off.  She has been trying Tylenol and ibuprofen without significant improvement.  No fevers no difficulty swallowing.  She has had headaches in the past with things that they were different than this.   Headache Dental Pain Associated symptoms: headaches        Home Medications Prior to Admission medications   Medication Sig Start Date End Date Taking? Authorizing Provider  acetaminophen (TYLENOL) 500 MG tablet Take 1 tablet (500 mg total) by mouth every 6 (six) hours as needed. Patient not taking: Reported on 03/27/2022 02/21/22   Bess Kinds, MD  cetirizine (ZYRTEC) 10 MG tablet Take 1 tablet (10 mg total) by mouth daily. 01/12/22   Cora Collum, DO  Elastic Bandages & Supports (MEDICAL COMPRESSION STOCKINGS) MISC 1 each by Does not apply route daily. 03/07/22   Maury Dus, MD  ibuprofen (ADVIL) 600 MG tablet Take 1 tablet (600 mg total) by mouth every 8 (eight) hours as needed for headache. 04/27/22   Maury Dus, MD  levonorgestrel (LILETTA, 52 MG,) 20.1 MCG/DAY IUD 1 each by Intrauterine route once.    [provider]  LORazepam (ATIVAN) 0.5 MG tablet Take 1 tablet (0.5 mg total) by mouth every 8 (eight) hours as needed for anxiety. 03/04/22   Terrilee Files, MD  melatonin 3 MG TABS tablet Take 1 tablet (3 mg total) by mouth at bedtime. Patient not taking: Reported on 03/27/2022 11/27/21   Maury Dus, MD  omeprazole (PRILOSEC) 20 MG capsule Take 1  capsule (20 mg total) by mouth daily. 03/08/22   Claudie Leach, DO  omeprazole (PRILOSEC) 40 MG capsule Take 1 capsule (40 mg total) by mouth daily. 03/08/22   Gillermo Murdoch A, DO  sertraline (ZOLOFT) 100 MG tablet Take 1 tablet (100 mg total) by mouth daily. 02/20/22 06/20/22  Bess Kinds, MD      Allergies    Patient has no known allergies.    Review of Systems   Review of Systems  Neurological:  Positive for headaches.    Physical Exam Updated Vital Signs BP 114/82 (BP Location: Right Arm)   Pulse 68   Temp (!) 97.5 F (36.4 C) (Oral)   Resp (!) 26   SpO2 92%  Physical Exam Vitals and nursing note reviewed.  Constitutional:      General: She is not in acute distress.    Appearance: She is well-developed. She is not diaphoretic.     Comments: The patient is rocking back and forth on the bed.  Points to her left temple off and on.  HENT:     Head: Normocephalic and atraumatic.     Mouth/Throat:     Comments: No trismus.  No obvious signs of poor dentition.  No edema or erythema surrounding the left upper posterior molar or left lower posterior molar.  She points to her left temple as area of pain.  Has some mild tenderness with flexion of  the jaw. Eyes:     Pupils: Pupils are equal, round, and reactive to light.  Cardiovascular:     Rate and Rhythm: Normal rate and regular rhythm.     Heart sounds: No murmur heard.    No friction rub. No gallop.  Pulmonary:     Effort: Pulmonary effort is normal.     Breath sounds: No wheezing or rales.  Abdominal:     General: There is no distension.     Palpations: Abdomen is soft.     Tenderness: There is no abdominal tenderness.  Musculoskeletal:        General: No tenderness.     Cervical back: Normal range of motion and neck supple.  Skin:    General: Skin is warm and dry.  Neurological:     Mental Status: She is alert and oriented to person, place, and time.  Psychiatric:        Behavior: Behavior normal.      ED Results / Procedures / Treatments   Labs (all labs ordered are listed, but only abnormal results are displayed) Labs Reviewed - No data to display  EKG None  Radiology No results found.  Procedures Procedures    Medications Ordered in ED Medications  oxyCODONE-acetaminophen (PERCOCET/ROXICET) 5-325 MG per tablet 1 tablet (1 tablet Oral Given 05/12/22 1836)  prochlorperazine (COMPAZINE) injection 10 mg (has no administration in time range)  diphenhydrAMINE (BENADRYL) injection 25 mg (has no administration in time range)  acetaminophen (TYLENOL) tablet 1,000 mg (has no administration in time range)  ibuprofen (ADVIL) tablet 800 mg (has no administration in time range)    ED Course/ Medical Decision Making/ A&P                           Medical Decision Making Risk OTC drugs. Prescription drug management.   26 yo F with a chief complaints of headache.  This been going on for couple days.  Tells me it occurred after having some teeth extracted.  Clinically is not obvious that teeth were removed at all.  She could have a dry socket though there is no signs of erythema no tenderness on exam.  She has no trismus.  My record review the patient has a history of temporal headaches in the past.  She tells me that these are not typical with when she is having currently.  I discussed my plan with the patient to give her a headache cocktail and then have her take Tylenol and ibuprofen and follow-up with her doctor in the office.  After hearing that she immediately demanded to have a different language interpreter.  I discussed this with her again and she again was very upset when I told her I was not can to prescribe her narcotics.  8:55 PM:  I have discussed the diagnosis/risks/treatment options with the patient.  Evaluation and diagnostic testing in the emergency department does not suggest an emergent condition requiring admission or immediate intervention beyond what has been  performed at this time.  They will follow up with PCP. We also discussed returning to the ED immediately if new or worsening sx occur. We discussed the sx which are most concerning (e.g., sudden worsening pain, fever, inability to tolerate by mouth) that necessitate immediate return. Medications administered to the patient during their visit and any new prescriptions provided to the patient are listed below.  Medications given during this visit Medications  oxyCODONE-acetaminophen (PERCOCET/ROXICET) 5-325 MG per tablet  1 tablet (1 tablet Oral Given 05/12/22 1836)  prochlorperazine (COMPAZINE) injection 10 mg (has no administration in time range)  diphenhydrAMINE (BENADRYL) injection 25 mg (has no administration in time range)  acetaminophen (TYLENOL) tablet 1,000 mg (has no administration in time range)  ibuprofen (ADVIL) tablet 800 mg (has no administration in time range)     The patient appears reasonably screen and/or stabilized for discharge and I doubt any other medical condition or other [facility] requiring further screening, evaluation, or treatment in the ED at this time prior to discharge.           Final Clinical Impression(s) / ED Diagnoses Final diagnoses:  Left temporal headache    Rx / DC Orders ED Discharge Orders     None         Melene Plan, DO 05/12/22 2055

## 2022-05-12 NOTE — ED Triage Notes (Signed)
With [name], pt has been having severe pain for the past 2 days.  Pt had 2 teeth pulled by dentist on Thursday and she reports that the pain is severe since this occurred in the area where the teeth were pulled and pain is sharp and radiating into teple and ear.  Pt was not seen by DDS since.  Pt DDS was [facility] Dr. Lajoyce Corners Fadima [phone]

## 2022-05-12 NOTE — ED Notes (Signed)
Late entry  Delay with interpreter, during assessment by EDP Adela Lank the patient requested a different language interpreter. New call made, assessment completed.

## 2022-05-12 NOTE — ED Notes (Signed)
Currently waiting for an interpreter to discuss dc instruction and give medication

## 2022-05-15 NOTE — Telephone Encounter (Signed)
ECHO denied by NIA, please review form in your box to call about an appeal if this is still necessary.  Diana Hopkins,CMA

## 2022-05-16 ENCOUNTER — Encounter: Payer: Self-pay | Admitting: Neurology

## 2022-05-16 ENCOUNTER — Other Ambulatory Visit: Payer: Medicaid Other

## 2022-05-17 NOTE — Telephone Encounter (Signed)
Appeal not indicated at this time.

## 2022-05-22 ENCOUNTER — Ambulatory Visit (INDEPENDENT_AMBULATORY_CARE_PROVIDER_SITE_OTHER): Payer: Medicaid Other | Admitting: Student

## 2022-05-22 ENCOUNTER — Encounter: Payer: Self-pay | Admitting: Student

## 2022-05-22 VITALS — BP 111/70 | HR 78 | Ht 62.0 in | Wt 170.5 lb

## 2022-05-22 DIAGNOSIS — G4440 Drug-induced headache, not elsewhere classified, not intractable: Secondary | ICD-10-CM | POA: Diagnosis not present

## 2022-05-22 DIAGNOSIS — T3995XA Adverse effect of unspecified nonopioid analgesic, antipyretic and antirheumatic, initial encounter: Secondary | ICD-10-CM

## 2022-05-22 MED ORDER — NAPROXEN 500 MG PO TABS: 500.0000 mg | ORAL_TABLET | Freq: Two times a day (BID) | ORAL | 0 refills | Status: AC

## 2022-05-22 NOTE — Assessment & Plan Note (Signed)
>>  ASSESSMENT AND PLAN FOR ANALGESIC REBOUND HEADACHE WRITTEN ON 05/22/2022 12:55 PM BY JAGADISH, MAYURI, MD  Has been taking Tylenol  and ibuprofen  scheduled for the last 2 weeks.  She feels that its mostly on her temples bilaterally.  Denies any weakness/neurologic deficits.  I discussed with patient this is most likely analgesic rebound headaches.  I said I would start her on naproxen  for 2 weeks and plan to taper this afterwards and to ensure not to take Tylenol /ibuprofen  during the day with this medication.  Less likely to be intracranial pathology given recent head CTs that have been normal and no neurologic deficits on examination. -Naproxen  500 mg twice daily for 2 weeks, taper after this -Follow-up in 2 weeks

## 2022-05-22 NOTE — Progress Notes (Signed)
    SUBJECTIVE:   CHIEF COMPLAINT / HPI: Bilateral temporal headache  [name] used for encounter  Seen in ED on 12/2 for left temporal headache occurring after teeth extracted. Has history of tension type/frontal headaches.  Has had 2 CT Head-in the past year with no acute abnormalities.  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  Also got an MRA neck during that time which was normal. Denies any fever. Has been taking Tylenol and ibuprofen alternating every 3 hours.  Denies any vomiting. Mostly on the temples bilaterally. Jaws have been hurting from time to time.  PERTINENT  PMH / PSH: History of recurrent headaches  OBJECTIVE:   BP 111/70   Pulse 78   Ht 5\' 2"  (1.575 m)   Wt 170 lb 8 oz (77.3 kg)   BMI 31.18 kg/m   General: Well appearing, NAD, awake, alert, responsive to questions Head: Normocephalic atraumatic CV: Regular rate and rhythm no murmurs rubs or gallops Respiratory: Clear to ausculation bilaterally, no wheezes rales or crackles, chest rises symmetrically,  no increased work of breathing Extremities: Moves upper and lower extremities freely, no edema in LE Neuro: CN II: PERRL CN III, IV,VI: EOMI CV V: Normal sensation in V1, V2, V3 CVII: Symmetric smile and brow raise CN VIII: Normal hearing CN IX,X: Symmetric palate raise  CN XI: 5/5 shoulder shrug CN XII: Symmetric tongue protrusion  UE and LE strength 5/5  ASSESSMENT/PLAN:   Analgesic rebound headache Has been taking Tylenol and ibuprofen scheduled for the last 2 weeks.  She feels that its mostly on her temples bilaterally.  Denies any weakness/neurologic deficits.  I discussed with patient this is most likely analgesic rebound headaches.  I said I would start her on naproxen for 2 weeks and plan to taper this afterwards and to ensure not to take Tylenol/ibuprofen during the day with this medication.  Less likely to be  intracranial pathology given recent head CTs that have been normal and no neurologic deficits on examination. -Naproxen 500 mg twice daily for 2 weeks, taper after this -Follow-up in 2 weeks   , MD [facility]

## 2022-05-22 NOTE — Patient Instructions (Signed)
It was great to see you! Thank you for allowing me to participate in your care!   Our plans for today:  -I am prescribing naproxen 500 mg twice a day for 2 weeks, you will need to come in after 2 weeks for a tapering of this medication.  Please do not take ibuprofen/Tylenol alongside this. -You will need to schedule an appointment for 2 weeks.  I believe this is most likely a analgesic rebound headache from taking medicine scheduled.  Take care and seek immediate care sooner if you develop any concerns.  Levin Erp, MD

## 2022-05-22 NOTE — Assessment & Plan Note (Signed)
Has been taking Tylenol and ibuprofen scheduled for the last 2 weeks.  She feels that its mostly on her temples bilaterally.  Denies any weakness/neurologic deficits.  I discussed with patient this is most likely analgesic rebound headaches.  I said I would start her on naproxen for 2 weeks and plan to taper this afterwards and to ensure not to take Tylenol/ibuprofen during the day with this medication.  Less likely to be intracranial pathology given recent head CTs that have been normal and no neurologic deficits on examination. -Naproxen 500 mg twice daily for 2 weeks, taper after this -Follow-up in 2 weeks

## 2022-06-02 ENCOUNTER — Other Ambulatory Visit: Payer: Self-pay | Admitting: Student

## 2022-06-02 DIAGNOSIS — G4440 Drug-induced headache, not elsewhere classified, not intractable: Secondary | ICD-10-CM

## 2022-06-05 ENCOUNTER — Other Ambulatory Visit: Payer: Medicaid Other

## 2022-06-06 ENCOUNTER — Ambulatory Visit: Payer: Self-pay | Admitting: Family Medicine

## 2022-06-07 ENCOUNTER — Ambulatory Visit: Payer: Medicaid Other | Admitting: Neurology

## 2022-06-07 DIAGNOSIS — R55 Syncope and collapse: Secondary | ICD-10-CM

## 2022-06-07 NOTE — Progress Notes (Signed)
EEG complete - results pending 

## 2022-06-11 DIAGNOSIS — Z419 Encounter for procedure for purposes other than remedying health state, unspecified: Secondary | ICD-10-CM | POA: Diagnosis not present

## 2022-06-12 NOTE — Procedures (Signed)
ELECTROENCEPHALOGRAM REPORT  Date of Study: 06/07/2022  Patient's Name: Diana Hopkins MRN: [id number] Date of Birth: Feb 20, 1996  Referring Provider: Dr. Madison Hickman  Clinical History: This is a 27 year old woman with loss of consciousness. EEG for classification.  Medications: Sertraline  Technical Summary: A multichannel digital EEG recording measured by the international 10-20 system with electrodes applied with paste and impedances below 5000 ohms performed in our laboratory with EKG monitoring in an awake and asleep patient.  Hyperventilation and photic stimulation were performed.  The digital EEG was referentially recorded, reformatted, and digitally filtered in a variety of bipolar and referential montages for optimal display.    Description: The patient is awake and asleep during the recording.  During maximal wakefulness, there is a symmetric, medium voltage 11-12 Hz posterior dominant rhythm that attenuates with eye opening.  The record is symmetric.  During drowsiness and sleep, there is an increase in theta slowing of the background.  Vertex waves and symmetric sleep spindles were seen. Hyperventilation and photic stimulation did not elicit any abnormalities.  There were no epileptiform discharges or electrographic seizures seen.    EKG lead was unremarkable.  Impression: This awake and asleep EEG is normal.    Clinical Correlation: A normal EEG does not exclude a clinical diagnosis of epilepsy.  If further clinical questions remain, prolonged EEG may be helpful.  Clinical correlation is advised.   Ellouise Newer, M.D.

## 2022-06-19 ENCOUNTER — Ambulatory Visit: Payer: Medicaid Other

## 2022-06-21 ENCOUNTER — Encounter (HOSPITAL_COMMUNITY): Payer: Self-pay | Admitting: *Deleted

## 2022-06-21 ENCOUNTER — Ambulatory Visit (HOSPITAL_COMMUNITY)
Admission: EM | Admit: 2022-06-21 | Discharge: 2022-06-21 | Disposition: A | Discharge status: Home / Self Care | Payer: Medicaid Other | Attending: Family Medicine | Admitting: Family Medicine

## 2022-06-21 DIAGNOSIS — J020 Streptococcal pharyngitis: Secondary | ICD-10-CM | POA: Diagnosis not present

## 2022-06-21 LAB — POCT RAPID STREP A, ED / UC: Streptococcus, Group A Screen (Direct): POSITIVE — AB

## 2022-06-21 MED ORDER — IBUPROFEN 600 MG PO TABS: 600.0000 mg | ORAL_TABLET | Freq: Three times a day (TID) | ORAL | 0 refills | Status: DC | PRN

## 2022-06-21 MED ORDER — AMOXICILLIN 875 MG PO TABS: 875.0000 mg | ORAL_TABLET | Freq: Two times a day (BID) | ORAL | 0 refills | Status: AC

## 2022-06-21 NOTE — ED Triage Notes (Signed)
Mom states through professional translator service that she has had chills, body aches, sore throat X 2 days. She has been taking tylenol.

## 2022-06-21 NOTE — Discharge Instructions (Signed)
Strep test is positive 2 days into taking the antibiotics, throw away the toothbrush and begin using a new one. Patient is not contagious after 24 hours of antibiotics; a full 10 days should be completed to prevent rheumatic fever  Take amoxicillin 875 mg--1 tab twice daily for 10 days  Take ibuprofen 600 mg--1 tab every 8 hours as needed for pain.(It will help for fever also)

## 2022-06-21 NOTE — ED Provider Notes (Signed)
[place]    CSN: [id number] Arrival date & time: 06/21/22  0941      History   Chief Complaint Chief Complaint  Patient presents with   Sore Throat   Fever    HPI Diana Hopkins is a 27 y.o. female.    Sore Throat  Fever  Here for sore throat and fever that is been going on for 2 days.  Her kids are here with similar symptoms.  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 Authorizing Provider  amoxicillin (AMOXIL) 875 MG tablet Take 1 tablet (875 mg total) by mouth 2 (two) times daily for 10 days. 06/21/22 07/01/22 Yes Barrett Henle, MD  cetirizine (ZYRTEC) 10 MG tablet Take 1 tablet (10 mg total) by mouth daily. 01/12/22  Yes Paige, Weldon Picking, DO  Elastic Bandages & Supports (MEDICAL COMPRESSION STOCKINGS) MISC 1 each by Does not apply route daily.  03/07/22  Yes Alcus Dad, MD  ibuprofen (ADVIL) 600 MG tablet Take 1 tablet (600 mg total) by mouth every 8 (eight) hours as needed (pain). 06/21/22  Yes Barrett Henle, MD  levonorgestrel (LILETTA, 52 MG,) 20.1 MCG/DAY IUD 1 each by Intrauterine route once.   Yes [provider]  LORazepam (ATIVAN) 0.5 MG tablet Take 1 tablet (0.5 mg total) by mouth every 8 (eight) hours as needed for anxiety. 03/04/22  Yes Hayden Rasmussen, MD  omeprazole (PRILOSEC) 20 MG capsule Take 1 capsule (20 mg total) by mouth daily. 03/08/22  Yes Rodena Goldmann A, DO  omeprazole (PRILOSEC) 40 MG capsule Take 1 capsule (40 mg total) by mouth daily. 03/08/22  Yes Rodena Goldmann A, DO  sertraline (ZOLOFT) 100 MG tablet Take 1 tablet (100 mg total) by mouth daily. 02/20/22 06/20/22  Holley Bouche, MD    Family History Family History  Problem Relation Age of Onset   Hepatitis B Father     Social History Social History   Tobacco Use   Smoking status: Never   Smokeless  tobacco: Never  Vaping Use   Vaping Use: Never used  Substance Use Topics   Alcohol use: Never   Drug use: Never     Allergies   Patient has no known allergies.   Review of Systems Review of Systems  Constitutional:  Positive for fever.     Physical Exam Triage Vital Signs ED Triage Vitals  Enc Vitals Group     BP 06/21/22 1103 107/67     Pulse Rate 06/21/22 1103 98     Resp 06/21/22 1103 18     Temp 06/21/22 1103 98.1 F (36.7 C)     Temp Source 06/21/22 1103 Oral     SpO2 06/21/22 1103 97 %     Weight --      Height --      Head Circumference --      Peak Flow --      Pain Score 06/21/22 1102 0     Pain Loc --      Pain Edu? --      Excl. in [place]? --    No data found.  Updated Vital Signs BP 107/67 (BP Location: Right Arm)   Pulse 98   Temp 98.1 F (36.7 C) (Oral)   Resp 18   SpO2 97%   Visual Acuity Right Eye Distance:   Left Eye Distance:   Bilateral Distance:    Right Eye Near:   Left  Eye Near:    Bilateral Near:     Physical Exam Vitals reviewed.  Constitutional:      General: She is not in acute distress.    Appearance: She is not toxic-appearing.  HENT:     Nose: Nose normal.     Mouth/Throat:     Mouth: Mucous membranes are moist.     Comments: There is erythema and hypertrophy of both tonsils and there is white exudate on the tonsils.  No asymmetry Eyes:     Extraocular Movements: Extraocular movements intact.     Conjunctiva/sclera: Conjunctivae normal.     Pupils: Pupils are equal, round, and reactive to light.  Cardiovascular:     Rate and Rhythm: Normal rate and regular rhythm.     Heart sounds: No murmur heard. Pulmonary:     Effort: Pulmonary effort is normal. No respiratory distress.     Breath sounds: No stridor. No wheezing, rhonchi or rales.  Musculoskeletal:     Cervical back: Neck supple.  Lymphadenopathy:     Cervical: No cervical adenopathy.  Skin:    Capillary Refill: Capillary refill takes less than 2 seconds.     Coloration: Skin is not jaundiced or pale.  Neurological:     General: No focal deficit present.     Mental Status: She is alert and oriented to person, place, and time.  Psychiatric:        Behavior: Behavior normal.      UC Treatments / Results  Labs (all labs ordered are listed, but only abnormal results are displayed) Labs Reviewed  POCT RAPID STREP A, ED / UC - Abnormal; Notable for the following components:      Result Value   Streptococcus, Group A Screen (Direct) POSITIVE (*)    All other components within normal limits    EKG   Radiology No results found.  Procedures Procedures (including critical care time)  Medications Ordered in UC Medications - No data to display  Initial Impression / Assessment and Plan / UC Course  I have reviewed the  triage vital signs and the nursing notes.  Pertinent labs & imaging results that were available during my care of the patient were reviewed by me and  considered in my medical decision making (see chart for details).        Strep test is positive and Amoxil is sent to treat.  Ibuprofen is sent in for pain Final Clinical Impressions(s) / UC Diagnoses   Final diagnoses:  Strep pharyngitis     Discharge Instructions      Strep test is positive 2 days into taking the antibiotics, throw away the toothbrush and begin using a new one. Patient is not contagious after 24 hours of antibiotics; a full 10 days should be completed to prevent rheumatic fever  Take amoxicillin 875 mg--1 tab twice daily for 10 days  Take ibuprofen 600 mg--1 tab every 8 hours as needed for pain.(It will help for fever also)        ED Prescriptions     Medication Sig Dispense Auth. Provider   ibuprofen (ADVIL) 600 MG tablet Take 1 tablet (600 mg total) by mouth every 8 (eight) hours as needed (pain). 15 tablet Virginia Francisco, Janace Aris, MD   amoxicillin (AMOXIL) 875 MG tablet Take 1 tablet (875 mg total) by mouth 2 (two) times daily for 10 days. 20 tablet Addisynn Vassell, Janace Aris, MD      PDMP not reviewed this encounter.   Zenia Resides, MD 06/21/22 1135

## 2022-06-25 ENCOUNTER — Other Ambulatory Visit: Payer: Self-pay | Admitting: Student

## 2022-07-12 DIAGNOSIS — Z419 Encounter for procedure for purposes other than remedying health state, unspecified: Secondary | ICD-10-CM | POA: Diagnosis not present

## 2022-07-17 ENCOUNTER — Ambulatory Visit (HOSPITAL_COMMUNITY)
Admission: EM | Admit: 2022-07-17 | Discharge: 2022-07-17 | Disposition: A | Discharge status: Home / Self Care | Payer: Medicaid Other | Attending: Emergency Medicine | Admitting: Emergency Medicine

## 2022-07-17 ENCOUNTER — Encounter (HOSPITAL_COMMUNITY): Payer: Self-pay

## 2022-07-17 DIAGNOSIS — J029 Acute pharyngitis, unspecified: Secondary | ICD-10-CM

## 2022-07-17 LAB — POCT RAPID STREP A, ED / UC: Streptococcus, Group A Screen (Direct): NEGATIVE

## 2022-07-17 MED ORDER — ACETAMINOPHEN 325 MG PO TABS
ORAL_TABLET | ORAL | Status: AC
  Filled 2022-07-17: qty 2

## 2022-07-17 MED ORDER — ACETAMINOPHEN 325 MG PO TABS
650.0000 mg | ORAL_TABLET | Freq: Once | ORAL | Status: AC
  Administered 2022-07-17: 650 mg via ORAL

## 2022-07-17 NOTE — Discharge Instructions (Addendum)
Please take tylenol and ibuprofen every 4-6 hours for throat pain  Change your toothbrush again  Please see the throat specialist if symptoms persist

## 2022-07-17 NOTE — ED Provider Notes (Signed)
[place]    CSN: [id number] Arrival date & time: 07/17/22  1516      History   Chief Complaint Chief Complaint  Patient presents with   Sore Throat    HPI Diana Hopkins is a 27 y.o. female.  Medical interpreter used for this encounter Presents with 4-day history of sore throat 8/10 pain with swallowing She has not taken any medications for symptoms No fevers  She had strep 1/11.  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 Authorizing Provider  cetirizine (ZYRTEC) 10 MG tablet Take 1 tablet (10 mg total) by mouth daily. 01/12/22   Shary Key, DO  Elastic Bandages & Supports (MEDICAL COMPRESSION STOCKINGS) MISC 1 each by Does not apply route daily. 03/07/22   Alcus Dad, MD  ibuprofen (ADVIL) 600 MG tablet Take 1 tablet (600 mg total) by  mouth every 8 (eight) hours as needed (pain). 06/21/22   Barrett Henle, MD  levonorgestrel (LILETTA, 52 MG,) 20.1 MCG/DAY IUD 1 each by Intrauterine route once.    [provider]  LORazepam (ATIVAN) 0.5 MG tablet Take 1 tablet (0.5 mg total) by mouth every 8 (eight) hours as needed for anxiety. 03/04/22   Hayden Rasmussen, MD  omeprazole (PRILOSEC) 20 MG capsule Take 1 capsule (20 mg total) by mouth daily. 03/08/22   Elmore Guise, DO  omeprazole (PRILOSEC) 40 MG capsule Take 1 capsule (40 mg total) by mouth daily. 03/08/22   Rodena Goldmann A, DO  sertraline (ZOLOFT) 100 MG tablet Take 1 tablet (100 mg total) by mouth daily. 02/20/22 06/20/22  Holley Bouche, MD    Family History Family History  Problem Relation Age of Onset   Hepatitis B Father     Social History Social History   Tobacco Use   Smoking status: Never   Smokeless tobacco: Never  Vaping Use   Vaping Use: Never used  Substance Use Topics   Alcohol use: Never   Drug use: Never  Allergies   Patient has no known allergies.   Review of Systems Review of Systems As per HPI  Physical Exam Triage Vital Signs ED Triage Vitals [07/17/22 1633]  Enc Vitals Group     BP 121/77     Pulse Rate 95     Resp 16     Temp 97.6 F (36.4 C)     Temp Source Oral     SpO2 97 %     Weight      Height      Head Circumference      Peak Flow      Pain Score 8     Pain Loc      Pain Edu?      Excl. in [place]?    No data found.  Updated Vital Signs BP 121/77 (BP Location: Left Arm)   Pulse 95   Temp 97.6 F (36.4 C) (Oral)   Resp 16   SpO2 97%    Physical Exam Vitals and nursing note reviewed.  Constitutional:      Appearance: She is not ill-appearing.  HENT:     Nose: No congestion or rhinorrhea.     Mouth/Throat:     Mouth: Mucous membranes are moist.     Pharynx: Oropharynx is clear. No posterior oropharyngeal erythema.  Eyes:     Conjunctiva/sclera: Conjunctivae normal.   Cardiovascular:     Rate and Rhythm: Normal rate and regular rhythm.     Pulses: Normal pulses.     Heart sounds: Normal heart sounds.  Pulmonary:     Effort: Pulmonary effort is normal.     Breath sounds: Normal breath sounds.  Musculoskeletal:     Cervical back: Normal range of motion.  Lymphadenopathy:     Cervical: No cervical adenopathy.  Skin:    General: Skin is warm and dry.  Neurological:     Mental Status: She is alert and oriented to person, place, and time.     UC Treatments / Results  Labs (all labs ordered are listed, but only abnormal results are displayed) Labs Reviewed  POCT RAPID STREP A, ED / UC    EKG   Radiology No results found.  Procedures Procedures (including critical care time)  Medications Ordered in UC Medications  acetaminophen (TYLENOL) tablet 650 mg (650 mg Oral Given 07/17/22 1703)    Initial Impression / Assessment and Plan / UC Course  I have reviewed the triage vital signs and the nursing notes.  Pertinent labs & imaging results that were available during my care of the patient were reviewed by me and considered in my medical decision making (see chart for details).  Well appearing, afebrile Strep negative Discussed could be viral. Symptomatic care at home. Otherwise follow with throat specialist Return precautions discussed. Patient agrees to plan  Final Clinical Impressions(s) / UC Diagnoses   Final diagnoses:  Viral pharyngitis     Discharge Instructions      Please take tylenol and ibuprofen every 4-6 hours for throat pain  Change your toothbrush again  Please see the throat specialist if symptoms persist    ED Prescriptions   None    PDMP not reviewed this encounter.   Vivienne Sangiovanni, Vernice Jefferson 07/17/22 1443

## 2022-07-17 NOTE — ED Triage Notes (Signed)
Per interpreter- Patient c/o sore throat x 3-4 days.  Patient states she has not taken any medications for her symptoms.

## 2022-08-10 DIAGNOSIS — Z419 Encounter for procedure for purposes other than remedying health state, unspecified: Secondary | ICD-10-CM | POA: Diagnosis not present

## 2022-08-11 ENCOUNTER — Encounter (HOSPITAL_COMMUNITY): Payer: Self-pay

## 2022-08-11 ENCOUNTER — Ambulatory Visit (HOSPITAL_COMMUNITY)
Admission: EM | Admit: 2022-08-11 | Discharge: 2022-08-11 | Disposition: A | Discharge status: Home / Self Care | Payer: Medicaid Other | Attending: Nurse Practitioner | Admitting: Nurse Practitioner

## 2022-08-11 DIAGNOSIS — R319 Hematuria, unspecified: Secondary | ICD-10-CM | POA: Insufficient documentation

## 2022-08-11 DIAGNOSIS — N390 Urinary tract infection, site not specified: Secondary | ICD-10-CM | POA: Diagnosis not present

## 2022-08-11 LAB — POCT URINALYSIS DIPSTICK, ED / UC
Bilirubin Urine: NEGATIVE
Glucose, UA: NEGATIVE mg/dL
Ketones, ur: NEGATIVE mg/dL
Nitrite: NEGATIVE
Protein, ur: 100 mg/dL — AB
Specific Gravity, Urine: 1.02 (ref 1.005–1.030)
Urobilinogen, UA: 0.2 mg/dL (ref 0.0–1.0)
pH: 7.5 (ref 5.0–8.0)

## 2022-08-11 MED ORDER — ACETAMINOPHEN 325 MG PO TABS
975.0000 mg | ORAL_TABLET | Freq: Once | ORAL | Status: AC
  Administered 2022-08-11: 975 mg via ORAL

## 2022-08-11 MED ORDER — ACETAMINOPHEN 325 MG PO TABS
ORAL_TABLET | ORAL | Status: AC
  Filled 2022-08-11: qty 3

## 2022-08-11 MED ORDER — NITROFURANTOIN MONOHYD MACRO 100 MG PO CAPS: 100.0000 mg | ORAL_CAPSULE | Freq: Two times a day (BID) | ORAL | 0 refills | Status: DC

## 2022-08-11 NOTE — Discharge Instructions (Addendum)
Your urinalysis is positive for bacteria. Urine culture pending this confirms the type of bacteria and if the treatment is effective. Due to your symptoms and history you have been started on a treatment of Macrobid twice a day for 5 days.  Encourage completion of your treatment even when symptoms improve.  Discussed resistance with antibiotic if over used.   Discussed allergic reactions with antibiotics Encourage increasing hydration with water.  Add cranberry juice 100% 8 -16 ozs daily until symptoms improve Discussed hygiene and voiding when the urge presents

## 2022-08-11 NOTE — ED Provider Notes (Signed)
[place]    CSN: [id number] Arrival date & time: 08/11/22  1007      History   Chief Complaint No chief complaint on file.   HPI Diana Hopkins is a 26 y.o. female.   HPI  She is in today for dysuria. Denies any pelvic pain or tenderness, amenorrhea irregular bleeding or prolonged heavy bleeding.  Denies vaginal discharge.  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 Authorizing Provider  escitalopram (LEXAPRO) 10 MG tablet Take 10 mg by mouth at bedtime. 03/06/22  Yes [provider]  ibuprofen (ADVIL) 600 MG tablet Take 1 tablet (600 mg total) by mouth every 8 (eight) hours as needed (pain). 06/21/22  Yes Barrett Henle, MD  levonorgestrel (LILETTA, 52 MG,) 20.1 MCG/DAY IUD 1 each by Intrauterine route once.   Yes [provider]  LORazepam (ATIVAN) 0.5 MG tablet Take 1 tablet (0.5 mg total) by  mouth every 8 (eight) hours as needed for anxiety. 03/04/22  Yes Hayden Rasmussen, MD  mirtazapine (REMERON) 15 MG tablet Take 15 mg by mouth at bedtime. 03/06/22  Yes [provider]  nitrofurantoin, macrocrystal-monohydrate, (MACROBID) 100 MG capsule Take 1 capsule (100 mg total) by mouth 2 (two) times daily. 08/11/22  Yes Vevelyn Francois, NP  omeprazole (PRILOSEC) 20 MG capsule Take 1 capsule (20 mg total) by mouth daily. 03/08/22  Yes Elmore Guise, DO    Family History Family History  Problem Relation Age of Onset   Hepatitis B Father     Social History Social History   Tobacco Use   Smoking status: Never   Smokeless tobacco: Never  Vaping Use   Vaping Use: Never used  Substance Use Topics   Alcohol use: Never   Drug use: Never     Allergies   Patient has no known allergies.   Review of Systems Review of Systems   Physical Exam Triage Vital Signs ED Triage Vitals  Enc Vitals Group     BP 08/11/22 1043 107/61     Pulse Rate 08/11/22 1043 (!)  104     Resp 08/11/22 1043 16     Temp 08/11/22 1043 98.4 F (36.9 C)     Temp Source 08/11/22 1043 Oral     SpO2 08/11/22 1043 96 %     Weight --      Height --      Head Circumference --      Peak Flow --      Pain Score 08/11/22 1039 5     Pain Loc --      Pain Edu? --      Excl. in [place]? --    No data found.  Updated Vital Signs BP 107/61 (BP Location: Left Arm)   Pulse (!) 104   Temp 98.4 F (36.9 C) (Oral)   Resp 16   LMP  (LMP Unknown)   SpO2 96%   Visual Acuity Right Eye Distance:   Left Eye Distance:   Bilateral Distance:    Right Eye Near:   Left Eye Near:    Bilateral Near:     Physical Exam   UC Treatments / Results  Labs (all labs ordered are listed, but only abnormal results are displayed) Labs Reviewed  POCT URINALYSIS DIPSTICK, ED / UC - Abnormal; Notable for the following components:      Result Value   Hgb urine dipstick MODERATE (*)    Protein, ur 100 (*)     Leukocytes,Ua MODERATE (*)    All other components within normal limits  URINE CULTURE    EKG   Radiology No results found.  Procedures Procedures (including critical care time)  Medications Ordered in UC Medications  acetaminophen (TYLENOL) tablet 975 mg (has no administration in time range)    Initial Impression / Assessment and Plan / UC Course  I have reviewed the triage vital signs and the nursing notes.  Pertinent labs & imaging results that were available during my care of the patient were reviewed by me and considered in my medical decision making (see chart for details).     dysuria Final Clinical Impressions(s) / UC Diagnoses   Final diagnoses:  Urinary tract infection with hematuria, site unspecified     Discharge Instructions      Your urinalysis is positive for bacteria. Urine culture pending this confirms the type of bacteria and if the treatment is effective. Due to your symptoms and history you have been started on a treatment of Macrobid twice a day for 5 days.  Encourage completion of your treatment even when symptoms improve.  Discussed resistance with antibiotic if over used.   Discussed allergic reactions with antibiotics Encourage increasing hydration with water.  Add cranberry juice 100% 8 -16 ozs daily until symptoms improve Discussed hygiene and voiding when the urge presents      ED Prescriptions     Medication Sig Dispense Auth. Provider   nitrofurantoin, macrocrystal-monohydrate, (MACROBID) 100 MG capsule Take 1 capsule (100 mg total) by mouth 2 (two) times daily. 10 capsule Vevelyn Francois, NP      PDMP not reviewed this encounter.   Dionisio David [place], [place] 08/13/22 [phone]

## 2022-08-11 NOTE — ED Notes (Signed)
D/c instructions to pt via interpretor.

## 2022-08-11 NOTE — ED Triage Notes (Signed)
[facility] ID# [id number]  Pt states that burning sensation in vagina and under stomach. Burning while peeing.

## 2022-08-13 LAB — URINE CULTURE: Culture: >=100000 — AB

## 2022-08-16 ENCOUNTER — Encounter: Payer: Self-pay | Admitting: Family Medicine

## 2022-08-16 ENCOUNTER — Ambulatory Visit (INDEPENDENT_AMBULATORY_CARE_PROVIDER_SITE_OTHER): Payer: Medicaid Other | Admitting: Family Medicine

## 2022-08-16 ENCOUNTER — Other Ambulatory Visit (HOSPITAL_COMMUNITY)
Admission: RE | Admit: 2022-08-16 | Discharge: 2022-08-16 | Disposition: A | Discharge status: Home / Self Care | Payer: Medicaid Other | Source: Ambulatory Visit | Attending: Family Medicine | Admitting: Family Medicine

## 2022-08-16 VITALS — BP 106/71 | HR 92 | Ht 62.0 in | Wt 172.6 lb

## 2022-08-16 DIAGNOSIS — Z113 Encounter for screening for infections with a predominantly sexual mode of transmission: Secondary | ICD-10-CM

## 2022-08-16 DIAGNOSIS — B9689 Other specified bacterial agents as the cause of diseases classified elsewhere: Secondary | ICD-10-CM | POA: Diagnosis not present

## 2022-08-16 DIAGNOSIS — R300 Dysuria: Secondary | ICD-10-CM

## 2022-08-16 DIAGNOSIS — N760 Acute vaginitis: Secondary | ICD-10-CM | POA: Diagnosis not present

## 2022-08-16 LAB — POCT URINALYSIS DIP (MANUAL ENTRY)
Bilirubin, UA: NEGATIVE
Glucose, UA: NEGATIVE mg/dL
Ketones, POC UA: NEGATIVE mg/dL
Nitrite, UA: NEGATIVE
Protein Ur, POC: NEGATIVE mg/dL
Spec Grav, UA: >=1.03 — AB (ref 1.010–1.025)
Urobilinogen, UA: 0.2 E.U./dL
pH, UA: 5.5 (ref 5.0–8.0)

## 2022-08-16 LAB — POCT WET PREP (WET MOUNT)
Clue Cells Wet Prep Whiff POC: POSITIVE
Trichomonas Wet Prep HPF POC: ABSENT

## 2022-08-16 LAB — POCT UA - MICROSCOPIC ONLY

## 2022-08-16 MED ORDER — METRONIDAZOLE 500 MG PO TABS: 500.0000 mg | ORAL_TABLET | Freq: Two times a day (BID) | ORAL | 0 refills | Status: AC

## 2022-08-16 NOTE — Progress Notes (Signed)
    SUBJECTIVE:   CHIEF COMPLAINT / HPI:   Dysuria -diagnosed with UTI on 3/2 -urine cx grew E Coli, sensitive to macrobid which is what she took for 5 days -completed abx yesterday -still having dysuria -also feels itchy/burning AFTER urination -no odor or vaginal discharge -would also like STI testing; no known exposures but husband is away for days at a time and she just wants to be sure  PERTINENT  PMH / PSH: anxiety  OBJECTIVE:   BP 106/71   Pulse 92   Ht 5\' 2"  (1.575 m)   Wt 172 lb 9.6 oz (78.3 kg)   LMP  (LMP Unknown)   SpO2 100%   BMI 31.57 kg/m   General: NAD, pleasant, able to participate in exam Respiratory: No respiratory distress Skin: warm and dry, no rashes noted Psych: Normal affect and mood Neuro: grossly intact GU/GYN: Exam performed in the presence of a chaperone, [employer] CMA. External genitalia within normal limits.  Vaginal mucosa pink, moist, normal rugae.  Nonfriable cervix without lesions, no discharge or bleeding noted on speculum exam. IUD strings visualized.     ASSESSMENT/PLAN:   Dysuria Recently completed course of Macrobid for UTI but still symptomatic. Symptoms may be secondary to BV which was detected today. However, UA shows 2+ leuks and small blood, so will send for culture and f/u results.  BV (bacterial vaginosis) Wet prep obtained today was consistent with BV. Rx sent for Metronidazole 500mg  PO BID x7 days.  Screen for STD (sexually transmitted disease) gonorrhea, chlamydia, HIV, and RPR obtained today  In-person [name] present for duration of encounter.  Alcus Dad, MD [place]

## 2022-08-16 NOTE — Patient Instructions (Addendum)
It was great to see you!  Your testing was positive for bacterial vaginosis (overgrowth of normal bacteria). I sent a medication to your pharmacy to take twice daily for 7 days.  We will send your urine for culture which takes a few days. We also checked for sexually transmitted infections today. I will call you with the results in a few days.   Take care, Dr Rock Nephew

## 2022-08-17 DIAGNOSIS — R300 Dysuria: Secondary | ICD-10-CM | POA: Insufficient documentation

## 2022-08-17 DIAGNOSIS — Z113 Encounter for screening for infections with a predominantly sexual mode of transmission: Secondary | ICD-10-CM | POA: Insufficient documentation

## 2022-08-17 DIAGNOSIS — B9689 Other specified bacterial agents as the cause of diseases classified elsewhere: Secondary | ICD-10-CM | POA: Insufficient documentation

## 2022-08-17 LAB — CERVICOVAGINAL ANCILLARY ONLY
Bacterial Vaginitis (gardnerella): POSITIVE — AB
Candida Glabrata: NEGATIVE
Candida Vaginitis: NEGATIVE
Chlamydia: NEGATIVE
Comment: NEGATIVE
Comment: NEGATIVE
Comment: NEGATIVE
Comment: NEGATIVE
Comment: NEGATIVE
Comment: NORMAL
Neisseria Gonorrhea: NEGATIVE
Trichomonas: NEGATIVE

## 2022-08-17 LAB — HIV ANTIBODY (ROUTINE TESTING W REFLEX): HIV Screen 4th Generation wRfx: NONREACTIVE

## 2022-08-17 LAB — RPR: RPR Ser Ql: NONREACTIVE

## 2022-08-17 NOTE — Assessment & Plan Note (Signed)
Recently completed course of Macrobid for UTI but still symptomatic. Symptoms may be secondary to BV which was detected today. However, UA shows 2+ leuks and small blood, so will send for culture and f/u results.

## 2022-08-17 NOTE — Assessment & Plan Note (Addendum)
Wet prep obtained today was consistent with BV. Rx sent for Metronidazole '500mg'$  PO BID x7 days.

## 2022-08-17 NOTE — Assessment & Plan Note (Signed)
gonorrhea, chlamydia, HIV, and RPR obtained today

## 2022-08-18 LAB — URINE CULTURE

## 2022-08-22 ENCOUNTER — Other Ambulatory Visit: Payer: Self-pay

## 2022-08-22 ENCOUNTER — Emergency Department (HOSPITAL_COMMUNITY)
Admission: EM | Admit: 2022-08-22 | Discharge: 2022-08-22 | Discharge status: Against Medical Advice | Payer: Medicaid Other | Attending: Emergency Medicine | Admitting: Emergency Medicine

## 2022-08-22 DIAGNOSIS — R519 Headache, unspecified: Secondary | ICD-10-CM | POA: Diagnosis not present

## 2022-08-22 DIAGNOSIS — Z5321 Procedure and treatment not carried out due to patient leaving prior to being seen by health care provider: Secondary | ICD-10-CM | POA: Diagnosis not present

## 2022-08-22 NOTE — ED Notes (Signed)
Pt was called x3 no answer, and NT checked Triage

## 2022-08-22 NOTE — ED Triage Notes (Signed)
Pt (via dari interpreter) says around 2100 she has right sided mouth and and head pain. Denies fever.  She took tylenol, ibuprofen without relief. No obvious swelling, no difficulty swallowing. No nausea, vomiting or swelling. Hx of frequent headaches

## 2022-09-02 ENCOUNTER — Ambulatory Visit (HOSPITAL_COMMUNITY)
Admission: EM | Admit: 2022-09-02 | Discharge: 2022-09-02 | Disposition: A | Discharge status: Home / Self Care | Payer: Medicaid Other | Attending: Nurse Practitioner | Admitting: Nurse Practitioner

## 2022-09-02 ENCOUNTER — Encounter (HOSPITAL_COMMUNITY): Payer: Self-pay

## 2022-09-02 DIAGNOSIS — G4700 Insomnia, unspecified: Secondary | ICD-10-CM | POA: Diagnosis not present

## 2022-09-02 DIAGNOSIS — Z20818 Contact with and (suspected) exposure to other bacterial communicable diseases: Secondary | ICD-10-CM | POA: Diagnosis not present

## 2022-09-02 DIAGNOSIS — R519 Headache, unspecified: Secondary | ICD-10-CM | POA: Diagnosis not present

## 2022-09-02 DIAGNOSIS — J029 Acute pharyngitis, unspecified: Secondary | ICD-10-CM | POA: Diagnosis not present

## 2022-09-02 LAB — POCT RAPID STREP A, ED / UC: Streptococcus, Group A Screen (Direct): NEGATIVE

## 2022-09-02 MED ORDER — HYDROXYZINE HCL 25 MG PO TABS: 25.0000 mg | ORAL_TABLET | Freq: Three times a day (TID) | ORAL | 0 refills | Status: DC | PRN

## 2022-09-02 MED ORDER — CEPHALEXIN 500 MG PO CAPS: 500.0000 mg | ORAL_CAPSULE | Freq: Two times a day (BID) | ORAL | 0 refills | Status: AC

## 2022-09-02 NOTE — ED Triage Notes (Signed)
Patient is here today with c/o right side HA, a humming in her ears X 1 week and insomnia. She states that she has not slept for the past week. She has not been able to sleep. Also ST X 2 days with no other symptoms. She has been taking ibuprofen with no relief.

## 2022-09-02 NOTE — ED Provider Notes (Signed)
[place]    CSN: [id number] Arrival date & time: 09/02/22  1035      History   Chief Complaint Chief Complaint  Patient presents with   Insomnia    HPI Diana Hopkins is a 27 y.o. female.   Patient presents today for sore throat, headache, and insomnia.  Medical interpreter was utilized for today's visit.  She reports 2-day history of sore throat, headache, and humming in her ears.  No fever, body aches, chills, cough, shortness of breath or chest pain, runny or stuffy nose, abdominal pain, nausea/vomiting, or diarrhea.  Reports she has taken ibuprofen without benefit.  Patient's daughter tested positive for strep throat today.  Patient also reports she has not been able to sleep for the past week.  She is requesting something over and over to help with sleep.  Reports she stopped taking Lexapro and Remeron because she was feeling better.  Reports she tried taking both of these medicines last week, however it made her feel bad, and then she stopped again.  Patient has an IUD and is confident she is not pregnant.    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 Authorizing Provider  cephALEXin (KEFLEX) 500 MG capsule Take 1 capsule (500 mg total) by mouth 2 (two) times daily for 10 days. 09/02/22 09/12/22 Yes Noemi Chapel A, NP  escitalopram (LEXAPRO) 10 MG tablet Take 10 mg by mouth at bedtime. 03/06/22  Yes [provider]  hydrOXYzine (ATARAX) 25 MG tablet Take 1 tablet (25 mg total) by mouth every 8 (eight) hours as needed for anxiety. 09/02/22  Yes Noemi Chapel A, NP  ibuprofen (ADVIL) 600 MG tablet Take 1 tablet (600 mg total) by mouth every 8 (eight) hours as needed (pain). 06/21/22  Yes Barrett Henle, MD  levonorgestrel (LILETTA, 52 MG,) 20.1 MCG/DAY IUD 1 each by Intrauterine route once.   Yes [provider]  LORazepam (ATIVAN) 0.5 MG tablet Take 1 tablet (0.5 mg total) by  mouth every 8 (eight) hours as needed for anxiety. 03/04/22  Yes Hayden Rasmussen, MD  mirtazapine (REMERON) 15 MG tablet Take 15 mg by mouth at bedtime. 03/06/22  Yes [provider]  omeprazole (PRILOSEC) 20 MG capsule Take 1 capsule (20 mg total) by mouth daily. 03/08/22  Yes Elmore Guise, DO    Family History Family History  Problem Relation Age of Onset   Hepatitis B Father     Social History Social History   Tobacco Use   Smoking status: Never   Smokeless tobacco: Never  Vaping Use   Vaping Use: Never used  Substance Use Topics   Alcohol use: Never   Drug use: Never     Allergies   Patient has no known allergies.   Review of Systems Review of Systems Per HPI  Physical Exam Triage Vital Signs ED Triage Vitals  Enc Vitals Group     BP 09/02/22 1142 94/62     Pulse  Rate 09/02/22 1142 (!) 58     Resp 09/02/22 1142 15     Temp 09/02/22 1142 97.6 F (36.4 C)     Temp Source 09/02/22 1142 Oral     SpO2 09/02/22 1142 96 %     Weight --      Height --      Head Circumference --      Peak Flow --      Pain Score 09/02/22 1140 8     Pain Loc --      Pain Edu? --      Excl. in [place]? --    No data found.  Updated Vital Signs BP 94/62 (BP Location: Left Arm)   Pulse (!) 58   Temp 97.6 F (36.4 C) (Oral)   Resp 15   LMP  (LMP Unknown)   SpO2 96%   Breastfeeding No   Visual Acuity Right Eye Distance:   Left Eye Distance:   Bilateral Distance:    Right Eye Near:   Left Eye Near:    Bilateral Near:     Physical Exam Vitals and nursing note reviewed.  Constitutional:      General: She is not in acute distress.    Appearance: Normal appearance. She is not ill-appearing or toxic-appearing.  HENT:     Head: Normocephalic and atraumatic.     Right Ear: Tympanic membrane, ear canal and external ear normal.     Left Ear: Tympanic membrane, ear canal and external ear normal.     Nose: No congestion or rhinorrhea.     Mouth/Throat:     Mouth: Mucous membranes are moist.     Pharynx: Oropharynx is clear. Posterior oropharyngeal erythema present. No oropharyngeal exudate or uvula swelling.     Tonsils: No tonsillar exudate. 1+ on the right. 1+ on the left.  Eyes:     General: No scleral icterus.    Extraocular Movements: Extraocular movements intact.  Cardiovascular:     Rate and Rhythm: Normal rate and regular rhythm.  Pulmonary:     Effort: Pulmonary effort is normal. No respiratory distress.     Breath sounds: Normal breath sounds. No wheezing, rhonchi or rales.  Abdominal:     General: Abdomen is flat. Bowel sounds are normal. There is no distension.     Palpations: Abdomen is soft.  Musculoskeletal:     Cervical back: Normal range of motion and neck supple.  Lymphadenopathy:     Cervical: No cervical adenopathy.  Skin:  General:  Skin is warm and dry.     Coloration: Skin is not jaundiced or pale.     Findings: No erythema or rash.  Neurological:     Mental Status: She is alert and oriented to person, place, and time.  Psychiatric:        Behavior: Behavior is cooperative.      UC Treatments / Results  Labs (all labs ordered are listed, but only abnormal results are displayed) Labs Reviewed  CULTURE, GROUP A STREP [facility])  POCT RAPID STREP A, ED / UC    EKG   Radiology No results found.  Procedures Procedures (including critical care time)  Medications Ordered in UC Medications - No data to display  Initial Impression / Assessment and Plan / UC Course  I have reviewed the triage vital signs and the nursing notes.  Pertinent labs & imaging results that were available during my care of the patient were reviewed by me and considered in my medical decision making (see chart for details).   Patient is well-appearing, normotensive, afebrile, not tachycardic, not tachypneic, oxygenating well on room air.    1. Acute pharyngitis, unspecified etiology 2. Exposure to strep throat 3. Acute nonintractable headache, unspecified headache type Rapid strep throat test today is negative Given known exposure and development of symptoms along the same timeframe, suspect strep pharyngitis Throat culture is pending Will treat empirically with Keflex twice daily for 10 days Change toothbrush after starting treatment Other supportive care discussed ER and return precautions discussed with patient Recommended follow-up with primary care provider for persistent or worsening symptoms despite treatment  4. Insomnia, unspecified type Suspect secondary to self discontinuance of medications for anxiety/sleep Recommended resuming these medications-Lexapro and Remeron In meantime, can use hydroxyzine as needed to help with sleep Discussed and recommended follow-up with primary care provider if these medicines do not help  and if sleep does not improve with hydroxyzine  The patient was given the opportunity to ask questions.  All questions answered to their satisfaction.  The patient is in agreement to this plan.    Final Clinical Impressions(s) / UC Diagnoses   Final diagnoses:  Acute pharyngitis, unspecified etiology  Acute nonintractable headache, unspecified headache type  Exposure to strep throat  Insomnia, unspecified type     Discharge Instructions      Please start back on the anxiety and sleep medication - Lexapro and Remeron.  You can take hydroxyzine 25 mg every 8 hours as needed for sleep.  If this does not help with your symptoms, follow up with your PCP this week.  Since you have been exposed to strep throat and are having similar symptoms, please start the Keflex and take it as prescribed.   Follow up with your PCP if your symptoms persist or worsen despite treatment.     ED Prescriptions     Medication Sig Dispense Auth. Provider   cephALEXin (KEFLEX) 500 MG capsule Take 1 capsule (500 mg total) by mouth 2 (two) times daily for 10 days. 20 capsule Noemi Chapel A, NP   hydrOXYzine (ATARAX) 25 MG tablet Take 1 tablet (25 mg total) by mouth every 8 (eight) hours as needed for anxiety. 12 tablet Eulogio Bear, NP      PDMP not reviewed this encounter.   Eulogio Bear, NP 09/02/22 1520

## 2022-09-02 NOTE — Discharge Instructions (Addendum)
Please start back on the anxiety and sleep medication - Lexapro and Remeron.  You can take hydroxyzine 25 mg every 8 hours as needed for sleep.  If this does not help with your symptoms, follow up with your PCP this week.  Since you have been exposed to strep throat and are having similar symptoms, please start the Keflex and take it as prescribed.   Follow up with your PCP if your symptoms persist or worsen despite treatment.

## 2022-09-04 LAB — CULTURE, GROUP A STREP (THRC)

## 2022-09-10 DIAGNOSIS — Z419 Encounter for procedure for purposes other than remedying health state, unspecified: Secondary | ICD-10-CM | POA: Diagnosis not present

## 2022-09-19 ENCOUNTER — Other Ambulatory Visit (HOSPITAL_BASED_OUTPATIENT_CLINIC_OR_DEPARTMENT_OTHER): Payer: Self-pay

## 2022-09-19 ENCOUNTER — Other Ambulatory Visit: Payer: Self-pay

## 2022-09-19 ENCOUNTER — Emergency Department (HOSPITAL_BASED_OUTPATIENT_CLINIC_OR_DEPARTMENT_OTHER)
Admission: EM | Admit: 2022-09-19 | Discharge: 2022-09-19 | Disposition: A | Discharge status: Home / Self Care | Payer: Medicaid Other | Attending: Emergency Medicine | Admitting: Emergency Medicine

## 2022-09-19 ENCOUNTER — Encounter (HOSPITAL_BASED_OUTPATIENT_CLINIC_OR_DEPARTMENT_OTHER): Payer: Self-pay

## 2022-09-19 ENCOUNTER — Emergency Department (HOSPITAL_BASED_OUTPATIENT_CLINIC_OR_DEPARTMENT_OTHER): Payer: Medicaid Other

## 2022-09-19 DIAGNOSIS — R519 Headache, unspecified: Secondary | ICD-10-CM | POA: Insufficient documentation

## 2022-09-19 LAB — BASIC METABOLIC PANEL
Anion gap: 9 (ref 5–15)
BUN: 11 mg/dL (ref 6–20)
CO2: 23 mmol/L (ref 22–32)
Calcium: 9.3 mg/dL (ref 8.9–10.3)
Chloride: 106 mmol/L (ref 98–111)
Creatinine, Ser: 0.46 mg/dL (ref 0.44–1.00)
GFR, Estimated: >60 mL/min (ref >60)
Glucose, Bld: 80 mg/dL (ref 70–99)
Potassium: 3.9 mmol/L (ref 3.5–5.1)
Sodium: 138 mmol/L (ref 135–145)

## 2022-09-19 LAB — CBC
HCT: 37.1 % (ref 36.0–46.0)
Hemoglobin: 12.4 g/dL (ref 12.0–15.0)
MCH: 28.6 pg (ref 26.0–34.0)
MCHC: 33.4 g/dL (ref 30.0–36.0)
MCV: 85.5 fL (ref 80.0–100.0)
Platelets: 246 10*3/uL (ref 150–400)
RBC: 4.34 MIL/uL (ref 3.87–5.11)
RDW: 13 % (ref 11.5–15.5)
WBC: 5 10*3/uL (ref 4.0–10.5)
nRBC: 0 % (ref 0.0–0.2)

## 2022-09-19 MED ORDER — SUMATRIPTAN SUCCINATE 100 MG PO TABS: 100.0000 mg | ORAL_TABLET | ORAL | 0 refills | Status: DC | PRN

## 2022-09-19 MED ORDER — KETOROLAC TROMETHAMINE 15 MG/ML IJ SOLN
15.0000 mg | Freq: Once | INTRAMUSCULAR | Status: AC
  Administered 2022-09-19: 15 mg via INTRAVENOUS
  Filled 2022-09-19: qty 1

## 2022-09-19 MED ORDER — SODIUM CHLORIDE 0.9 % IV BOLUS
1000.0000 mL | Freq: Once | INTRAVENOUS | Status: AC
  Administered 2022-09-19: 1000 mL via INTRAVENOUS

## 2022-09-19 MED ORDER — PROCHLORPERAZINE EDISYLATE 10 MG/2ML IJ SOLN
10.0000 mg | Freq: Once | INTRAMUSCULAR | Status: AC
  Administered 2022-09-19: 10 mg via INTRAVENOUS
  Filled 2022-09-19: qty 2

## 2022-09-19 MED ORDER — DIPHENHYDRAMINE HCL 50 MG/ML IJ SOLN
25.0000 mg | Freq: Once | INTRAMUSCULAR | Status: AC
  Administered 2022-09-19: 25 mg via INTRAVENOUS
  Filled 2022-09-19: qty 1

## 2022-09-19 NOTE — ED Notes (Signed)
Pts chart reopened due to Pt leaving her discharge paperwork in the room when leaving and it being shredded... Paperwork reprinted when pt came back for the paperwork.Marland KitchenMarland Kitchen

## 2022-09-19 NOTE — ED Notes (Addendum)
Discharge paperwork given and verbally understood. Pts husband interp over the phone due to facility interp not working for given language.

## 2022-09-19 NOTE — ED Notes (Signed)
Pt aware of the need for a urine... Unable to provide currently... 

## 2022-09-19 NOTE — ED Triage Notes (Signed)
Patient here POV from Home.  Endorses Right Headache. States Pain began Yesterday PM and has been present for 15 Hours. Associated with Dental Pain and Facial Pain.   States she was seen for Same Months ago and treated with medication for Dental pain which alleviated Symptoms. No Fevers. Ibuprofen without relief.   NAD Noted during Triage. A&Ox4. GCS 15. Ambulatory. Interpreter utilized for Triage.

## 2022-09-19 NOTE — Discharge Instructions (Addendum)
Note the workup today was overall reassuring.  CT imaging of your head was without abnormality.  Recommend follow-up with neurology for reassessment of your chronic headaches. Will send in medication to take as needed for migraine headache.   Please not hesitate to return to emergency department the worrisome signs and symptoms we discussed become apparent.

## 2022-09-19 NOTE — ED Provider Notes (Signed)
[place] EMERGENCY DEPARTMENT AT [facility] Provider Note   CSN: [id number] Arrival date & time: 09/19/22  1407     History  Chief Complaint  Patient presents with   Headache    Diana Hopkins is a 27 y.o. female.   Headache   27 year old female presents emergency department presents emergency department with complaints of headache.  Patient reports headache occurring around 1 AM this morning.  States gradual onset with worsening since onset.  States she is taken Motrin at home with no relief prompting visit to the emergency department.  States she has had similar headaches in the past and has had to come to the emergency department for relief via intravenous medications.  Denies fever, chest pain, shortness of breath, weakness/sensory deficits in upper or lower extremities, slurred speech, facial droop, double/blurry vision.  Past medical history significant for severe frontal headache, panic attack, anxiety, depression  Home Medications Prior to Admission medications   Medication Sig Start Date End Date Taking? Authorizing Provider  SUMAtriptan (IMITREX) 100 MG tablet Take 1 tablet (100 mg total) by mouth every 2 (two) hours as needed for migraine (Do not take more than 2 tablets in 24 hours). May repeat in 2 hours if headache persists or recurs. 09/19/22  Yes Sherian Maroon A, PA  escitalopram (LEXAPRO) 10 MG tablet Take 10 mg by mouth at bedtime. 03/06/22   [provider]  hydrOXYzine (ATARAX) 25 MG tablet Take 1 tablet (25 mg total) by mouth every 8 (eight) hours as needed for anxiety. 09/02/22   Valentino Nose, NP  ibuprofen (ADVIL) 600 MG tablet Take 1 tablet (600 mg total) by mouth every 8 (eight) hours as needed (pain). 06/21/22   Zenia Resides, MD  levonorgestrel (LILETTA, 52 MG,) 20.1 MCG/DAY IUD 1 each by Intrauterine route once.    [provider]  LORazepam (ATIVAN) 0.5 MG tablet Take 1 tablet (0.5 mg total) by mouth every 8 (eight)  hours as needed for anxiety. 03/04/22   Terrilee Files, MD  mirtazapine (REMERON) 15 MG tablet Take 15 mg by mouth at bedtime. 03/06/22   [provider]  omeprazole (PRILOSEC) 20 MG capsule Take 1 capsule (20 mg total) by mouth daily. 03/08/22   Claudie Leach, DO      Allergies    Patient has no known allergies.    Review of Systems   Review of Systems  Neurological:  Positive for headaches.  All other systems reviewed and are negative.   Physical Exam Updated Vital Signs BP 108/64 (BP Location: Right Arm)   Pulse 67   Temp (!) 97.5 F (36.4 C)   Resp 16   Ht 5\' 2"  (1.575 m)   Wt 78.3 kg   SpO2 100%   BMI 31.57 kg/m  Physical Exam Vitals and nursing note reviewed.  Constitutional:      General: She is not in acute distress.    Appearance: She is well-developed.  HENT:     Head: Normocephalic and atraumatic.  Eyes:     Conjunctiva/sclera: Conjunctivae normal.  Cardiovascular:     Rate and Rhythm: Normal rate and regular rhythm.     Heart sounds: No murmur heard. Pulmonary:     Effort: Pulmonary effort is normal. No respiratory distress.     Breath sounds: Normal breath sounds.  Abdominal:     Palpations: Abdomen is soft.     Tenderness: There is no abdominal tenderness.  Musculoskeletal:  General: No swelling.     Cervical back: Neck supple.  Skin:    General: Skin is warm and dry.     Capillary Refill: Capillary refill takes less than 2 seconds.  Neurological:     Mental Status: She is alert.     Comments: Alert and oriented to self, place, time and event.   Speech is fluent, clear without dysarthria or dysphasia.   Strength 5/5 in upper/lower extremities   Sensation intact in upper/lower extremities   Normal gait.  CN I not tested  CN II not tested CN III, IV, VI PERRLA and EOMs intact bilaterally  CN V Intact sensation to sharp and light touch to the face  CN VII facial movements symmetric  CN VIII not tested  CN IX, X no uvula  deviation, symmetric rise of soft palate  CN XI 5/5 SCM and trapezius strength bilaterally  CN XII Midline tongue protrusion, symmetric L/R movements     Psychiatric:        Mood and Affect: Mood normal.     ED Results / Procedures / Treatments   Labs (all labs ordered are listed, but only abnormal results are displayed) Labs Reviewed  BASIC METABOLIC PANEL  CBC  PREGNANCY, URINE    EKG None  Radiology CT Head Wo Contrast  Result Date: 09/19/2022 CLINICAL DATA:  Headache.  Increasing frequency. EXAM: CT HEAD WITHOUT CONTRAST TECHNIQUE: Contiguous axial images were obtained from the base of the skull through the vertex without intravenous contrast. RADIATION DOSE REDUCTION: This exam was performed according to the departmental dose-optimization program which includes automated exposure control, adjustment of the mA and/or kV according to patient size and/or use of iterative reconstruction technique. COMPARISON:  CT head dated April 29, 2022 FINDINGS: Brain: No evidence of acute infarction, hemorrhage, hydrocephalus, extra-axial collection or mass lesion/mass effect. Vascular: No hyperdense vessel or unexpected calcification. Skull: Normal. Negative for fracture or focal lesion. Sinuses/Orbits: No acute finding. Other: None. IMPRESSION: No acute intracranial pathology. Electronically Signed   By: Larose HiresImran  Ahmed D.O.   On: 09/19/2022 17:15    Procedures Procedures    Medications Ordered in ED Medications  sodium chloride 0.9 % bolus 1,000 mL (0 mLs Intravenous Stopped 09/19/22 1743)  prochlorperazine (COMPAZINE) injection 10 mg (10 mg Intravenous Given 09/19/22 1611)  diphenhydrAMINE (BENADRYL) injection 25 mg (25 mg Intravenous Given 09/19/22 1609)  ketorolac (TORADOL) 15 MG/ML injection 15 mg (15 mg Intravenous Given 09/19/22 1610)    ED Course/ Medical Decision Making/ A&P                             Medical Decision Making Amount and/or Complexity of Data Reviewed Labs:  ordered. Radiology: ordered.  Risk Prescription drug management.   This patient presents to the ED for concern of headache, this involves an extensive number of treatment options, and is a complaint that carries with it a high risk of complications and morbidity.  The differential diagnosis includes CVA, cerebral venous thrombosis, tension/cluster/migraine headache, coronary artery/vertebral artery dissection, pseudotumor cerebri, MS, malignancy meningitis/encephalitis   Co morbidities that complicate the patient evaluation  See HPI   Additional history obtained:  Additional history obtained from EMR External records from outside source obtained and reviewed including hospital records   Lab Tests:  I Ordered, and personally interpreted labs.  The pertinent results include: No leukocytosis noted.  No evidence of anemia.  Platelets within normal range.  No electrolyte abnormalities.  No renal dysfunction.   Imaging Studies ordered:  I ordered imaging studies including CT head I independently visualized and interpreted imaging which showed no acute intracranial process I agree with the radiologist interpretation   Cardiac Monitoring: / EKG:  The patient was maintained on a cardiac monitor.  I personally viewed and interpreted the cardiac monitored which showed an underlying rhythm of: Sinus rhythm   Consultations Obtained:  N/a   Problem List / ED Course / Critical interventions / Medication management  Headache I ordered medication including Compazine, Benadryl, Toradol, 1 L normal saline   Reevaluation of the patient after these medicines showed that the patient resolved I have reviewed the patients home medicines and have made adjustments as needed   Social Determinants of Health:  Denies tobacco, licit drug use.   Test / Admission - Considered:  Headache Vitals signs within normal range and stable throughout visit. Laboratory/imaging studies significant  for: See above 27 year old female presents emergency department with acute headache.  Patient states that headache is as severe as worst headache she's had in the past; CT imaging ordered which was negative for any acute intracranial process.  Patient had resolution of symptoms with administration of migraine cocktail.  Patient both before and after headache medicine was without neurologic deficit.  I do think that patient will benefit from abortive type medication in the form of Imitrex to take as needed as well as neurology follow-up given intermittent self-described severe headache.  Patient overall well-appearing, afebrile in no acute distress.  Low suspicion for CVA, cerebral venous thrombosis, pseudotumor cerebri.  Treatment plan discussed at length with patient and she acknowledged understanding was agreeable to said plan. Worrisome signs and symptoms were discussed with the patient, and the patient acknowledged understanding to return to the ED if noticed. Patient was stable upon discharge.          Final Clinical Impression(s) / ED Diagnoses Final diagnoses:  Acute nonintractable headache, unspecified headache type    Rx / DC Orders ED Discharge Orders          Ordered    SUMAtriptan (IMITREX) 100 MG tablet  Every 2 hours PRN        09/19/22 1748              Peter Garter, PA 09/19/22 1815    Mardene Sayer, MD 09/19/22 2320

## 2022-09-20 ENCOUNTER — Other Ambulatory Visit: Payer: Self-pay

## 2022-09-20 ENCOUNTER — Encounter: Payer: Self-pay | Admitting: Family Medicine

## 2022-09-20 ENCOUNTER — Ambulatory Visit (INDEPENDENT_AMBULATORY_CARE_PROVIDER_SITE_OTHER): Payer: Medicaid Other | Admitting: Family Medicine

## 2022-09-20 ENCOUNTER — Telehealth: Payer: Self-pay

## 2022-09-20 VITALS — BP 116/68 | HR 82 | Ht 62.0 in | Wt 176.0 lb

## 2022-09-20 DIAGNOSIS — G44209 Tension-type headache, unspecified, not intractable: Secondary | ICD-10-CM | POA: Diagnosis not present

## 2022-09-20 NOTE — Progress Notes (Signed)
    SUBJECTIVE:   CHIEF COMPLAINT / HPI:   She was seen in the ED yesterday 4/10 for headaches. Has been taking Motrin with no relief. In ED was given headache cocktail with Compazine, Benadryl, Toradol and 1 L normal saline. CT unremarkable. Was advised to follow up with neurology.  Today she endorses continued headaches wanting to know the cause. States the shot given yesterday in the ED improved the pain for about 5-6 hours. Has not picked up the medication (triptan) prescribed from ED. States headache wrosened after she had a tooth removed 5-6 months ago tho she did have some headaches prior. Pain mainly on right side of head and right side of jaw. Also has been having some right sided neck pain. No current vision changes, nausea, vomiting, extremity weakness. Has tried  Ibuprofen at home without relief.   PERTINENT  PMH / PSH: severe frontal headache, panic attack, anxiety, depression   OBJECTIVE:   BP 116/68   Pulse 82   Ht  (1.575 m)   Wt 176 lb (79.8 kg)   SpO2 100%   BMI 32.19 kg/m    Physical exam General: well appearing, NAD HEENT: multiple dental caries. No abscesses visualized  Cardiovascular: RRR, no murmurs Lungs: CTAB. Normal WOB Abdomen: soft, non-distended, non-tender Skin: warm, dry. No edema Neuro: Alert and oriented. CN 2-12 in tact. Normal sensation bilaterally   ASSESSMENT/PLAN:   Tension headache Patient with recurrent headaches not improved with OTC medication. Was seen in the ED yesterday and CT was unremarkable. She was given a migraine cocktail and sent home with Imitrex rx. She has not picked it up yet. Vitals stable today and neuro exam normal. No red flag symptoms at this time. I do think headache could also be dental related. Recommended starting the Imitrex to see if that helps. Will also refer to neurology. Advised her to schedule an appointment with her dentist. Strict return precautions discussed.  - Neurology referral placed      Cora Collum, DO [facility]

## 2022-09-20 NOTE — Transitions of Care (Post Inpatient/ED Visit) (Signed)
   09/20/2022  Name: Thandi Maphis MRN: [id number] DOB: Apr 05, 1996  Today's TOC FU Call Status: Today's TOC FU Call Status:: Successful TOC FU Call Competed TOC FU Call Complete Date: 09/20/22  Transition Care Management Follow-up Telephone Call Date of Discharge: 09/19/22 Discharge Facility: [facility] ([facility]) Type of Discharge: Emergency Department Reason for ED Visit:  (headaches) How have you been since you were released from the hospital?: Better Any questions or concerns?: No  Items Reviewed: Medications obtained and verified?: Yes (Medications Reviewed) Any new allergies since your discharge?: No Dietary orders reviewed?: NA Do you have support at home?: Yes People in Home: child(ren), dependent, spouse  Home Care and Equipment/Supplies: Were Home Health Services Ordered?: NA Any new equipment or medical supplies ordered?: NA  Functional Questionnaire: Do you need assistance with bathing/showering or dressing?: No Do you need assistance with meal preparation?: No Do you need assistance with eating?: No Do you have difficulty maintaining continence: No Do you need assistance with getting out of bed/getting out of a chair/moving?: No Do you have difficulty managing or taking your medications?: No  Follow up appointments reviewed: PCP Follow-up appointment confirmed?: Yes Date of PCP follow-up appointment?: 09/20/22 Specialist Hospital Follow-up appointment confirmed?: NA Do you need transportation to your follow-up appointment?: No Do you understand care options if your condition(s) worsen?: Yes-patient verbalized understanding   SDOH Interventions    Flowsheet Row Office Visit from 12/07/2020 in [facility]  SDOH Interventions   Food Insecurity Interventions Stage manager ([facility] only)      The patient was given information about care management services as a benefit of their Medicaid health  plan today.   Patient                                              did not agree to enrollment in care management services and does not wish to consider at this time.   Abelino Derrick, MHA [facility]  Managed [facility] Social Worker [phone]

## 2022-09-20 NOTE — Patient Instructions (Addendum)
It was great seeing you today!  Im sorry you have been experiencing bad headaches! As we discussed I recommend scheduling an appointment with your dentist as soon as possible to see if you have cavities that could be contributing to your pain.   Please pick up the medication at your pharmacy  I have placed a neurology referral. They will reach out to you but you can also call the office to schedule an appointment:   [facility] Address: [address], [place], [place] [address] Phone: [phone]  Take care,  Dr. Cora Collum [facility]  ????? ?? ????? ?????? ???  ?? ?????? ??? ??? ??? ????? ????? ??! ??????? ?? ?? ???? ??? ?? ????? ?? ??? ?????? ???? ???? ?????? ?????????? ??? ?? ?? ???? ??? ???? ???? ??? ??? ????? avavities ??? ?? ?? ????? ??? ?? ??? ??? ??.  ???? ???? ?? ?? ???????? ??? ?????? ????  ?? ????? ????? ?? ???? ???? ?? ??? ???? ?????? ???? ??? ??? ?????? ?? ?????? ?? ???? ???? ?????? ?? ???? ?????? ?? ?????? ???? ????:  ??? ?????? ? ?[id number], ?????????, [place] [address] ????: [phone]  ????? ??? ???? ???????? ?? ???

## 2022-09-21 NOTE — Assessment & Plan Note (Addendum)
Patient with recurrent headaches not improved with OTC medication. Was seen in the ED yesterday and CT was unremarkable. She was given a migraine cocktail and sent home with Imitrex rx. She has not picked it up yet. Vitals stable today and neuro exam normal. No red flag symptoms at this time. I do think headache could also be dental related. Recommended starting the Imitrex to see if that helps. Will also refer to neurology. Advised her to schedule an appointment with her dentist. Strict return precautions discussed.  - Neurology referral placed

## 2022-09-21 NOTE — Assessment & Plan Note (Signed)
>>  ASSESSMENT AND PLAN FOR TENSION HEADACHE WRITTEN ON 09/21/2022  4:03 PM BY PAIGE, VICTORIA J, DO  Patient with recurrent headaches not improved with OTC medication. Was seen in the ED yesterday and CT was unremarkable. She was given a migraine cocktail and sent home with Imitrex  rx. She has not picked it up yet. Vitals stable today and neuro exam normal. No red flag symptoms at this time. I do think headache could also be dental related. Recommended starting the Imitrex  to see if that helps. Will also refer to neurology. Advised her to schedule an appointment with her dentist. Strict return precautions discussed.  - Neurology referral placed

## 2022-09-21 NOTE — Assessment & Plan Note (Signed)
>>  ASSESSMENT AND PLAN FOR SEVERE FRONTAL HEADACHES WRITTEN ON 01/23/2024  1:06 PM BY Ebon Ketchum, MD   >>ASSESSMENT AND PLAN FOR TENSION HEADACHE WRITTEN ON 09/21/2022  4:03 PM BY PAIGE, VICTORIA J, DO  Patient with recurrent headaches not improved with OTC medication. Was seen in the ED yesterday and CT was unremarkable. She was given a migraine cocktail and sent home with Imitrex  rx. She has not picked it up yet. Vitals stable today and neuro exam normal. No red flag symptoms at this time. I do think headache could also be dental related. Recommended starting the Imitrex  to see if that helps. Will also refer to neurology. Advised her to schedule an appointment with her dentist. Strict return precautions discussed.  - Neurology referral placed

## 2022-09-30 NOTE — Progress Notes (Deleted)
NEUROLOGY CONSULTATION NOTE  Delrae Hagey MRN: [id number] DOB: [birth year]  Referring provider: Levert Feinstein, MD (ED referral) Primary care provider: Maury Dus, MD  Reason for consult:  headache  Assessment/Plan:   ***   Subjective:  Diana Hopkins is a 27 year old female who presents for headaches.  History supplemented by ED and family medicine notes.  History of frequent headaches ***.  She has had multiple ED visits for headaches.  ***.  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  MRA of neck performed same day was grossly normal.  During ED visits, she has had head CTs on 03/04/2022, 04/29/2022 and 09/19/2022 which were unremarkable.    Past NSAIDS/analgesics:  acetaminophen Past abortive triptans:  none Past abortive ergotamine:  none Past muscle relaxants:  none Past anti-emetic:  none Past antihypertensive medications:  none Past antidepressant medications:  amitriptyline, sertraline Past anticonvulsant medications:  none Past anti-CGRP:  none Past vitamins/Herbal/Supplements:  none Past antihistamines/decongestants:  Zyrtec, Benadryl, Flonase Other past therapies:  none  Current NSAIDS/analgesics:  ibuprofen Current triptans:  sumatriptan  Current ergotamine:  none Current anti-emetic:  none Current muscle relaxants:  none Current Antihypertensive medications:  none Current Antidepressant medications:  mirtazapine  QHS, escitalopram  QHS Current Anticonvulsant medications:  none Current anti-CGRP:  none Current Vitamins/Herbal/Supplements:  none Current Antihistamines/Decongestants:  none Other therapy:  none Birth control:  levonorgestrel Other medications:  hydroxyzine, lorazepam   Caffeine:  *** Alcohol:  *** Smoker:  *** Diet:  *** Exercise:   *** Depression:  ***; Anxiety:  *** Other pain:  *** Sleep hygiene:  *** Family history of headache:  ***      PAST MEDICAL HISTORY: Past Medical History:  Diagnosis Date   Cholestasis during pregnancy in third trimester    Cough 08/17/2021   Forearm pain 09/19/2021   Medical history non-contributory     PAST SURGICAL HISTORY: Past Surgical History:  Procedure Laterality Date   arm surgery     FRACTURE SURGERY     HAND SURGERY      MEDICATIONS: Current Outpatient Medications on File Prior to Visit  Medication Sig Dispense Refill   escitalopram (LEXAPRO) 10 MG tablet Take 10 mg by mouth at bedtime.     hydrOXYzine (ATARAX) 25 MG tablet Take 1 tablet (25 mg total) by mouth every 8 (eight) hours as needed for anxiety. 12 tablet 0   ibuprofen (ADVIL) 600 MG tablet Take 1 tablet (600 mg total) by mouth every 8 (eight) hours as needed (pain). 15 tablet 0   levonorgestrel (LILETTA, 52 MG,) 20.1 MCG/DAY IUD 1 each by Intrauterine route once.     LORazepam (ATIVAN) 0.5 MG tablet Take 1 tablet (0.5 mg total) by mouth every 8 (eight) hours as needed for anxiety. 12 tablet 0   mirtazapine (REMERON) 15 MG tablet Take 15 mg by mouth at bedtime.     omeprazole (PRILOSEC) 20 MG capsule Take 1 capsule (20 mg total) by mouth daily. 30 capsule 0   SUMAtriptan (IMITREX) 100 MG tablet Take 1 tablet (100 mg total) by mouth every 2 (two) hours as needed for migraine (Do not take more than 2 tablets in 24 hours). May repeat in 2 hours if headache persists or recurs. 10 tablet 0   No current facility-administered medications on file prior to visit.    ALLERGIES: No  Known Allergies  FAMILY HISTORY: Family History  Problem Relation Age of Onset   Hepatitis B Father     Objective:  *** General: No acute distress.  Patient appears well-groomed.   Head:  Normocephalic/atraumatic Eyes:  fundi examined but not visualized Neck: supple, no paraspinal tenderness, full range of motion Back: No  paraspinal tenderness Heart: regular rate and rhythm Lungs: Clear to auscultation bilaterally. Vascular: No carotid bruits. Neurological Exam: Mental status: alert and oriented to person, place, and time, speech fluent and not dysarthric, language intact. Cranial nerves: CN I: not tested CN II: pupils equal, round and reactive to light, visual fields intact CN III, IV, VI:  full range of motion, no nystagmus, no ptosis CN V: facial sensation intact. CN VII: upper and lower face symmetric CN VIII: hearing intact CN IX, X: gag intact, uvula midline CN XI: sternocleidomastoid and trapezius muscles intact CN XII: tongue midline Bulk & Tone: normal, no fasciculations. Motor:  muscle strength 5/5 throughout Sensation:  Pinprick, temperature and vibratory sensation intact. Deep Tendon Reflexes:  2+ throughout,  toes downgoing.   Finger to nose testing:  Without dysmetria.   Heel to shin:  Without dysmetria.   Gait:  Normal station and stride.  Romberg negative.    Thank you for allowing me to take part in the care of this patient.  Shon Millet, DO  CC:  Levert Feinstein, MD  Maury Dus, MD

## 2022-10-02 ENCOUNTER — Ambulatory Visit: Payer: Medicaid Other | Admitting: Neurology

## 2022-10-02 ENCOUNTER — Encounter: Payer: Self-pay | Admitting: Neurology

## 2022-10-05 ENCOUNTER — Other Ambulatory Visit: Payer: Self-pay | Admitting: Family Medicine

## 2022-10-05 NOTE — Telephone Encounter (Signed)
Patient walked in to request refill of:  Name of Medication(s):  Suma Triptan 100 mg  Last date of OV:  09/20/22 Pharmacy:  [facility] Ph # [phone]  Will route refill request to Clinic RN.  Discussed with patient policy to call pharmacy for future refills.  Also, discussed refills may take up to 48 hours to approve or deny.  Vilinda Blanks

## 2022-10-10 DIAGNOSIS — Z419 Encounter for procedure for purposes other than remedying health state, unspecified: Secondary | ICD-10-CM | POA: Diagnosis not present

## 2022-10-11 ENCOUNTER — Ambulatory Visit: Payer: Medicaid Other | Admitting: Student

## 2022-10-12 ENCOUNTER — Encounter: Payer: Self-pay | Admitting: Student

## 2022-10-12 ENCOUNTER — Ambulatory Visit (INDEPENDENT_AMBULATORY_CARE_PROVIDER_SITE_OTHER): Payer: Medicaid Other | Admitting: Student

## 2022-10-12 VITALS — BP 101/61 | HR 71 | Ht 62.0 in | Wt 174.2 lb

## 2022-10-12 DIAGNOSIS — G44209 Tension-type headache, unspecified, not intractable: Secondary | ICD-10-CM

## 2022-10-12 MED ORDER — SUMATRIPTAN SUCCINATE 100 MG PO TABS: 100.0000 mg | ORAL_TABLET | ORAL | 0 refills | Status: DC | PRN

## 2022-10-12 NOTE — Patient Instructions (Signed)
It was great to see you! Thank you for allowing me to participate in your care!   Our plans for today:  -I am refilling your sumatriptan-please do not take any more than 2 tablets in 24 hours -We should see you again after your teeth are pulled to see if your headache is getting better  Take care and seek immediate care sooner if you develop any concerns.  Levin Erp, MD

## 2022-10-12 NOTE — Assessment & Plan Note (Signed)
>>  ASSESSMENT AND PLAN FOR TENSION HEADACHE WRITTEN ON 10/12/2022  2:49 PM BY JAGADISH, MAYURI, MD  Does not appear to be migrainous in nature.  However she does have significant relief with sumatriptan .  Will plan to send this in for her.  I discussed that she should not take more than 2 of these pills in a 24-hour period discussed the risk of stroke, heart attack etc. with doses higher than that.  I discussed that after she gets her teeth extracted which I believe is the main cause of her headaches currently she should return to clinic to see how her headaches have been doing. -Sumatriptan  refill provided, education provided on the medication -Follow-up after teeth extraction 5/21

## 2022-10-12 NOTE — Assessment & Plan Note (Signed)
Does not appear to be migrainous in nature.  However she does have significant relief with sumatriptan.  Will plan to send this in for her.  I discussed that she should not take more than 2 of these pills in a 24-hour period discussed the risk of stroke, heart attack etc. with doses higher than that.  I discussed that after she gets her teeth extracted which I believe is the main cause of her headaches currently she should return to clinic to see how her headaches have been doing. -Sumatriptan refill provided, education provided on the medication -Follow-up after teeth extraction 5/21

## 2022-10-12 NOTE — Progress Notes (Signed)
    SUBJECTIVE:   CHIEF COMPLAINT / HPI: Headache follow-up In person interpreter present during entire encounter  Seen for a headache on 4/11.  Also was seen in the ED on 4/10.  She was prescribed triptan medication from the ED.  Was also told to see dentist due to poor dentition.  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  OBJECTIVE:   BP 101/61   Pulse 71   Ht 5\' 2"  (1.575 m)   Wt 174 lb 3.2 oz (79 kg)   SpO2 99%   BMI 31.86 kg/m   General: Well appearing, NAD, awake, alert, responsive to questions Head: Normocephalic atraumatic CV: Regular rate and rhythm no murmurs rubs or gallops Respiratory: Clear to ausculation bilaterally, no wheezes rales or crackles, chest rises symmetrically,  no increased work of breathing Neuro: CN II: PERRL CN III, IV,VI: EOMI CV V: Normal sensation in V1, V2, V3 CVII: Symmetric smile and brow raise CN VIII: Normal hearing CN IX,X: Symmetric palate raise  CN XI: 5/5 shoulder shrug CN XII: Symmetric tongue protrusion  UE and LE strength 5/5 Normal sensation in UE and LE bilaterally  No ataxia with finger to nose  ASSESSMENT/PLAN:   Tension headache Does not appear to be migrainous in nature.  However she does have significant relief with sumatriptan.  Will plan to send this in for her.  I discussed that she should not take more than 2 of these pills in a 24-hour period discussed the risk of stroke, heart attack etc. with doses higher than that.  I discussed that after she gets her  teeth extracted which I believe is the main cause of her headaches currently she should return to clinic to see how her headaches have been doing. -Sumatriptan refill provided, education provided on the medication -Follow-up after teeth extraction 5/21   Levin Erp, MD [facility]

## 2022-10-12 NOTE — Assessment & Plan Note (Signed)
>>  ASSESSMENT AND PLAN FOR SEVERE FRONTAL HEADACHES WRITTEN ON 01/23/2024  1:06 PM BY Nainoa Woldt, MD   >>ASSESSMENT AND PLAN FOR TENSION HEADACHE WRITTEN ON 10/12/2022  2:49 PM BY JAGADISH, MAYURI, MD  Does not appear to be migrainous in nature.  However she does have significant relief with sumatriptan .  Will plan to send this in for her.  I discussed that she should not take more than 2 of these pills in a 24-hour period discussed the risk of stroke, heart attack etc. with doses higher than that.  I discussed that after she gets her teeth extracted which I believe is the main cause of her headaches currently she should return to clinic to see how her headaches have been doing. -Sumatriptan  refill provided, education provided on the medication -Follow-up after teeth extraction 5/21

## 2022-10-25 ENCOUNTER — Telehealth: Payer: Self-pay | Admitting: General Practice

## 2022-10-25 NOTE — Telephone Encounter (Signed)
Patient's husband called and left message on nurse voicemail line stating the prescription that was sent in to [facility] isn't covered by her insurance & they need something else.

## 2022-10-26 NOTE — Telephone Encounter (Signed)
Per chart review we have not prescribed a medication recently but another provider has. I called patient with [employer] [phone] and  her husband answered and said she is not with him. I explained they will need to call  her other doctor who prescribed medication. He voices understanding. Nancy Fetter

## 2022-11-10 DIAGNOSIS — Z419 Encounter for procedure for purposes other than remedying health state, unspecified: Secondary | ICD-10-CM | POA: Diagnosis not present

## 2022-12-10 DIAGNOSIS — Z419 Encounter for procedure for purposes other than remedying health state, unspecified: Secondary | ICD-10-CM | POA: Diagnosis not present

## 2023-01-10 ENCOUNTER — Ambulatory Visit (INDEPENDENT_AMBULATORY_CARE_PROVIDER_SITE_OTHER): Payer: Medicaid Other | Admitting: Student

## 2023-01-10 VITALS — BP 103/63 | HR 67 | Ht 62.0 in | Wt 182.0 lb

## 2023-01-10 DIAGNOSIS — L219 Seborrheic dermatitis, unspecified: Secondary | ICD-10-CM

## 2023-01-10 DIAGNOSIS — Z419 Encounter for procedure for purposes other than remedying health state, unspecified: Secondary | ICD-10-CM | POA: Diagnosis not present

## 2023-01-10 HISTORY — DX: Seborrheic dermatitis, unspecified: L21.9

## 2023-01-10 MED ORDER — SELENIUM SULFIDE 2.3 % EX SHAM: 1.0000 | MEDICATED_SHAMPOO | Freq: Every day | CUTANEOUS | 3 refills | Status: DC

## 2023-01-10 MED ORDER — SELENIUM SULFIDE 1 % EX LOTN: 1.0000 | TOPICAL_LOTION | Freq: Every day | CUTANEOUS | 3 refills | Status: DC

## 2023-01-10 NOTE — Assessment & Plan Note (Signed)
Flaking and scaling most consistent with seborrheic dermatitis.  No concern for tinea capitis or other fungal infection at this time. Plan to treat with selenium sulfide shampoo, daily Return if no improvement

## 2023-01-10 NOTE — Progress Notes (Signed)
    SUBJECTIVE:   CHIEF COMPLAINT / HPI:   Diana Hopkins is a 27 year old female here to discuss itching of the scalp for the past 6 months.  She uses regular brand shampoos.  She has been having hair loss over the past 3 years.  She has a lot of flaking of the scalp.  PERTINENT  PMH / PSH: Reviewed  OBJECTIVE:   BP 103/63   Pulse 67   Ht 5\' 2"  (1.575 m)   Wt 182 lb (82.6 kg)   SpO2 100%   BMI 33.29 kg/m   General: Pleasant well-appearing Respiratory: Breathing on room air Head: Scaling and flaking of skin of the scalp.  Diffuse thinning of hair, but no focal areas of hair loss.  No ulcers or lesions.   ASSESSMENT/PLAN:   Seborrheic dermatitis Flaking and scaling most consistent with seborrheic dermatitis.  No concern for tinea capitis or other fungal infection at this time. Plan to treat with selenium sulfide shampoo, daily Return if no improvement   For hair loss, could consider obtaining TSH at next visit.  She could have hormonal related telogen effluvium.  Consider alopecia as well.  Discussed minoxidil but no current care for hair loss.  Darral Dash, DO [facility]

## 2023-01-10 NOTE — Patient Instructions (Addendum)
It was great seeing you today.  As we discussed, -You have a condition called "seborrheic dermatitis" and we will treat you with a shampoo that will help with the itching and flaking.  I sent this to your pharmacy. -Your hair loss could be due to a condition called alopecia, and unfortunately there is no treatment to stop the hair loss.  You may try using an over-the-counter treatment called "minoxidil" that can help prevent further hair loss, can take up to 12 weeks to see effect   If you have any questions or concerns, please feel free to call the clinic.   Have a wonderful day,  Dr. Darral Dash [facility] [phone]

## 2023-01-18 ENCOUNTER — Telehealth: Payer: Self-pay | Admitting: Family Medicine

## 2023-01-18 NOTE — Telephone Encounter (Signed)
Patient walked in with husband who stated the prescription for the shampoo for itching cost 600.00 and they are unable to afford it.  Want to know if there is another shampoo that is less expensive.   Husband ph # [phone]

## 2023-01-21 NOTE — Telephone Encounter (Signed)
Spoke to patient's husband and informed him that she can use Selsun Blue OTC shampoo or a cheaper alternative.  Glennie Hawk, CMA

## 2023-01-24 ENCOUNTER — Telehealth: Payer: Self-pay | Admitting: Family Medicine

## 2023-01-24 NOTE — Telephone Encounter (Signed)
Patient walked in and stated the insurance will not cover the medication for itching on scalp. Patient # [phone]; husband # is [phone]

## 2023-02-10 DIAGNOSIS — Z419 Encounter for procedure for purposes other than remedying health state, unspecified: Secondary | ICD-10-CM | POA: Diagnosis not present

## 2023-02-21 DIAGNOSIS — Z23 Encounter for immunization: Secondary | ICD-10-CM | POA: Diagnosis not present

## 2023-03-12 DIAGNOSIS — Z419 Encounter for procedure for purposes other than remedying health state, unspecified: Secondary | ICD-10-CM | POA: Diagnosis not present

## 2023-03-14 DIAGNOSIS — Z0289 Encounter for other administrative examinations: Secondary | ICD-10-CM | POA: Diagnosis not present

## 2023-03-27 ENCOUNTER — Ambulatory Visit: Payer: Medicaid Other | Admitting: Student

## 2023-03-27 VITALS — BP 115/75 | HR 77 | Temp 98.1°F | Wt 185.2 lb

## 2023-03-27 DIAGNOSIS — L650 Telogen effluvium: Secondary | ICD-10-CM

## 2023-03-27 DIAGNOSIS — G4700 Insomnia, unspecified: Secondary | ICD-10-CM | POA: Diagnosis not present

## 2023-03-27 DIAGNOSIS — F410 Panic disorder [episodic paroxysmal anxiety] without agoraphobia: Secondary | ICD-10-CM | POA: Diagnosis not present

## 2023-03-27 DIAGNOSIS — F419 Anxiety disorder, unspecified: Secondary | ICD-10-CM | POA: Diagnosis not present

## 2023-03-27 DIAGNOSIS — F32A Depression, unspecified: Secondary | ICD-10-CM

## 2023-03-27 HISTORY — DX: Telogen effluvium: L65.0

## 2023-03-27 MED ORDER — VENLAFAXINE HCL ER 75 MG PO CP24: 75.0000 mg | ORAL_CAPSULE | Freq: Every day | ORAL | 0 refills | Status: DC

## 2023-03-27 NOTE — Patient Instructions (Addendum)
I am prescribing you a new medication for your anxiety and depression called venlafaxine or Effexor.  You should take this pill once a day either in the morning or evening depending on how it affects your sleep.  This medication is designed to be taken every day and it is covered by Medicaid.  ?? ???? ??? ?? ????? ???? ???? ?????? ? ??????? ??? ????? ????? ?? ???? ?????????? ?? ???????? ??? ?????.  ??? ???? ??? ??? ?? ?? ??? ?? ??? ?? ??? ?? ??? ???? ?? ????? ????? ???? ??? ?? ????? ?????? ???? ????.  ??? ????? ???? ??? ??? ??? ?? ?? ??? ???? ???? ? ???? ???? ??? ???? ???? ?????.  For your sleep I recommend you take over-the-counter melatonin.  This is a natural sleep aid that can help get your body ready for sleep.  I would not recommend taking more than 5 mg a day.  ???? ???? ??? ?? ????? ????? ?? ??? ???????? ???? ???? ???? ????.  ??? ?? ??? ????? ???? ???? ??? ?? ??????? ?? ??? ??? ???? ???? ????? ???.  ?? ???? ????? ?? 5 ???? ??? ?? ??? ?? ????? ??????.    Your hair loss is due to severe stress.  Once we get your anxiety and depression better controlled I suspect that your hair will improve.  For your itchy scalp you can use head and shoulders over-the-counter shampoo and conditioner.  ???? ??? ??? ?? ??? ????? ???? ???.  ??????? ?? ?????? ? ??????? ??? ?? ???? ?????? ???? ?? ???? ????? ?? ????? ??? ????? ????? ????.  ???? ???? ?? ???? ??? ??? ???????? ?? ????? ? ??? ????? ???? ???? ?? ? ???? ??????? ????.

## 2023-03-27 NOTE — Assessment & Plan Note (Signed)
Since mirtazapine was not assessable will instruct patient to trial melatonin up to 5 mg at night as needed for sleep.  Patient in agreement with plan.

## 2023-03-27 NOTE — Assessment & Plan Note (Signed)
Most likely hair loss is related to stress and anxiety.  For her itchy scalp recommended over-the-counter head and shoulders shampoo and conditioner.  Follow-up on hair loss at next visit.

## 2023-03-27 NOTE — Assessment & Plan Note (Signed)
Will start venlafaxine ER at 75 mg daily.  2-week follow-up appointment scheduled with PCP for titration of medication.  Instructed patient that she can take new SNRI either in the morning or evening depending on how it affects her sleep.

## 2023-03-27 NOTE — Progress Notes (Signed)
    SUBJECTIVE:   CHIEF COMPLAINT / HPI:   Diana Hopkins is a 27 y.o. female  presenting for severe anxiety and depression with hair loss.  In person interpreter helping to provide HPI.  She reports she has not taken any of her medications for anxiety and depression over 6 months due to Medicaid not covering her prescription.  Since stopping medication she reports she has had debilitating depression and anxiety.  It is affected her marriage and her relationship with her children.  She reports having intermittent bouts of insomnia and her hair falling out in clumps.  She reports being overwhelmed sometimes that she feels she would be better off dead however she does not have a current plan of how she would hurt herself or others and she reports she loves her children too much to hurt herself.  She reports that the Lexapro she was taking prior worsened her insomnia.  She was prescribed mirtazapine which did not help her with sleep.  She is wanting to try different medications to help with her symptoms.  PERTINENT  PMH / PSH: Reviewed and updated   OBJECTIVE:   BP 115/75   Pulse 77   Temp 98.1 F (36.7 C)   Wt 185 lb 3.2 oz (84 kg)   SpO2 97%   BMI 33.87 kg/m   Anxious-appearing, no acute distress Cardio: Regular rate, regular rhythm, no murmurs on exam. Pulm: Clear, no wheezing, no crackles. No increased work of breathing Abdominal: bowel sounds present, soft, non-tender, non-distended Extremities: no peripheral edema  Neuro: alert and oriented x3, speech normal in content, no facial asymmetry, strength intact and equal bilaterally in UE and LE, pupils equal and reactive to light.  Psych:  Cognition and judgment appear intact. Alert, communicative  and cooperative with normal attention span and concentration. No apparent delusions, illusions, hallucinations      03/27/2023    9:18 AM 01/10/2023   11:00 AM 10/12/2022    2:15 PM  PHQ9 SCORE ONLY  PHQ-9 Total Score 20 3 11        ASSESSMENT/PLAN:   Anxiety and depression Will start venlafaxine ER at 75 mg daily.  2-week follow-up appointment scheduled with PCP for titration of medication.  Instructed patient that she can take new SNRI either in the morning or evening depending on how it affects her sleep.  Insomnia Since mirtazapine was not assessable will instruct patient to trial melatonin up to 5 mg at night as needed for sleep.  Patient in agreement with plan.  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  For her itchy scalp recommended over-the-counter head and shoulders shampoo and conditioner.  Follow-up on hair loss at next visit.     Glendale Chard, DO [facility]

## 2023-04-12 ENCOUNTER — Ambulatory Visit: Payer: Self-pay | Admitting: Family Medicine

## 2023-04-12 DIAGNOSIS — Z419 Encounter for procedure for purposes other than remedying health state, unspecified: Secondary | ICD-10-CM | POA: Diagnosis not present

## 2023-05-12 DIAGNOSIS — Z419 Encounter for procedure for purposes other than remedying health state, unspecified: Secondary | ICD-10-CM | POA: Diagnosis not present

## 2023-05-24 ENCOUNTER — Encounter: Payer: Self-pay | Admitting: Student

## 2023-05-24 ENCOUNTER — Ambulatory Visit (INDEPENDENT_AMBULATORY_CARE_PROVIDER_SITE_OTHER): Payer: Medicaid Other | Admitting: Student

## 2023-05-24 VITALS — BP 118/72 | HR 91 | Ht 62.0 in | Wt 188.0 lb

## 2023-05-24 DIAGNOSIS — J069 Acute upper respiratory infection, unspecified: Secondary | ICD-10-CM

## 2023-05-24 MED ORDER — GUAIFENESIN ER 600 MG PO TB12: 600.0000 mg | ORAL_TABLET | Freq: Two times a day (BID) | ORAL | 0 refills | Status: AC

## 2023-05-24 NOTE — Patient Instructions (Addendum)
??????? ?? ????? ?? ??? ?????? ?????.  ????? ????? ?? ??? ???? ?? ??? ????? ?????? ????? ?????.  ?? ?? ???? ???? ??????? ??????? ?? ?? ??? ???? ?????? ?? ???? ???? ? ???? ??? ?????? ???.  ?????? ?? ????? ????? ?? ?? ???/??? ?? ??? ? ???? ???? ???? ?? ?? ??? ??? ??? ???.  ?????? ??????? ??????? ?? ???? ??? ????? ???.    Pleasure to meet you today.  Suspect you could have a viral respiratory illness.  I have sent in prescription for Mucinex which you will take daily help with the cough and phlegm.  Also I recommend doing warm water/tea with honey and lemon to help with the throat pain.  Can also do Tylenol for pain.

## 2023-05-24 NOTE — Progress Notes (Signed)
    SUBJECTIVE:   CHIEF COMPLAINT / HPI:   27 year old female with past medical history of anxiety and depression Presenting today with cough Symptoms started about a week ago Cough is productive of clear and yellow sputum No fevers, chills, runny nose or chest pain Associated symptom includes nasal congestion and sore throat. Known sick contacts in daughter who has ongoing cough  PERTINENT  PMH / PSH: Reviewed  OBJECTIVE:   BP 118/72   Pulse 91   Ht 5\' 2"  (1.575 m)   Wt 188 lb (85.3 kg)   SpO2 99%   BMI 34.39 kg/m    Physical Exam General: Alert, well appearing, NAD HEENT: Oropharyngeal erythema, no tonsillar exudate or lymphadenopathy Cardiovascular: RRR, No Murmurs, Normal S2/S2 Respiratory: CTAB, No wheezing or Rales Abdomen: No distension or tenderness Extremities: No edema on extremities    ASSESSMENT/PLAN:    Viral illness 27 y.o. year old  with fever, cough, congestion and sore throat. She is afebrile today and on exam has oropharyngeal erythema,  good work of breathing on RA and clear breath sounds bilaterally. Overall presentation and exam is consistent with viral URI.   - Discussed conservative management with warm water, honey and lemon. -Rx Mucinex - Recommended tylenol or ibuprofen for fever.  - Encouraged adequate hydration for patient.  - Outline signs and symptoms that will warrant ED visit or return for further assessment.     Jerre Simon, MD [facility]

## 2023-06-12 DIAGNOSIS — Z419 Encounter for procedure for purposes other than remedying health state, unspecified: Secondary | ICD-10-CM | POA: Diagnosis not present

## 2023-06-14 ENCOUNTER — Other Ambulatory Visit (HOSPITAL_COMMUNITY)
Admission: RE | Admit: 2023-06-14 | Discharge: 2023-06-14 | Disposition: A | Discharge status: Home / Self Care | Payer: Medicaid Other | Source: Ambulatory Visit | Attending: Family Medicine | Admitting: Family Medicine

## 2023-06-14 ENCOUNTER — Ambulatory Visit: Payer: Medicaid Other | Admitting: Family Medicine

## 2023-06-14 VITALS — BP 92/62 | HR 87 | Ht 62.0 in | Wt 168.0 lb

## 2023-06-14 DIAGNOSIS — N898 Other specified noninflammatory disorders of vagina: Secondary | ICD-10-CM | POA: Insufficient documentation

## 2023-06-14 DIAGNOSIS — N912 Amenorrhea, unspecified: Secondary | ICD-10-CM

## 2023-06-14 DIAGNOSIS — R300 Dysuria: Secondary | ICD-10-CM

## 2023-06-14 LAB — POCT URINALYSIS DIP (MANUAL ENTRY)
Bilirubin, UA: NEGATIVE
Glucose, UA: NEGATIVE mg/dL
Ketones, POC UA: NEGATIVE mg/dL
Nitrite, UA: NEGATIVE
Protein Ur, POC: NEGATIVE mg/dL
Spec Grav, UA: >=1.03 — AB (ref 1.010–1.025)
Urobilinogen, UA: 0.2 U/dL
pH, UA: 5.5 (ref 5.0–8.0)

## 2023-06-14 LAB — POCT URINE PREGNANCY: Preg Test, Ur: NEGATIVE

## 2023-06-14 NOTE — Patient Instructions (Addendum)
???? ??? ????? ???? ???! ???? ?? ????? ?? ??????? ??? ??? ?? ?????? ?????.   ????? ???? ????? ??? ??? ?? ?? ?? ?????? ?? ??? ???????.   ????? ?? ????:    1. ?? ??? ?? ???? ????? ??? ??????? ? ????? ??? ??????? ?????? ?????. ?? ????? ?? ?????? ????? ??? ? ??? ??? ???? ????? ?? ????? ???? ????? ?? ??? ???? ????? ????.   2. ?? ?????? ?? ??? ????? ???? ????? ?????? ???????? ?? ?? ??? ???? ????? ??? ?? IUD ????? ??? ???? ???? ??? ?? ??? ????? ?????.  ?? ??? ????? ?? ??? ?? ??? ????? ?????? ???? ?? ???? ????? ??? ????? ?? ???? ?? ?? ??? ?? ???? ?? ?????? ?????? ????? ????. 3. ??? ???? ??? ?? ??????? ??????? ??????? ????? ?? ???? ????? ?? ????? ????? ?? ?? ?? ???? ???? ?? 2 ????? ?? ??????? ????? ??? ????? ???? ?????? ????.  ????? ???? ????? ??????? ?? ???? ???? ?????? ????  ??? ??? ?? ???? ????? ???? ???? ???? ?? ??? ??? ???? ?? ?? ????? ????????? ??? ?????? ???? ????? ?????? ? ?? ????? ?????? ??? ????? ??? ?????? ????? ?????.   ?? ????? ???? ?? ??????????? ?? ?? ??????. ??? ???? ??????? ?????? ?? ?? ??? ???? ????? ????. ??? ???? ???? ?????? ?? ?? ???? MyChart (??? ???? ????) ?? ?? ???? ?? ????? ???? ??? ????? ????? ???. ??? ??? ?? ???? ????????? ??? ??? ?? 2 ???? ????? ???? ?????? ????? ?? ???? ???? ??????.  ??? ????? ??? ???? ??? ?? ?????? ????? ?? ?????? ?? ????? [phone] ???? ??????.  ????? ??? ?? ??? ?????? ????? ???? ?? ?????? ?? ?? ??? ??? ????? ????.   ??? ???????? DO ????? ???????  It was wonderful to see you today! Thank you for choosing [facility].   Please bring ALL of your medications with you to every visit.   Today we talked about:  We swabbed you for vaginal infections and sexually transmitted infections. I will follow up with the results and call you if you need any medication for treatment.   We are also doing a pregnancy test although given you have an IUD it is very unlikely you are pregnant.  I do think the bloating you are describing could be related  to other urinary or vaginal infections that we are testing you for. It is possible shaving could make the irritation worse, I would recommend avoiding it for the next week or 2 until your symptoms improve.  Please follow up as needed for persistent symptoms  If you haven't already, sign up for My Chart to have easy access to your labs results, and communication with your primary care physician.   We are checking some labs today. If they are abnormal, I will call you. If they are normal, I will send you a MyChart message (if it is active) or a letter in the mail. If you do not hear about your labs in the next 2 weeks, please call the office.  Call the clinic at [phone] if your symptoms worsen or you have any concerns.  Please be sure to schedule follow up at the front desk before you leave today.   Izetta Nap, DO Family Medicine

## 2023-06-14 NOTE — Assessment & Plan Note (Signed)
 Possibly UTI or related to vaginal itching as below.  UA with small hemoglobin and leukocytes, will send for culture and treat if appropriate. -Follow-up urine culture

## 2023-06-14 NOTE — Progress Notes (Signed)
    SUBJECTIVE:   CHIEF COMPLAINT / HPI:   Vaginal itching, dysuria Vaginal itching and burning with urination x 2 weeks.  Denies vaginal discharge.  Reports vaginal washing x 5 times per day without improvement.  Last menstrual cycle 3 months ago, has Liletta  IUD.  Denies any new sexual partners, married to her husband.  Would like testing for STI. H/o BV in 08/2022.  PERTINENT  PMH / PSH: MDD, GAD  OBJECTIVE:   BP 92/62   Pulse 87   Ht 5' 2 (1.575 m)   Wt 168 lb (76.2 kg)   SpO2 96%   BMI 30.73 kg/m    General: NAD, pleasant, able to participate in exam Cardiac: RRR, no murmurs. Respiratory: CTAB, normal effort, No wheezes, rales or rhonchi Abdomen: Bowel sounds present, nontender, nondistended Extremities: no edema or cyanosis. Skin: warm and dry, no rashes noted Neuro: alert, no obvious focal deficits Psych: Normal affect and mood Pelvic exam: normal external genitalia, vulva, vagina, cervix, uterus and adnexa, VAGINA: normal appearing vagina with normal color and discharge, no lesions, vaginal discharge - white and creamy, exam chaperoned by Verla, CMA.   ASSESSMENT/PLAN:   Assessment & Plan Dysuria Possibly UTI or related to vaginal itching as below.  UA with small hemoglobin and leukocytes, will send for culture and treat if appropriate. -Follow-up urine culture Vaginal itching Scant white discharge upon exam and no external irritation noted.  Given h/o BV, possible recurrence.  Patient in agreement with STI testing although low risk given 1 female partner. -G/C, trichomoniasis, BV and yeast swab Amenorrhea Liletta  IUD in place, discussed pregnancy is unlikely and amenorrhea is common with IUD.  Patient still requesting pregnancy test, negative in office.   Dr. Izetta Nap, DO [place] [facility]

## 2023-06-17 ENCOUNTER — Telehealth: Payer: Self-pay | Admitting: Family Medicine

## 2023-06-17 LAB — CERVICOVAGINAL ANCILLARY ONLY
Bacterial Vaginitis (gardnerella): NEGATIVE
Candida Glabrata: NEGATIVE
Candida Vaginitis: NEGATIVE
Chlamydia: NEGATIVE
Comment: NEGATIVE
Comment: NEGATIVE
Comment: NEGATIVE
Comment: NEGATIVE
Comment: NEGATIVE
Comment: NORMAL
Neisseria Gonorrhea: NEGATIVE
Trichomonas: NEGATIVE

## 2023-06-17 NOTE — Telephone Encounter (Signed)
 Called patient and preferred number discussed during appointment.  No answer, left HIPAA compliant voicemail.  All lab work negative at this time, urine culture still pending.  Will follow-up if urine culture positive to discuss treatment options.  Izetta Nap, DO

## 2023-06-18 ENCOUNTER — Telehealth: Payer: Self-pay | Admitting: Family Medicine

## 2023-06-18 DIAGNOSIS — N3001 Acute cystitis with hematuria: Secondary | ICD-10-CM

## 2023-06-18 LAB — URINE CULTURE

## 2023-06-18 MED ORDER — CEFADROXIL 500 MG PO CAPS: 500.0000 mg | ORAL_CAPSULE | Freq: Two times a day (BID) | ORAL | 0 refills | Status: AC

## 2023-06-18 NOTE — Telephone Encounter (Signed)
 Utilized phone interpretor regarding lab results. UC positive for E coli with greater than 100,000 CFU consistent with UTI. Sent in Cefadroxil  500mg  BID x 5 days to preferred pharmacy. Patient voiced understanding of treatment. Communicated vaginal swab results for STI, BV and yeast were all negative.  Izetta Nap, DO

## 2023-07-02 DIAGNOSIS — Z975 Presence of (intrauterine) contraceptive device: Secondary | ICD-10-CM | POA: Insufficient documentation

## 2023-07-03 ENCOUNTER — Ambulatory Visit: Payer: Medicaid Other | Admitting: Obstetrics & Gynecology

## 2023-07-03 ENCOUNTER — Encounter: Payer: Self-pay | Admitting: Obstetrics & Gynecology

## 2023-07-03 ENCOUNTER — Telehealth: Payer: Self-pay | Admitting: Obstetrics & Gynecology

## 2023-07-03 DIAGNOSIS — Z975 Presence of (intrauterine) contraceptive device: Secondary | ICD-10-CM

## 2023-07-03 NOTE — Telephone Encounter (Signed)
     Faculty Practice OB/GYN Physician Phone Call Documentation  I had a phone conversation with Diana Hopkins with the help of a [name] about her visit scheduled at 1:35 pm for IUD replacement.  Had Liletta placed 10/2020. She reported no periods x 5 months, was worried something was wrong.  She was assured that this was a normal side effect of the IUD, asked about any other concerns, and she said she had none. She was given the opportunity to come in to discuss this further, but she wanted to cancel the appointment.  Penni Bombard can be in place until 2030 or be removed earlier if she has any concerns. She was told to call back for any concerns and she asked me to cancel her appointment.  [facility] front dest staff notified.     Jaynie Collins, MD, FACOG Obstetrician & Gynecologist, [facility] for [employer], [facility]

## 2023-07-13 DIAGNOSIS — Z419 Encounter for procedure for purposes other than remedying health state, unspecified: Secondary | ICD-10-CM | POA: Diagnosis not present

## 2023-08-10 DIAGNOSIS — Z419 Encounter for procedure for purposes other than remedying health state, unspecified: Secondary | ICD-10-CM | POA: Diagnosis not present

## 2023-09-21 DIAGNOSIS — Z419 Encounter for procedure for purposes other than remedying health state, unspecified: Secondary | ICD-10-CM | POA: Diagnosis not present

## 2023-10-21 DIAGNOSIS — Z419 Encounter for procedure for purposes other than remedying health state, unspecified: Secondary | ICD-10-CM | POA: Diagnosis not present

## 2023-11-03 DIAGNOSIS — X58XXXA Exposure to other specified factors, initial encounter: Secondary | ICD-10-CM | POA: Diagnosis not present

## 2023-11-03 DIAGNOSIS — S46812A Strain of other muscles, fascia and tendons at shoulder and upper arm level, left arm, initial encounter: Secondary | ICD-10-CM | POA: Diagnosis not present

## 2023-11-03 DIAGNOSIS — L659 Nonscarring hair loss, unspecified: Secondary | ICD-10-CM | POA: Diagnosis not present

## 2023-11-21 DIAGNOSIS — Z419 Encounter for procedure for purposes other than remedying health state, unspecified: Secondary | ICD-10-CM | POA: Diagnosis not present

## 2023-12-06 ENCOUNTER — Ambulatory Visit (HOSPITAL_COMMUNITY)
Admission: EM | Admit: 2023-12-06 | Discharge: 2023-12-06 | Disposition: A | Discharge status: Home / Self Care | Attending: Nurse Practitioner | Admitting: Nurse Practitioner

## 2023-12-06 ENCOUNTER — Encounter (HOSPITAL_COMMUNITY): Payer: Self-pay | Admitting: *Deleted

## 2023-12-06 DIAGNOSIS — L659 Nonscarring hair loss, unspecified: Secondary | ICD-10-CM | POA: Diagnosis not present

## 2023-12-06 DIAGNOSIS — R519 Headache, unspecified: Secondary | ICD-10-CM

## 2023-12-06 DIAGNOSIS — G4700 Insomnia, unspecified: Secondary | ICD-10-CM | POA: Diagnosis not present

## 2023-12-06 MED ORDER — BUTALBITAL-APAP-CAFFEINE 50-325-40 MG PO TABS: 1.0000 | ORAL_TABLET | Freq: Four times a day (QID) | ORAL | 0 refills | Status: AC | PRN

## 2023-12-06 MED ORDER — KETOROLAC TROMETHAMINE 60 MG/2ML IM SOLN
60.0000 mg | Freq: Once | INTRAMUSCULAR | Status: AC
  Administered 2023-12-06: 60 mg via INTRAMUSCULAR

## 2023-12-06 MED ORDER — HYDROXYZINE HCL 25 MG PO TABS: 25.0000 mg | ORAL_TABLET | Freq: Every evening | ORAL | 0 refills | Status: AC | PRN

## 2023-12-06 MED ORDER — KETOROLAC TROMETHAMINE 60 MG/2ML IM SOLN
INTRAMUSCULAR | Status: AC
  Filled 2023-12-06: qty 2

## 2023-12-06 MED ORDER — DEXAMETHASONE SODIUM PHOSPHATE 10 MG/ML IJ SOLN
10.0000 mg | Freq: Once | INTRAMUSCULAR | Status: AC
  Administered 2023-12-06: 10 mg via INTRAMUSCULAR

## 2023-12-06 MED ORDER — DEXAMETHASONE SODIUM PHOSPHATE 10 MG/ML IJ SOLN
INTRAMUSCULAR | Status: AC
  Filled 2023-12-06: qty 1

## 2023-12-06 NOTE — ED Provider Notes (Signed)
 [facility]    CSN: [id number] Arrival date & time: 12/06/23  1816      History   Chief Complaint Chief Complaint  Patient presents with   Headache    HPI Diana Hopkins is a 28 y.o. female.   Discussed the use of AI scribe software for clinical note transcription with the patient, who gave verbal consent to proceed.   Dari language interpreter used for this encounter - Aseel #[id number]  Diana Hopkins is a 28 y.o. female with complaints of headache, weakness, and hair loss. She reports a history of severe headaches about a year ago associated with dental pain, which have recently recurred but with less intensity. For the past week, she has experienced weakness, particularly in her legs, feeling as though they cannot support her weight and that she might faint. The patient describes her current headache as less severe than the previous year's episodes, which were intense enough to cause crying and screaming. Yesterday, she noticed pain in her forehead. She reports some photosensitivity, noting discomfort with bright phone screens. The patient has attempted to use leftover prescription medication from last year, but it was ineffective for the current headache. She denies nausea or vomiting associated with the headache. In addition to the headache and weakness, the patient reports significant hair loss since the onset of her headaches. The hair loss is generalized but particularly noticeable when showering. She has tried changing hair products without improvement. The patient expresses concern about her appearance due to the hair loss. The patient attributes her symptoms to stress and anxiety, mentioning that she misses her family in [place]. She also reports poor sleep patterns, which may be contributing to her headaches. The patient denies any speech problems. Regarding menstrual history, the patient reports irregular periods occurring every 2-3 months and lasting for one day. Her last  period was two weeks ago. She is not currently using birth control.  The following portions of the patient's history were reviewed and updated as appropriate: allergies, current medications, past family history, past medical history, past social history, past surgical history, and problem list.    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 Authorizing Provider  butalbital-acetaminophen -caffeine (FIORICET) 50-325-40 MG tablet Take 1 tablet by mouth every 6 (six) hours as needed for headache. 12/06/23 12/05/24 Yes Iola Lukes, FNP  hydrOXYzine  (ATARAX ) 25 MG tablet Take 1 tablet (25 mg total) by mouth at bedtime as needed (anxiety and/or sleep). 12/06/23  Yes Bartt Gonzaga, Lukes, FNP  levonorgestrel  (LILETTA , 52 MG,) 20.1 MCG/DAY IUD 1 each by Intrauterine route once.    [provider]    Family History Family History  Problem Relation Age of Onset   Hepatitis B  Father     Social History Social History   Tobacco Use   Smoking status: Never   Smokeless tobacco: Never  Vaping Use   Vaping status: Never Used  Substance Use Topics   Alcohol use: Never   Drug use: Never     Allergies   Patient has no known allergies.   Review of Systems Review of Systems  Constitutional:  Positive for fatigue.  HENT: Negative.    Eyes:  Negative for photophobia and visual disturbance.  Respiratory: Negative.    Gastrointestinal:  Negative for nausea and vomiting.  Genitourinary:  Negative for menstrual problem (LMP around 2 weeks agoo; hx irregular periods, occurs every 2-3 months).  Musculoskeletal:  Negative for neck pain and neck stiffness.  Skin:        Hair loss   Neurological:  Positive for weakness, light-headedness and headaches. Negative for syncope and speech difficulty.  All other systems reviewed and are negative.    Physical Exam Triage Vital Signs ED Triage Vitals  Encounter Vitals Group     BP --      Girls Systolic BP Percentile --      Girls Diastolic BP Percentile --      Boys Systolic BP Percentile --      Boys Diastolic BP Percentile --      Pulse Rate 12/06/23 1909 71     Resp 12/06/23 1909 16     Temp 12/06/23 1909 98.4 F (36.9 C)     Temp Source 12/06/23 1909 Oral     SpO2 12/06/23 1909 98 %     Weight --      Height --      Head Circumference --      Peak Flow --      Pain Score 12/06/23 1907 8     Pain Loc --      Pain Education --      Exclude from Growth Chart --    No data found.  Updated Vital Signs Pulse 71   Temp 98.4 F (36.9 C) (Oral)   Resp 16   SpO2 98%   Visual Acuity Right Eye Distance:   Left Eye Distance:   Bilateral Distance:    Right Eye Near:   Left Eye Near:    Bilateral Near:     Physical Exam Vitals reviewed.  Constitutional:      General: She is awake. She is not in acute distress.    Appearance: Normal appearance. She is well-developed. She is not ill-appearing,  toxic-appearing or diaphoretic.  HENT:     Head: Normocephalic.     Nose: Nose normal.     Mouth/Throat:     Mouth: Mucous membranes are moist.   Eyes:     General: Vision grossly intact.     Extraocular Movements: Extraocular movements intact.     Right eye: No nystagmus.     Left eye: No nystagmus.  Conjunctiva/sclera: Conjunctivae normal.   Neck:     Trachea: Trachea normal.     Meningeal: Brudzinski's sign and Kernig's sign absent.   Cardiovascular:     Rate and Rhythm: Normal rate.     Heart sounds: Normal heart sounds.  Pulmonary:     Effort: Pulmonary effort is normal.     Breath sounds: Normal breath sounds.  Abdominal:     Palpations: Abdomen is soft.   Musculoskeletal:        General: Normal range of motion.     Cervical back: Normal range of motion and neck supple. No rigidity or tenderness.   Skin:    General: Skin is warm and dry.   Neurological:     General: No focal deficit present.     Mental Status: She is alert and oriented to person, place, and time.     Cranial Nerves: No cranial nerve deficit.     Sensory: Sensation is intact. No sensory deficit.     Motor: Motor function is intact. No weakness.     Coordination: Coordination is intact.     Gait: Gait is intact.   Psychiatric:        Behavior: Behavior is cooperative.      UC Treatments / Results  Labs (all labs ordered are listed, but only abnormal results are displayed) Labs Reviewed - No data to display  EKG   Radiology No results found.  Procedures Procedures (including critical care time)  Medications Ordered in UC Medications  dexamethasone  (DECADRON ) injection 10 mg (10 mg Intramuscular Given 12/06/23 2021)  ketorolac  (TORADOL ) injection 60 mg (60 mg Intramuscular Given 12/06/23 2021)    Initial Impression / Assessment and Plan / UC Course  I have reviewed the triage vital signs and the nursing notes.  Pertinent labs & imaging results that were available during my  care of the patient were reviewed by me and considered in my medical decision making (see chart for details).     Patient presents with persistent headaches that began approximately one year ago, initially severe and associated with dental pain. Headaches have since become less intense but remain significant. Over the past week, the patient has also experienced generalized weakness, fatigue, dizziness, and forehead pain, along with light sensitivity to phone screens. Denies nausea or vomiting. Sumatriptan , previously effective, has not provided recent relief. Headaches appear to be exacerbated by poor sleep and emotional stress related to family separation and anxiety. Decadron  and Toradol  were administered in clinic for acute symptom relief, and Fioricet was prescribed for ongoing headache management. Patient was counseled on the importance of hydration, rest, and stress management through deep breathing exercises. Sleep hygiene was reviewed, including limiting screen time and avoiding caffeine or heavy meals before bed. Hydroxyzine  prescribed as needed at bedtime for anxiety or insomnia. Advised to seek emergency care if headaches worsen or new neurological symptoms develop.  Patient also reports diffuse hair loss, particularly noticeable during showering, which began around the same time as the headaches. No changes in hair care products reported. Patient attributes the hair loss to stress and expresses concern about appearance. 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 Advised to monitor and follow up with dermatology if hair loss persists or worsens.  Today's evaluation has revealed no signs of a dangerous process. Discussed diagnosis with patient and/or guardian. Patient and/or guardian aware of their diagnosis, possible red flag symptoms to watch out for and need for close follow up. Patient and/or guardian understands verbal and written  discharge instructions. Patient and/or  guardian comfortable with plan and disposition.  Patient and/or guardian has a clear mental status at this time, good insight into illness (after discussion and teaching) and has clear judgment to make decisions regarding their care  Documentation was completed with the aid of voice recognition software. Transcription may contain typographical errors.  Final Clinical Impressions(s) / UC Diagnoses   Final diagnoses:  Acute intractable headache, unspecified headache type  Insomnia, unspecified type  Alopecia     Discharge Instructions      ??? ????? ???? ????? ???????? ????? ? ????? ???? ????? ?????? ???? ??????? ? ?????? ?? ??? ???? ?????? ???? ??????. ?????? ????? ????? ?? ????? ??????????? ?? ????? ???? ????? ?????? ???? ????? ?????. ????? ??? ???? ??? ?????? ???? ?????? ? ????? ???? ???? ?? ??????? ???????? ????? ??? ?????.  ????? ?? ?????? ??????? ??????? ? ??????? ???? ????? ???? ?????? ?????. ????? ????????? ??? ???? ????? ????? ?? ???? ???? ????? ??. ????? ????? ????? ?? ?????? ? ??????? ???? ????? ?????. ?? ??????? ????? ???? ?? ???????? ???????? ???? ?????? ????? ??????? ????. ???? ????? ????? ?? ?????? ?????? ? ????? ??????? ????? ????? ????? ?? ??? ???? ??????? ???? ? ??? ???? ??????? ?? ???????? ? ???? ?? ??????? ?? ???? ????. ????? ??????????? ??? ?? ???? ???? ???? ???? ????? ??? ?? ?? ???? ?????? ? ????? ???? ??? ???.  ?????? ???? ??? ???? ?? ????? ????? ?? ?? ??? ????? ?????? ???? ? ???????? ?? ????? ????? ??????. ??? ??????? ?? ????? ?? ????? ???? ?? ?????? ???. ??? ????? ???? ??? ?? ?????? ????? ????? ????? ??? ?? ???? ????? ?? ???? ??? ???? ??? ???? ?? ????? ???? ????? ????.  ????? ??? ?? ??? ??? ????? ????? ? ?? ??????? ??????? ????? ??????? ?????? ???? ??????? ?? ???? ?? ?????? ??? ?? ???? ?????? ????? ??? ?????? ????. ?? ???? ???? ??????? ?? ????? ?????? ??????? ??????? ???????? ???? ?? ????? ???? ????? ?? ??? ????/??????? ?????? ????.  You were seen today for ongoing  headaches and recent symptoms of fatigue, weakness, dizziness, and light sensitivity. You also reported that sumatriptan , which was helpful in the past, has not been effective recently. Your symptoms may be worsened by poor sleep and increased stress related to family changes. You received Toradol  and Decadron  in clinic today for immediate relief. Fioricet was prescribed to use as needed for headache management. Make sure to drink plenty of water and get adequate rest. Use deep breathing exercises or calming techniques to help manage stress. Practice good sleep hygiene by avoiding caffeine and heavy meals close to bedtime and limiting screen time (TV and phone) in bed. Hydroxyzine  was prescribed as needed at night to help with anxiety and trouble sleeping. You also reported recent hair loss, which appears to be diffuse and likely stress-related. There are no signs of infection or scalp irritation. This may improve as stress is managed, but follow up with a dermatologist if hair loss continues or worsens. Monitor your symptoms and follow up with your primary care provider if your headaches persist, medications are not effective, or if hair loss becomes more severe. Go to the emergency department if you experience sudden or worsening headache, vision changes, confusion, weakness, or any new neurological symptoms.      ED Prescriptions     Medication Sig Dispense Auth. Provider   butalbital-acetaminophen -caffeine (FIORICET) 50-325-40 MG tablet Take 1 tablet by mouth every 6 (six) hours as needed for headache. 12 tablet [place], [place],  FNP   hydrOXYzine  (ATARAX ) 25 MG tablet Take 1 tablet (25 mg total) by mouth at bedtime as needed (anxiety and/or sleep). 12 tablet Iola Lukes, FNP      PDMP not reviewed this encounter.   Iola Lukes, [place] 12/06/23 2101

## 2023-12-06 NOTE — ED Triage Notes (Signed)
 Professional interpreter used for clinical intake, Aseel # [id number]  Pt states she has been having a headache X 1 week, she has been seen in the past for these headaches but states the meds that were given at that time did not help. She had some left over imitrex  100mg  which she took without relief from 10/2022.  She is worried about a brain tumor since she has hair loss since this headache started about a week ago.

## 2023-12-06 NOTE — Discharge Instructions (Addendum)
??? ????? ???? ????? ???????? ????? ? ????? ???? ????? ?????? ???? ??????? ? ?????? ?? ??? ???? ?????? ???? ??????. ?????? ????? ????? ?? ????? ??????????? ?? ????? ???? ????? ?????? ???? ????? ?????. ????? ??? ???? ??? ?????? ???? ?????? ? ????? ???? ???? ?? ??????? ???????? ????? ??? ?????.  ????? ?? ?????? ??????? ??????? ? ??????? ???? ????? ???? ?????? ?????. ????? ????????? ??? ???? ????? ????? ?? ???? ???? ????? ??. ????? ????? ????? ?? ?????? ? ??????? ???? ????? ?????. ?? ??????? ????? ???? ?? ???????? ???????? ???? ?????? ????? ??????? ????. ???? ????? ????? ?? ?????? ?????? ? ????? ??????? ????? ????? ????? ?? ??? ???? ??????? ???? ? ??? ???? ??????? ?? ???????? ? ???? ?? ??????? ?? ???? ????. ????? ??????????? ??? ?? ???? ???? ???? ???? ????? ??? ?? ?? ???? ?????? ? ????? ???? ??? ???.  ?????? ???? ??? ???? ?? ????? ????? ?? ?? ??? ????? ?????? ???? ? ???????? ?? ????? ????? ??????. ??? ??????? ?? ????? ?? ????? ???? ?? ?????? ???. ??? ????? ???? ??? ?? ?????? ????? ????? ????? ??? ?? ???? ????? ?? ???? ??? ???? ??? ???? ?? ????? ???? ????? ????.  ????? ??? ?? ??? ??? ????? ????? ? ?? ??????? ??????? ????? ??????? ?????? ???? ??????? ?? ???? ?? ?????? ??? ?? ???? ?????? ????? ??? ?????? ????. ?? ???? ???? ??????? ?? ????? ?????? ??????? ??????? ???????? ???? ?? ????? ???? ????? ?? ??? ????/??????? ?????? ????.    You were seen today for ongoing headaches and recent symptoms of fatigue, weakness, dizziness, and light sensitivity. You also reported that sumatriptan , which was helpful in the past, has not been effective recently. Your symptoms may be worsened by poor sleep and increased stress related to family changes. You received Toradol  and Decadron  in clinic today for immediate relief. Fioricet was prescribed to use as needed for headache management. Make sure to drink plenty of water and get adequate rest. Use deep breathing exercises or calming techniques to help manage stress. Practice good  sleep hygiene by avoiding caffeine and heavy meals close to bedtime and limiting screen time (TV and phone) in bed. Hydroxyzine  was prescribed as needed at night to help with anxiety and trouble sleeping. You also reported recent hair loss, which appears to be diffuse and likely stress-related. There are no signs of infection or scalp irritation. This may improve as stress is managed, but follow up with a dermatologist if hair loss continues or worsens. Monitor your symptoms and follow up with your primary care provider if your headaches persist, medications are not effective, or if hair loss becomes more severe. Go to the emergency department if you experience sudden or worsening headache, vision changes, confusion, weakness, or any new neurological symptoms.

## 2023-12-12 ENCOUNTER — Ambulatory Visit

## 2023-12-12 DIAGNOSIS — Z23 Encounter for immunization: Secondary | ICD-10-CM | POA: Diagnosis not present

## 2023-12-16 ENCOUNTER — Ambulatory Visit

## 2023-12-19 ENCOUNTER — Ambulatory Visit (INDEPENDENT_AMBULATORY_CARE_PROVIDER_SITE_OTHER): Admitting: Family Medicine

## 2023-12-19 VITALS — BP 98/51 | HR 77 | Ht 62.0 in | Wt 189.8 lb

## 2023-12-19 DIAGNOSIS — L659 Nonscarring hair loss, unspecified: Secondary | ICD-10-CM | POA: Diagnosis not present

## 2023-12-19 DIAGNOSIS — F419 Anxiety disorder, unspecified: Secondary | ICD-10-CM | POA: Diagnosis not present

## 2023-12-19 NOTE — Patient Instructions (Addendum)
 It was wonderful to see you today.  Please bring ALL of your medications with you to every visit.   Today we talked about:  Please use the rogaine foam on your hair daily Please make a follow up with your primary doctor to discuss your anxiety/depression  Thank you for choosing [facility].   Please call [phone] with any questions about today's appointment.  Please arrive at least 15 minutes prior to your scheduled appointments.   If you had blood work today, I will send you a MyChart message or a letter if results are normal. Otherwise, I will give you a call.   If you had a referral placed, they will call you to set up an appointment. Please give us  a call if you don't hear back in the next 2 weeks.   If you need additional refills before your next appointment, please call your pharmacy first.   You should follow up in our clinic in No follow-ups on file.  Gloriann Ogren, MD Family Medicine     ????? ????? ?????????? ?? ??? ??? ??? ????? MINOXIDIL (mi NOX i dill) ????? ???? ???? ?? ?? ????? ?????. ??? ?? ?????? ??? ?? ??? ???? ?? ??? ?????. ??? ????? ???? ???? ????? ???? ??????? ????? ??? ??? ???? ????? ?? ????? ????? ?????? ??? ??? ?? ??????? ??? ??????. ???(???) ???? ?????: ????? ??? ?? ???? ??? ???? ???? ?? ??? ?????? ??? ?? ?????? ???? ???? ?????? ?? ??? ??? ??? ?? ??? ????? ?? ????? ?? ???: ? ???? ??? ????? ?? ?? ??? ?? ??? ??? ????? ? ??? ????? ???????? ???? ?? ???? ????? ? ???? ??????? ?? ??? ??? ?? ? ???? ??????? ???? ?? ? ???? ?? ??? ???? ?????? ????? ?? ?????? ??? ? ?? ??? ????? ???????? ?? ??????? ?? ???????????? ???? ????? ???? ?????? ??? ??? ?? ???? ????????? ? ????? ?? ???? ???? ????? ??? ? ?????? ????? ???? ?? ??? ???? ??????? ???? ??? ????? ??? ???? ??????? ????? ??? ???? ?? ???. ?? ???? ??????. ?????????? ??? ?? ?? ????? ????? ??? ?? ????? ????. ??? ?? ??????? ?? ??? ???? ???? ?? ? ???? ?? ??? ????. ??? ????? ?? ????? ???? ????? ??? ???  ?? ?? ??? ??????. ????? ?? ???? ?? ???????? ??? ??? ??????? ?????. ?? ???? ??????? ??? ????? ?? ????? ?? ??? ?????? ??? ???? ????. ??? ????? ???? ??????? ?? ????? ????? ???? ???. ??? ??? ?? ??: ??? ??? ?????? ?? ??? ????? ?? ??? ?? ?? ???? ???? ??? ????? ?? ???? ?????? ?? ?? ???? ???? ???? ??????. ???: ??? ??? ??? ???? ?????. ??? ??? ?? ?? ?????? ???? ?????. ??? ?? ?? ??? ?? ?? ??? ???? ??? ??? ?? ??? ?? ?? ??? ?????? ?? ?? ????? ?? ?? ??????? ????. ??? ????? ?? ??? ??? ???? ??? ???? ??? ?? ?? ??? ??????? ????. ?? ??? ??? ?????? ?? ????? ??????? ?????. ?? ???? ???? ?? ??? ????? ????? ????? ????? ??????? ?????? ??????. ???? ?????? ?? ??? ?????? ??? ?? ??? ????? ????? ??? ???? ?? ??????? ?????. ??? ???? ???? ???? ??????? ???? ?? ????? ????. ?? ????? ????? ?????? ??? ??? ??? ?? ???? ?? ???? ????? ???? ??????? ????? ??? ???? ????? ?? ???? ??? ????? ?? ??? ??????? ?????? ?????. ?????? ?? ???? ?????? ?? ??? ??? ???? ??????? ????? ???????? ?? ???? ???? ????????? ??????? ??????. ???? ?? ????? ???? ?? ????? ??? ????? ????? ????. ?? ???? ??????? ??? ????? ???? ?? ?? ???? ????? ???? ??? ????? ???? ???? ???? ???? ??? ???? ????? ??? ??????? ????. ??? ????  2 ?? 4 ??? ??????? ???? ?? ?? ?? ????? ??? ?? ????? ??? ????? ?????? ?????? ????. ??? ??? ??? ?? ?? ??????? ??? ????? ???? ??? ??? ???? ?? ????? ????. ??????? ??? ??????? ???? ????? ????? ????? ?????? ??? ???? ??????? ?? ????? 3 ??? ???????. ??? ??? ??? ?? 4 ??? ??? ??? ???? ?? ?? ?????? ??????? ??????? ?? ??? ????? ?? ????? ???? ? ?? ??? ?????? ??? ???? ??????. ??? ????? ?? ?? ??? ??? ????? ???? ?? ???? ???? ???? ??? ??????. ??? ??? ??? ??? ?? ????? ??????? ?? ?? ??? ???? ??? ??????. ??? ?? ??????? ????? ??? ??? ??? ?? ??????. ??? ???? ?? ??? ????? ???? ??????? ??? ?? ????? ????? ????? ??????? ?????. ???? ?? ???? ???? ??? ?? ??????? ??? ????? ????? ??????? ?? ??? ?? ???? ?? ????. ?? ?? ????? ????? ?? ???? ?? ?????? ??? ????? ????? ???? ????? ????? ?? ??? ???? ??  ?? ????? ?? ??? ?????? ??? ????? ????: ? ??? ????? ??? ???????--?????? ????? ????? ????? ???? ????? ?? ??? ????? ?? ??? ? ????? ????? ????? ????? ??? ?? ????? ???? ??? ???? ????? ??? ????? ????? ?? ??????? ???? ?? ?????? ??? ????? (??? ????? ?? ????? ???? ?? ??? ?????? ??? ????? ????): ? ????? ?? ??? ?? ???? ?? ? ?????? ? ????? ?? ??? ?????? ? ??? ????????? ?? ??? ???? ???? ???? ????? ????? ??????? ?? ????? ????. ???? ????? ??? ?? ???? ????? ????? ?? ????? ??? ???? ??????. ??? ???????? ????? ????? ?? ?? FDA ?? [id number] ????? ????. ????? ??? ?? ??? ???? ??????? ???? ?? ????? ????? ? ??????? ????? ??? ??? ?????. ?? ???? ????? ???? ??? 20 ? 25 ???? ????? ???? (68 ? 77 ???? ????????) ????? ????. ???? ?? ??????? ???? ???? ?????? ?????. ?? ????? ??? ?? ???? ??? ??? ?????. ?? ????? ??????? ???? ?? ??? ?? ????? ????? ??? ????????.

## 2023-12-19 NOTE — Progress Notes (Signed)
    SUBJECTIVE:   CHIEF COMPLAINT / HPI:   Derm clinic visit for hair loss  Pt reports hair loss x 4 years For past 3-4 months has been worsening and causing significant anxiety/stress Notices a lot of hair loss while showering and also brushing hair   Denies palpitations Does endorse occasional fatigue and numbness sensation in hands and feet Reports chronic leg swelling since her pregnancy a few years ago Endorses significant anxiety recently due to green card application. Also experiencing stress related to her children. Reports most of her family is still overseas. Reports a few years ago was pulling at her hair but has not been doing this recently  Does endorse family history of early hair loss including her sister who was around 5 or 64 she started to lose hair  PERTINENT  PMH / PSH: anxiety, depression, headaches  OBJECTIVE:   BP (!) 98/51   Pulse 77   Ht 5' 2 (1.575 m)   Wt 189 lb 12.8 oz (86.1 kg)   LMP 11/25/2023 (Approximate)   SpO2 100%   BMI 34.71 kg/m   General: NAD, pleasant, able to participate in exam Respiratory: No respiratory distress  Scalp:   See image. Thinning of hair throughout scalp without significant patches or lesions noted. Small area of excoriation/erythema noted on posterior scalp. No significant scaling or flaking.    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 This is not covered by medicaid but provided GoodRx coupon Anxiety Patient has multiple personal and family/social stressors for which she would likely benefit from close follow up and treatment Recommend close PCP follow up in 1wk    Pt also reports additional concerns including chronic leg swelling, numbness and headaches which have previously been investigated; recommend PCP f/u for this  Payton Coward, MD [facility]

## 2023-12-20 ENCOUNTER — Ambulatory Visit: Payer: Self-pay | Admitting: Family Medicine

## 2023-12-20 LAB — COMPREHENSIVE METABOLIC PANEL WITH GFR
ALT: 21 IU/L (ref 0–32)
AST: 17 IU/L (ref 0–40)
Albumin: 4.1 g/dL (ref 4.0–5.0)
Alkaline Phosphatase: 91 IU/L (ref 44–121)
BUN/Creatinine Ratio: 15 (ref 9–23)
BUN: 9 mg/dL (ref 6–20)
Bilirubin Total: 0.2 mg/dL (ref 0.0–1.2)
CO2: 21 mmol/L (ref 20–29)
Calcium: 9.3 mg/dL (ref 8.7–10.2)
Chloride: 105 mmol/L (ref 96–106)
Creatinine, Ser: 0.6 mg/dL (ref 0.57–1.00)
Globulin, Total: 2.8 g/dL (ref 1.5–4.5)
Glucose: 87 mg/dL (ref 70–99)
Potassium: 4.1 mmol/L (ref 3.5–5.2)
Sodium: 141 mmol/L (ref 134–144)
Total Protein: 6.9 g/dL (ref 6.0–8.5)
eGFR: 125 mL/min/1.73 (ref >59)

## 2023-12-20 LAB — CBC WITH DIFFERENTIAL/PLATELET
Basophils Absolute: 0 x10E3/uL (ref 0.0–0.2)
Basos: 1 %
EOS (ABSOLUTE): 0.1 x10E3/uL (ref 0.0–0.4)
Eos: 1 %
Hematocrit: 41.3 % (ref 34.0–46.6)
Hemoglobin: 13.3 g/dL (ref 11.1–15.9)
Immature Grans (Abs): 0 x10E3/uL (ref 0.0–0.1)
Immature Granulocytes: 0 %
Lymphocytes Absolute: 1.6 x10E3/uL (ref 0.7–3.1)
Lymphs: 27 %
MCH: 28.2 pg (ref 26.6–33.0)
MCHC: 32.2 g/dL (ref 31.5–35.7)
MCV: 88 fL (ref 79–97)
Monocytes Absolute: 0.4 x10E3/uL (ref 0.1–0.9)
Monocytes: 7 %
Neutrophils Absolute: 3.7 x10E3/uL (ref 1.4–7.0)
Neutrophils: 64 %
Platelets: 196 x10E3/uL (ref 150–450)
RBC: 4.71 x10E6/uL (ref 3.77–5.28)
RDW: 12.4 % (ref 11.7–15.4)
WBC: 5.7 x10E3/uL (ref 3.4–10.8)

## 2023-12-20 LAB — TSH: TSH: 1.42 u[IU]/mL (ref 0.450–4.500)

## 2023-12-21 DIAGNOSIS — Z419 Encounter for procedure for purposes other than remedying health state, unspecified: Secondary | ICD-10-CM | POA: Diagnosis not present

## 2024-01-10 ENCOUNTER — Ambulatory Visit: Admitting: Family Medicine

## 2024-01-10 ENCOUNTER — Encounter: Payer: Self-pay | Admitting: Family Medicine

## 2024-01-10 VITALS — BP 119/78 | HR 82 | Wt 189.4 lb

## 2024-01-10 DIAGNOSIS — L658 Other specified nonscarring hair loss: Secondary | ICD-10-CM | POA: Diagnosis not present

## 2024-01-10 DIAGNOSIS — I890 Lymphedema, not elsewhere classified: Secondary | ICD-10-CM

## 2024-01-10 DIAGNOSIS — R5383 Other fatigue: Secondary | ICD-10-CM

## 2024-01-10 MED ORDER — MINOXIDIL 5 % EX FOAM: 1.0000 | Freq: Every day | CUTANEOUS | 0 refills | Status: AC

## 2024-01-10 NOTE — Assessment & Plan Note (Signed)
 Of the bilateral lower extremities.  Stemmer's sign for lipedema is questionably positive; however, she endorses no pain to the bilateral lower extremities (which also makes potential DVT less likely).  Lymphedema has been more likely for her.  Recommended compression stockings for now.  Follow-up as needed.

## 2024-01-10 NOTE — Progress Notes (Signed)
    SUBJECTIVE:   CHIEF COMPLAINT / HPI:   Swelling legs Been going on since her first pregnancy. After her third pregnancy, unlike the others, this has persisted. 2 weeks ago, they were worse. They have remained swollen, more so the R>L. They have tried to reduce the amount of salt in her diet. No pain. She notes that she has numbness of the feet when she sits on the floor for dinner.  Fatigue Been going on for 3 months. Feels like she is going to pass out sometimes due to the weakness. It has been hard for her to stand up from the floor. She has a lot of tiredness when she goes for a walk. She used to have a lot of energy before this. She has an IUD in place. She works at [employer] twice a week.  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 She requests a medication to try this.  OBJECTIVE:   BP 119/78   Pulse 82   Wt 189 lb 6.4 oz (85.9 kg)   LMP 11/25/2023 (Approximate)   SpO2 99%   BMI 34.64 kg/m   General: Alert and oriented, in NAD Skin: Warm, dry, and intact  HEENT: NCAT, EOM grossly normal, midline nasal septum Respiratory: Breathing and speaking comfortably on RA Extremities: Moves all extremities grossly equally; bilateral lower extremities with nonpitting edema with right worse than left Neurological: No gross focal deficit, ambulates without difficulty Psychiatric: Appropriate mood and affect        ASSESSMENT/PLAN:   Assessment & Plan Other fatigue Unknown etiology.  Reassuringly CBC, CMP, TSH were normal earlier in July.  Will collect vitamin D and vitamin B12 today.  No red flags on exam today.  We did discuss her exercise habits as a potential contributor. 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  Possible component of telogen effluvium given her extensive social stressors.  Recommended over-the-counter minoxidil foam as well as biotin to help.  Will need to take caution with thyroid   testing in the future while on biotin. Lymphedema Of the bilateral lower extremities.  Stemmer's sign for lipedema is questionably positive; however, she endorses no pain to the bilateral lower extremities (which also makes potential DVT less likely).  Lymphedema has been more likely for her.  Recommended compression stockings for now.  Follow-up as needed.   Stuart Redo, MD [facility]

## 2024-01-10 NOTE — Patient Instructions (Addendum)
 I sent a prescription for minoxidil. You can pick up biotin to also help.  We are testing for vitamin levels today for fatigue.  Wear compression stockings (also over the counter) to help with the leg swelling.  ?? ?? ???? ???? ?????????? ???????. ??? ???????? ?????? ?? ???? ??? ??? ?????? ????.  ?? ????? ???? ??????? ?? ???? ????? ?????? ??????.  ???? ??? ?? ???? ??? ????? ??? ????? (?????? ??? ???????) ??????.

## 2024-01-10 NOTE — Assessment & Plan Note (Signed)
 Hair loss centrally in the head concerning for female pattern hair loss.  Possible component of telogen effluvium given her extensive social stressors.  Recommended over-the-counter minoxidil foam as well as biotin to help.  Will need to take caution with thyroid  testing in the future while on biotin.

## 2024-01-10 NOTE — Assessment & Plan Note (Signed)
 Unknown etiology.  Reassuringly CBC, CMP, TSH were normal earlier in July.  Will collect vitamin D and vitamin B12 today.  No red flags on exam today.  We did discuss her exercise habits as a potential contributor.

## 2024-01-17 ENCOUNTER — Telehealth: Payer: Self-pay | Admitting: Family Medicine

## 2024-01-17 ENCOUNTER — Other Ambulatory Visit

## 2024-01-17 DIAGNOSIS — R5383 Other fatigue: Secondary | ICD-10-CM

## 2024-01-17 NOTE — Telephone Encounter (Signed)
 Patient came in stating that she went to pick up the minoxidil  that was prescribed during her last visit, and was told that medicaid would not cover it.

## 2024-01-18 ENCOUNTER — Ambulatory Visit: Payer: Self-pay | Admitting: Family Medicine

## 2024-01-18 LAB — VITAMIN B12: Vitamin B-12: 183 pg/mL — ABNORMAL LOW (ref 232–1245)

## 2024-01-18 LAB — VITAMIN D 25 HYDROXY (VIT D DEFICIENCY, FRACTURES): Vit D, 25-Hydroxy: 15.7 ng/mL — ABNORMAL LOW (ref 30.0–100.0)

## 2024-01-18 MED ORDER — VITAMIN B-12 1000 MCG PO TABS: 1000.0000 ug | ORAL_TABLET | Freq: Every day | ORAL | 0 refills | Status: AC

## 2024-01-18 MED ORDER — VITAMIN D3 20 MCG (800 UNIT) PO TABS: 1.0000 | ORAL_TABLET | Freq: Every day | ORAL | 0 refills | Status: AC

## 2024-01-18 NOTE — Progress Notes (Signed)
 Sent patient letter describing low vitamin B12 and vitamin D  levels that could be contributing to her fatigue. Will send in supplementation. Advised to follow up if not improving or otherwise in 2 months for level recheck.

## 2024-01-20 NOTE — Telephone Encounter (Signed)
 Spoke to patient using Interpreter Mimla [phone]. Informed her that the minoxidil  is over the counter and that she will have to pay for it out of pocket.  Patient states that the last time it was prescribed that she had to pay for it and it was 50 dollars. She can not afford this.  Patient would like to know if there is an alternative that is cheaper and will be covered by her insurance?  I explained to patient that the doctor has no way of knowing if her insurance will cover RX.  Cena JONELLE Pesa, CMA

## 2024-01-21 DIAGNOSIS — Z419 Encounter for procedure for purposes other than remedying health state, unspecified: Secondary | ICD-10-CM | POA: Diagnosis not present

## 2024-01-21 NOTE — Telephone Encounter (Signed)
 Called patient to discuss minoxidil . Unfortunately is not covered by insurance. She will come back in this Thursday morning to discuss evaluation of potential PCOS as a cause, though I feel this is less likely at this time. Her vitamin D  and vitamin B12 deficiencies may also be contributing; we discussed her need for supplementation and that it has been sent to her pharmacy. Patient understanding and appreciative.

## 2024-01-23 ENCOUNTER — Encounter: Payer: Self-pay | Admitting: Family Medicine

## 2024-01-23 ENCOUNTER — Ambulatory Visit (INDEPENDENT_AMBULATORY_CARE_PROVIDER_SITE_OTHER): Payer: Self-pay | Admitting: Family Medicine

## 2024-01-23 VITALS — BP 100/68 | HR 78 | Wt 188.2 lb

## 2024-01-23 DIAGNOSIS — I83893 Varicose veins of bilateral lower extremities with other complications: Secondary | ICD-10-CM

## 2024-01-23 DIAGNOSIS — L658 Other specified nonscarring hair loss: Secondary | ICD-10-CM

## 2024-01-23 DIAGNOSIS — T148XXA Other injury of unspecified body region, initial encounter: Secondary | ICD-10-CM

## 2024-01-23 MED ORDER — BIOTIN 5 MG PO TABS: 1.0000 | ORAL_TABLET | Freq: Every day | ORAL | 0 refills | Status: AC

## 2024-01-23 NOTE — Assessment & Plan Note (Signed)
 Waxing and waning problem.  Continue compression stockings and elevation.  Follow-up as needed.

## 2024-01-23 NOTE — Patient Instructions (Addendum)
 Be sure to take your vitamin D , vitamin B12, and biotin  every day to help with energy and hair. Elevate the legs and use compression for your legs for venous stasis.   ????? ???? ?? ??????? D? ??????? B12 ? ?????? ??? ?? ?? ??? ???? ???? ?? ?? ????? ? ?? ??? ???. ???? ?? ???? ???? ? ?? ????? ???? ???? ????? ??? ???? ???? ????? ??????? ????.

## 2024-01-23 NOTE — Progress Notes (Signed)
    SUBJECTIVE:   CHIEF COMPLAINT / HPI:   Hair loss Returns given unable to afford minoxidil . She does not have regular periods, but she is on an IUD. She does not have hirsutism. Previous lab testing normal other than low vitamin D  and vitamin B12. Other Afghan ladies are making fun of her for this.  Leg swelling, vitamin deficiencies, bruising Have not picked up her vitamins yet. She continues to have leg swelling. Sometimes she will also have bruising.  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  Recommended trying biotin  (if TSH is checked in the future, would recommend interpreting with caution if she remains on biotin ).  Also recommended supplementing vitamin D  and vitamin B12, as these can also contribute to hair loss.  PCOS was considered; however, she is unsure about her period timing given she has an IUD, and there is no hirsutism on exam today.  Follow-up as needed. Varicose veins of both legs with edema Waxing and waning problem.  Continue compression stockings and elevation.  Follow-up as needed. Bruising No significant bruises noted on my exam today.  Her CBC recently was reassuring.  Unlikely Cushing's disease or vitamin C deficiency given history and exam.  Follow-up as needed.   Stuart Redo, MD [facility]

## 2024-01-23 NOTE — Assessment & Plan Note (Addendum)
 Unfortunately unable to afford minoxidil .  Recommended trying biotin  (if TSH is checked in the future, would recommend interpreting with caution if she remains on biotin ).  Also recommended supplementing vitamin D  and vitamin B12, as these can also contribute to hair loss.  PCOS was considered; however, she is unsure about her period timing given she has an IUD, and there is no hirsutism on exam today.  Follow-up as needed.

## 2024-02-21 DIAGNOSIS — Z419 Encounter for procedure for purposes other than remedying health state, unspecified: Secondary | ICD-10-CM | POA: Diagnosis not present

## 2024-03-03 ENCOUNTER — Encounter (HOSPITAL_COMMUNITY): Payer: Self-pay

## 2024-03-03 ENCOUNTER — Ambulatory Visit (HOSPITAL_COMMUNITY)
Admission: EM | Admit: 2024-03-03 | Discharge: 2024-03-03 | Disposition: A | Discharge status: Home / Self Care | Attending: Internal Medicine | Admitting: Internal Medicine

## 2024-03-03 DIAGNOSIS — N3001 Acute cystitis with hematuria: Secondary | ICD-10-CM | POA: Diagnosis not present

## 2024-03-03 DIAGNOSIS — N760 Acute vaginitis: Secondary | ICD-10-CM | POA: Insufficient documentation

## 2024-03-03 DIAGNOSIS — Z87442 Personal history of urinary calculi: Secondary | ICD-10-CM | POA: Diagnosis not present

## 2024-03-03 DIAGNOSIS — M545 Low back pain, unspecified: Secondary | ICD-10-CM | POA: Insufficient documentation

## 2024-03-03 LAB — CBC
HCT: 38.6 % (ref 36.0–46.0)
Hemoglobin: 12.8 g/dL (ref 12.0–15.0)
MCH: 28 pg (ref 26.0–34.0)
MCHC: 33.2 g/dL (ref 30.0–36.0)
MCV: 84.5 fL (ref 80.0–100.0)
Platelets: 222 K/uL (ref 150–400)
RBC: 4.57 MIL/uL (ref 3.87–5.11)
RDW: 13.2 % (ref 11.5–15.5)
WBC: 7.6 K/uL (ref 4.0–10.5)
nRBC: 0 % (ref 0.0–0.2)

## 2024-03-03 LAB — POCT URINALYSIS DIP (MANUAL ENTRY)
Bilirubin, UA: NEGATIVE
Glucose, UA: NEGATIVE mg/dL
Ketones, POC UA: NEGATIVE mg/dL
Nitrite, UA: POSITIVE — AB
Protein Ur, POC: 100 mg/dL — AB
Spec Grav, UA: 1.02 (ref 1.010–1.025)
Urobilinogen, UA: 0.2 U/dL
pH, UA: 5.5 (ref 5.0–8.0)

## 2024-03-03 LAB — BASIC METABOLIC PANEL WITH GFR
Anion gap: 9 (ref 5–15)
BUN: 13 mg/dL (ref 6–20)
CO2: 20 mmol/L — ABNORMAL LOW (ref 22–32)
Calcium: 8.6 mg/dL — ABNORMAL LOW (ref 8.9–10.3)
Chloride: 109 mmol/L (ref 98–111)
Creatinine, Ser: 0.56 mg/dL (ref 0.44–1.00)
GFR, Estimated: >60 mL/min (ref >60)
Glucose, Bld: 83 mg/dL (ref 70–99)
Potassium: 3.7 mmol/L (ref 3.5–5.1)
Sodium: 138 mmol/L (ref 135–145)

## 2024-03-03 LAB — POCT URINE PREGNANCY: Preg Test, Ur: NEGATIVE

## 2024-03-03 MED ORDER — CEPHALEXIN 500 MG PO CAPS: 500.0000 mg | ORAL_CAPSULE | Freq: Two times a day (BID) | ORAL | 0 refills | Status: AC

## 2024-03-03 MED ORDER — TAMSULOSIN HCL 0.4 MG PO CAPS: 0.4000 mg | ORAL_CAPSULE | Freq: Every day | ORAL | 0 refills | Status: AC

## 2024-03-03 MED ORDER — KETOROLAC TROMETHAMINE 30 MG/ML IJ SOLN
30.0000 mg | Freq: Once | INTRAMUSCULAR | Status: AC
  Administered 2024-03-03: 30 mg via INTRAMUSCULAR

## 2024-03-03 MED ORDER — KETOROLAC TROMETHAMINE 30 MG/ML IJ SOLN
INTRAMUSCULAR | Status: AC
  Filled 2024-03-03: qty 1

## 2024-03-03 MED ORDER — FLUCONAZOLE 150 MG PO TABS: 150.0000 mg | ORAL_TABLET | ORAL | 0 refills | Status: AC

## 2024-03-03 NOTE — ED Triage Notes (Signed)
 Pt states via interpretor [id number] for the past week she has been having urinary retention and burning  and itching with urination and pain to the left side of her back and abdomen.

## 2024-03-03 NOTE — ED Provider Notes (Signed)
 [facility]    CSN: [id number] Arrival date & time: 03/03/24  1833      History   Chief Complaint Chief Complaint  Patient presents with   Back Pain    HPI Diana Hopkins is a 28 y.o. female.   Diana Hopkins is a 28 y.o. female presenting for chief complaint of left low back pain that radiates into the left lower quadrant abdomen. Pain started 1 week ago and has gradually worsened over the last 4-5 days. Reports dysuria, urinary frequency, urinary urgency, and incomplete bladder emptying sensation associated with low back pain starting 3-4 days ago.   No gross hematuria, nausea, vomiting, diarrhea, constipation, blood/mucous in the stools, dizziness, headache, or recent fever/chills.  Low back pain does not radiate to the legs, no recent injuries to the low back, denies saddle anesthesia symptoms.   History of renal stone 8 years ago, states pain feels similar. Renal stone was non-obstructive, she was able to pass it herself, and she has never needed lithotripsy.   Also complains of vaginal itching and discharge for the last 2-3 days. Vaginal discharge is thin, white, and with irritation, no vaginal odor or concern for STD. Denies dyspareunia, vaginal rash, and recent antibiotic/steroid use.   LMP 4 months ago, she has an IUD. Denies chance of pregnancy.   She has taken tylenol  at home for symptoms with minimal relief.   The history is provided by the patient. The history is limited by a language barrier. A language interpreter was used Dentist medical interpreter used for entirety of patient encounter.).  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 Authorizing Provider  cephALEXin  (KEFLEX ) 500 MG capsule Take 1 capsule (500 mg total) by mouth 2 (two) times daily for 7 days. 03/03/24 03/10/24 Yes Corian Handley, Dorna HERO, FNP  fluconazole  (DIFLUCAN ) 150 MG tablet Take 1 tablet (150 mg total) by mouth every 3 (three) days. 03/03/24  Yes Enedelia Dorna HERO, FNP  tamsulosin  (FLOMAX ) 0.4 MG CAPS capsule Take 1 capsule (0.4 mg total) by mouth daily. 03/03/24  Yes Enedelia Dorna HERO, FNP  Biotin   5 MG TABS Take 1 tablet (5 mg total) by mouth daily. 01/23/24   Tharon Lung, MD  butalbital -acetaminophen -caffeine  (FIORICET) 50-325-40 MG tablet Take 1 tablet by mouth every 6 (six) hours as needed for headache. 12/06/23 12/05/24  Iola Lukes, FNP  Cholecalciferol (VITAMIN D3) 20 MCG (800 UNIT) TABS Take 1 tablet by mouth daily. 01/18/24 04/17/24  Tharon Lung, MD  cyanocobalamin (VITAMIN B12) 1000 MCG tablet Take 1 tablet (1,000 mcg total) by mouth daily. 01/18/24   Tharon Lung, MD  hydrOXYzine  (ATARAX ) 25 MG tablet Take 1 tablet (25 mg total) by mouth at bedtime as needed (anxiety and/or sleep). Patient not taking: Reported on 01/10/2024 12/06/23   Iola Lukes, FNP  levonorgestrel  (LILETTA , 52 MG,) 20.1 MCG/DAY IUD 1 each by  Intrauterine route once.    [provider]  Minoxidil  5 % FOAM Apply 1 application  topically daily. 01/10/24   Tharon Lung, MD    Family History Family History  Problem Relation Age of Onset   Hepatitis B Father     Social History Social History   Tobacco Use   Smoking status: Never   Smokeless tobacco: Never  Vaping Use   Vaping status: Never Used  Substance Use Topics   Alcohol use: Never   Drug use: Never     Allergies   Patient has no known allergies.   Review of Systems Review of Systems  Musculoskeletal:  Positive for back pain.  Per HPI   Physical Exam Triage Vital Signs ED Triage Vitals  Encounter Vitals Group     BP 03/03/24 1954 107/72     Girls Systolic BP Percentile --      Girls Diastolic BP Percentile --      Boys Systolic BP Percentile --      Boys Diastolic BP Percentile --      Pulse Rate 03/03/24 1954 83     Resp 03/03/24 1954 16     Temp 03/03/24 1954 97.7 F (36.5 C)     Temp Source 03/03/24 1954 Oral     SpO2 03/03/24 1954 98 %     Weight --      Height --      Head Circumference --      Peak Flow --      Pain Score 03/03/24 1951 8     Pain Loc --      Pain Education --      Exclude from Growth Chart --    No data found.  Updated Vital Signs BP 107/72 (BP Location: Left Arm)   Pulse 83   Temp 97.7 F (36.5 C) (Oral)   Resp 16   SpO2 98%   Visual Acuity Right Eye Distance:   Left Eye Distance:   Bilateral Distance:    Right Eye Near:   Left Eye Near:    Bilateral Near:     Physical Exam Vitals and nursing note reviewed.  Constitutional:      Appearance: She is not ill-appearing or toxic-appearing.  HENT:     Head: Normocephalic and atraumatic.     Right Ear: Hearing and external ear normal.     Left Ear: Hearing and external ear normal.     Nose: Nose normal.     Mouth/Throat:     Lips: Pink.  Eyes:     General: Lids are normal. Vision grossly intact. Gaze aligned appropriately.     Extraocular  Movements: Extraocular movements intact.     Conjunctiva/sclera: Conjunctivae normal.  Cardiovascular:  Rate and Rhythm: Normal rate and regular rhythm.     Heart sounds: Normal heart sounds, S1 normal and S2 normal.  Pulmonary:     Effort: Pulmonary effort is normal. No respiratory distress.     Breath sounds: Normal breath sounds and air entry.  Abdominal:     General: Abdomen is flat. Bowel sounds are normal.     Palpations: Abdomen is soft.     Tenderness: There is abdominal tenderness in the suprapubic area and left lower quadrant. There is no right CVA tenderness, left CVA tenderness, guarding or rebound. Negative signs include Murphy's sign and McBurney's sign.     Hernia: No hernia is present.     Comments: No guarding on exam. Normal bowel sounds throughout. No peritoneal signs.   Musculoskeletal:     Cervical back: Normal and neck supple.     Thoracic back: Normal.     Lumbar back: Normal.       Back:  Skin:    General: Skin is warm and dry.     Capillary Refill: Capillary refill takes less than 2 seconds.     Findings: No rash.  Neurological:     General: No focal deficit present.     Mental Status: She is alert and oriented to person, place, and time. Mental status is at baseline.     Cranial Nerves: No dysarthria or facial asymmetry.  Psychiatric:        Mood and Affect: Mood normal.        Speech: Speech normal.        Behavior: Behavior normal.        Thought Content: Thought content normal.        Judgment: Judgment normal.      UC Treatments / Results  Labs (all labs ordered are listed, but only abnormal results are displayed)   EKG   Radiology No results found.  Procedures Procedures (including critical care time)  Medications Ordered in UC Medications  ketorolac  (TORADOL ) 30 MG/ML injection 30 mg (30 mg Intramuscular Given 03/03/24 2023)    Initial Impression / Assessment and Plan / UC Course  I have reviewed the triage vital signs and  the nursing notes.  Pertinent labs & imaging results that were available during my care of the patient were reviewed by me and considered in my medical decision making (see chart for details).   1. Acute left sided low back pain without sciatica, acute cystitis with hematuria, history of nephrolithiasis Presentation is consistent with low back pain and abdominal pain secondary to complicated UTI with high clinical suspicion for ureteral stone given history of nephrolithiasis with similar presentation.   - Urinalysis consistent with UTI and shows leukocytes, blood, nitrites. Urine culture is pending.  - Urine pregnancy test negative.   Treatment: - Ketorolac  30mg  IM given for acute pain. (CMP from 12/19/2023 shows normal renal function.) No NSAIDs for 24 hours.  - Tylenol  PRN for pain.  - Keflex  BID for 7 days to treat UTI.  - Tamsulosin  daily PRN for low back pain to allow for passage of suspected renal stone.  Basic labs pending to evaluate for signs of infection, electrolyte abnormality, renal function. Staff will call if abnormal.   No current signs of obstruction, voiding normal amounts, however ER precautions discussed.   Vaginal swab pending, empiric treatment for suspected yeast vaginitis ordered (diflucan ). Staff will treat per protocol for other positive infections on vaginal swab once resulted.   Counseled patient on potential for  adverse effects with medications prescribed/recommended today, strict ER and return-to-clinic precautions discussed, patient verbalized understanding.    Final Clinical Impressions(s) / UC Diagnoses   Final diagnoses:  Acute left-sided low back pain without sciatica  Acute cystitis with hematuria  History of nephrolithiasis     Discharge Instructions      ??? ?? ????? ????? ?????? ?????. ?? ?????? ???? ????? ?? ??? ?? ??? ???? ?????.  ???? ?????? Keflex  ?? ?? 12 ???? ???? 7 ??? ???? ????.  ?? ????? ?? ?????? ?? ??? ?? ?????? ?????????  ????? ?? ?? ??? ??? ??? ??? ???.  ??? ???????? ?? 6 ???? ??????? ?? ?????? ???? ??? ???? ??? ???? ????.  ?? ??????? ??? ???/??? ??? ?? ??? ??? ???? ????? ????? ???? ?????? 1 ??? ?????????? ???? ????. ?????????? ??????? ??? ?????? ???? ?? ????? ????? ???? ?? ??? ?? ???? ??????? ?? ??? ????? ??????.  ??? ????? ??? ???? ??? ?? ??? ??? ???? ?? ????? ???? ???? 12 ???? ??????? ????? ???? ? ??????? ????? ?? ??? ??? ??/???? ?? ???? ??? ??? ??? ?? ??? ?? ????? ????? ?? ???? ???? ?? ??? ????? ???? ??????? ?? ???? ???? ????? ???? ??? ?? ??? ???? ??? ?? ??? ????? ???? ???? ????? ? ???/??? ??? ?????? ??? ???.  ???? ????? ???? ?? 3 ?? 4 ??? ????? ?? ????? ????? ?????? ??? ????? ??? ?????? ????. shma yek ofont mojraye edrari darid. man npamchenan goman mikonam keh shma yek sang [place] darid.  enti biotik Keflex  ra npar 12 saat baraye 7 ruz masraf konid.  ma emruz dar kolinik bah shma yek vaksin kitorolak dadim ta dar kamar dard shma kamak kand.  shma mitvanid npar 6 saat taylenol ra torikeh baraye dard niaz est masraf konid.  ta zamanikeh dard kamar/shkam shma bah alat sang [place] mosh[place] bacpehbud yabad rozaneh 1 ghors tamsolosin [employer]. tamsolosin mitvand sabeb sargichi gardad az inro motmaen shvid keh shma ab zyad minoshid ta khob abdar bemanid.  eger alim shma badtar shod ya eger shma ghadar bah edrar kardan baraye 12 saat nabashid, besiar tahavo ve estefragh konid, ya eger shma tab/shodid ya badtar shodan dard shkam ya kamar ra ijad konid, man niaz daram keh shma foran baraye arzyabi bah otagh ajel barvid zira in bah in [place] est keh shma ofont shodid [place] darid ve sang/sang shma ensedad shodeh est.  baraye barresi mojdad dar 3 ta 4 ruz ayandeh ba araeh dahandeh maraghbat cpehei oliyeh khod peygiri konid.  You have a urinary tract infection. I also suspect that you have a kidney stone.  Take Keflex  antibiotic every 12 hours for 7 days.  We gave you a shot of ketorolac  in the clinic  today to help with your low back pain.  You may take Tylenol  every 6 hours as needed for pain.  Take tamsulosin  1 pill daily until your low back/abdominal pain improves due to suspected kidney stone. Tamsulosin  can cause some dizziness so make sure that you are drinking plenty of water to stay well-hydrated.  If your symptoms worsen or if you are not able to pass any urine for 12 hours, become very nauseous and throw up, or if you develop fever/severe or worsening abdominal or lower back pain, I need you to go to the emergency room immediately for evaluation as this could mean that you have a severe kidney infection and/or your stone has become obstructed.  Follow-up with your primary care provider in the next 3 to 4 days for recheck.  ED Prescriptions     Medication Sig Dispense Auth. Provider   tamsulosin  (FLOMAX ) 0.4 MG CAPS capsule Take 1 capsule (0.4 mg total) by mouth daily. 5 capsule Enedelia Dorna HERO, FNP   cephALEXin  (KEFLEX ) 500 MG capsule Take 1 capsule (500 mg total) by mouth 2 (two) times daily for 7 days. 14 capsule Enedelia Dorna M, FNP   fluconazole  (DIFLUCAN ) 150 MG tablet Take 1 tablet (150 mg total) by mouth every 3 (three) days. 2 tablet Enedelia Dorna HERO, FNP      PDMP not reviewed this encounter.   Enedelia Dorna HERO, [place] 03/07/24 2148

## 2024-03-03 NOTE — Discharge Instructions (Addendum)
??? ?? ????? ????? ?????? ?????. ?? ?????? ???? ????? ?? ??? ?? ??? ???? ?????.  ???? ??????   Keflex  ?? ?? 12 ???? ???? 7 ??? ???? ????.  ?? ????? ?? ?????? ?? ??? ?? ?????? ????????? ????? ?? ?? ??? ??? ??? ??? ???.  ??? ???????? ?? 6 ???? ??????? ?? ?????? ???? ??? ???? ??? ???? ????.  ?? ??????? ??? ???/??? ??? ?? ??? ??? ???? ????? ????? ???? ?????? 1 ??? ?????????? ???? ????. ?????????? ??????? ??? ?????? ???? ?? ????? ????? ???? ?? ??? ?? ???? ??????? ?? ??? ????? ??????.  ??? ????? ??? ???? ??? ?? ??? ??? ???? ?? ????? ???? ???? 12 ???? ??????? ????? ???? ? ??????? ????? ?? ??? ??? ??/???? ?? ???? ??? ??? ??? ?? ??? ?? ????? ????? ?? ???? ???? ?? ??? ????? ???? ??????? ?? ???? ???? ????? ???? ??? ?? ??? ???? ??? ?? ??? ????? ???? ???? ????? ? ???/??? ??? ?????? ??? ???.  ???? ????? ???? ?? 3 ?? 4 ??? ????? ?? ????? ????? ?????? ??? ????? ??? ?????? ????. shma yek ofont mojraye edrari darid. man npamchenan goman mikonam keh shma yek sang [place] darid.  enti biotik Keflex  ra npar 12 saat baraye 7 ruz masraf konid.  ma emruz dar kolinik bah shma yek vaksin kitorolak dadim ta dar kamar dard shma kamak kand.  shma mitvanid npar 6 saat taylenol ra torikeh baraye dard niaz est masraf konid.  ta zamanikeh dard kamar/shkam shma bah alat sang [place] moshkok bacpehbud yabad rozaneh 1 ghors tamsolosin [employer]. tamsolosin mitvand sabeb sargichi gardad az inro motmaen shvid keh shma ab zyad minoshid ta khob abdar bemanid.  eger alim shma badtar shod ya eger shma ghadar bah edrar kardan baraye 12 saat nabashid, besiar tahavo ve estefragh konid, ya eger shma tab/shodid ya badtar shodan dard shkam ya kamar ra ijad konid, man niaz daram keh shma foran baraye arzyabi bah otagh ajel barvid zira in bah in [place] est keh shma ofont shodid [place] darid ve sang/sang shma ensedad shodeh est.  baraye barresi mojdad dar 3 ta 4 ruz ayandeh ba araeh dahandeh maraghbat cpehei oliyeh khod peygiri  konid.  You have a urinary tract infection. I also suspect that you have a kidney stone.  Take Keflex  antibiotic every 12 hours for 7 days.  We gave you a shot of ketorolac  in the clinic today to help with your low back pain.  You may take Tylenol  every 6 hours as needed for pain.  Take tamsulosin  1 pill daily until your low back/abdominal pain improves due to suspected kidney stone. Tamsulosin  can cause some dizziness so make sure that you are drinking plenty of water to stay well-hydrated.  If your symptoms worsen or if you are not able to pass any urine for 12 hours, become very nauseous and throw up, or if you develop fever/severe or worsening abdominal or lower back pain, I need you to go to the emergency room immediately for evaluation as this could mean that you have a severe kidney infection and/or your stone has become obstructed.  Follow-up with your primary care provider in the next 3 to 4 days for recheck.

## 2024-03-04 ENCOUNTER — Ambulatory Visit (HOSPITAL_COMMUNITY): Payer: Self-pay

## 2024-03-04 LAB — CERVICOVAGINAL ANCILLARY ONLY
Comment: NEGATIVE
Comment: NEGATIVE
Comment: NEGATIVE

## 2024-03-05 LAB — URINE CULTURE: Culture: >=100000 — AB

## 2024-03-13 ENCOUNTER — Ambulatory Visit: Admitting: Family Medicine

## 2024-03-22 DIAGNOSIS — Z419 Encounter for procedure for purposes other than remedying health state, unspecified: Secondary | ICD-10-CM | POA: Diagnosis not present
